# Patient Record
Sex: Female | Born: 1960 | Race: Black or African American | Hispanic: No | Smoking: Current every day smoker
Health system: Southern US, Community
[De-identification: ages and names within clinical notes are randomized; demographics above are authoritative.]

## PROBLEM LIST (undated history)

## (undated) DIAGNOSIS — Z87442 Personal history of urinary calculi: Secondary | ICD-10-CM

## (undated) DIAGNOSIS — F329 Major depressive disorder, single episode, unspecified: Secondary | ICD-10-CM

## (undated) DIAGNOSIS — M179 Osteoarthritis of knee, unspecified: Secondary | ICD-10-CM

## (undated) DIAGNOSIS — F32A Depression, unspecified: Secondary | ICD-10-CM

## (undated) DIAGNOSIS — Z8701 Personal history of pneumonia (recurrent): Secondary | ICD-10-CM

## (undated) DIAGNOSIS — M1710 Unilateral primary osteoarthritis, unspecified knee: Secondary | ICD-10-CM

## (undated) DIAGNOSIS — M2550 Pain in unspecified joint: Secondary | ICD-10-CM

## (undated) HISTORY — PX: BACK SURGERY: SHX140

---

## 1989-08-18 HISTORY — PX: ECTOPIC PREGNANCY SURGERY: SHX613

## 1997-09-29 ENCOUNTER — Inpatient Hospital Stay (HOSPITAL_COMMUNITY): Admission: AD | Admit: 1997-09-29 | Discharge: 1997-09-30 | Payer: Self-pay | Admitting: *Deleted

## 1997-11-08 ENCOUNTER — Inpatient Hospital Stay (HOSPITAL_COMMUNITY): Admission: AD | Admit: 1997-11-08 | Discharge: 1997-11-08 | Payer: Self-pay | Admitting: Obstetrics

## 1997-11-16 ENCOUNTER — Encounter: Admission: RE | Admit: 1997-11-16 | Discharge: 1998-02-14 | Payer: Self-pay | Admitting: Obstetrics

## 1997-12-04 ENCOUNTER — Inpatient Hospital Stay (HOSPITAL_COMMUNITY): Admission: AD | Admit: 1997-12-04 | Discharge: 1997-12-04 | Payer: Self-pay | Admitting: *Deleted

## 1997-12-29 ENCOUNTER — Inpatient Hospital Stay (HOSPITAL_COMMUNITY): Admission: AD | Admit: 1997-12-29 | Discharge: 1997-12-29 | Payer: Self-pay | Admitting: *Deleted

## 1998-01-03 ENCOUNTER — Ambulatory Visit (HOSPITAL_COMMUNITY): Admission: RE | Admit: 1998-01-03 | Discharge: 1998-01-03 | Payer: Self-pay | Admitting: Obstetrics

## 1998-12-20 ENCOUNTER — Inpatient Hospital Stay (HOSPITAL_COMMUNITY): Admission: AD | Admit: 1998-12-20 | Discharge: 1998-12-20 | Payer: Self-pay | Admitting: Obstetrics & Gynecology

## 1999-04-22 ENCOUNTER — Encounter: Payer: Self-pay | Admitting: Emergency Medicine

## 1999-04-22 ENCOUNTER — Emergency Department (HOSPITAL_COMMUNITY): Admission: EM | Admit: 1999-04-22 | Discharge: 1999-04-22 | Payer: Self-pay | Admitting: Emergency Medicine

## 2000-01-23 ENCOUNTER — Emergency Department (HOSPITAL_COMMUNITY): Admission: EM | Admit: 2000-01-23 | Discharge: 2000-01-23 | Payer: Self-pay | Admitting: Emergency Medicine

## 2000-03-09 ENCOUNTER — Emergency Department (HOSPITAL_COMMUNITY): Admission: EM | Admit: 2000-03-09 | Discharge: 2000-03-09 | Payer: Self-pay | Admitting: Emergency Medicine

## 2000-06-07 ENCOUNTER — Inpatient Hospital Stay (HOSPITAL_COMMUNITY): Admission: AD | Admit: 2000-06-07 | Discharge: 2000-06-07 | Payer: Self-pay | Admitting: Obstetrics

## 2000-09-20 ENCOUNTER — Emergency Department (HOSPITAL_COMMUNITY): Admission: EM | Admit: 2000-09-20 | Discharge: 2000-09-21 | Payer: Self-pay | Admitting: Internal Medicine

## 2001-06-25 ENCOUNTER — Emergency Department (HOSPITAL_COMMUNITY): Admission: EM | Admit: 2001-06-25 | Discharge: 2001-06-25 | Payer: Self-pay

## 2001-08-25 ENCOUNTER — Inpatient Hospital Stay (HOSPITAL_COMMUNITY): Admission: AD | Admit: 2001-08-25 | Discharge: 2001-08-25 | Payer: Self-pay | Admitting: Obstetrics

## 2001-09-25 ENCOUNTER — Encounter: Payer: Self-pay | Admitting: Emergency Medicine

## 2001-09-25 ENCOUNTER — Emergency Department (HOSPITAL_COMMUNITY): Admission: EM | Admit: 2001-09-25 | Discharge: 2001-09-25 | Payer: Self-pay | Admitting: Emergency Medicine

## 2001-10-11 ENCOUNTER — Emergency Department (HOSPITAL_COMMUNITY): Admission: EM | Admit: 2001-10-11 | Discharge: 2001-10-12 | Payer: Self-pay | Admitting: Emergency Medicine

## 2001-10-22 ENCOUNTER — Inpatient Hospital Stay (HOSPITAL_COMMUNITY): Admission: AD | Admit: 2001-10-22 | Discharge: 2001-10-22 | Payer: Self-pay | Admitting: *Deleted

## 2001-10-25 ENCOUNTER — Emergency Department (HOSPITAL_COMMUNITY): Admission: EM | Admit: 2001-10-25 | Discharge: 2001-10-25 | Payer: Self-pay | Admitting: Emergency Medicine

## 2001-10-25 ENCOUNTER — Encounter: Payer: Self-pay | Admitting: Emergency Medicine

## 2001-12-10 ENCOUNTER — Ambulatory Visit (HOSPITAL_COMMUNITY): Admission: RE | Admit: 2001-12-10 | Discharge: 2001-12-10 | Payer: Self-pay | Admitting: *Deleted

## 2001-12-14 ENCOUNTER — Emergency Department (HOSPITAL_COMMUNITY): Admission: EM | Admit: 2001-12-14 | Discharge: 2001-12-14 | Payer: Self-pay | Admitting: Emergency Medicine

## 2002-01-03 ENCOUNTER — Ambulatory Visit (HOSPITAL_COMMUNITY): Admission: RE | Admit: 2002-01-03 | Discharge: 2002-01-03 | Payer: Self-pay | Admitting: *Deleted

## 2002-01-23 ENCOUNTER — Inpatient Hospital Stay (HOSPITAL_COMMUNITY): Admission: AD | Admit: 2002-01-23 | Discharge: 2002-01-23 | Payer: Self-pay | Admitting: *Deleted

## 2002-03-09 ENCOUNTER — Ambulatory Visit (HOSPITAL_COMMUNITY): Admission: RE | Admit: 2002-03-09 | Discharge: 2002-03-09 | Payer: Self-pay | Admitting: *Deleted

## 2002-04-10 ENCOUNTER — Inpatient Hospital Stay (HOSPITAL_COMMUNITY): Admission: AD | Admit: 2002-04-10 | Discharge: 2002-04-10 | Payer: Self-pay | Admitting: *Deleted

## 2002-05-13 ENCOUNTER — Inpatient Hospital Stay (HOSPITAL_COMMUNITY): Admission: AD | Admit: 2002-05-13 | Discharge: 2002-05-13 | Payer: Self-pay | Admitting: *Deleted

## 2002-05-21 ENCOUNTER — Inpatient Hospital Stay (HOSPITAL_COMMUNITY): Admission: AD | Admit: 2002-05-21 | Discharge: 2002-05-23 | Payer: Self-pay | Admitting: *Deleted

## 2003-09-19 ENCOUNTER — Emergency Department (HOSPITAL_COMMUNITY): Admission: EM | Admit: 2003-09-19 | Discharge: 2003-09-19 | Payer: Self-pay | Admitting: Emergency Medicine

## 2003-11-19 ENCOUNTER — Emergency Department (HOSPITAL_COMMUNITY): Admission: EM | Admit: 2003-11-19 | Discharge: 2003-11-19 | Payer: Self-pay | Admitting: Emergency Medicine

## 2004-06-06 ENCOUNTER — Emergency Department (HOSPITAL_COMMUNITY): Admission: EM | Admit: 2004-06-06 | Discharge: 2004-06-07 | Payer: Self-pay | Admitting: Emergency Medicine

## 2005-05-23 ENCOUNTER — Emergency Department (HOSPITAL_COMMUNITY): Admission: EM | Admit: 2005-05-23 | Discharge: 2005-05-23 | Payer: Self-pay | Admitting: Emergency Medicine

## 2005-05-29 ENCOUNTER — Emergency Department (HOSPITAL_COMMUNITY): Admission: EM | Admit: 2005-05-29 | Discharge: 2005-05-29 | Payer: Self-pay | Admitting: Emergency Medicine

## 2007-11-17 ENCOUNTER — Emergency Department (HOSPITAL_COMMUNITY): Admission: EM | Admit: 2007-11-17 | Discharge: 2007-11-17 | Payer: Self-pay | Admitting: Emergency Medicine

## 2007-11-21 ENCOUNTER — Emergency Department (HOSPITAL_COMMUNITY): Admission: EM | Admit: 2007-11-21 | Discharge: 2007-11-21 | Payer: Self-pay | Admitting: Emergency Medicine

## 2008-06-30 ENCOUNTER — Emergency Department (HOSPITAL_COMMUNITY): Admission: EM | Admit: 2008-06-30 | Discharge: 2008-06-30 | Payer: Self-pay | Admitting: Family Medicine

## 2008-11-20 ENCOUNTER — Emergency Department (HOSPITAL_COMMUNITY): Admission: EM | Admit: 2008-11-20 | Discharge: 2008-11-20 | Payer: Self-pay | Admitting: Emergency Medicine

## 2009-01-05 ENCOUNTER — Emergency Department (HOSPITAL_COMMUNITY): Admission: EM | Admit: 2009-01-05 | Discharge: 2009-01-05 | Payer: Self-pay | Admitting: Emergency Medicine

## 2009-03-04 ENCOUNTER — Emergency Department (HOSPITAL_COMMUNITY): Admission: EM | Admit: 2009-03-04 | Discharge: 2009-03-05 | Payer: Self-pay | Admitting: Emergency Medicine

## 2009-04-09 ENCOUNTER — Emergency Department (HOSPITAL_COMMUNITY): Admission: EM | Admit: 2009-04-09 | Discharge: 2009-04-09 | Payer: Self-pay | Admitting: Emergency Medicine

## 2009-04-30 ENCOUNTER — Emergency Department (HOSPITAL_COMMUNITY): Admission: EM | Admit: 2009-04-30 | Discharge: 2009-04-30 | Payer: Self-pay | Admitting: Emergency Medicine

## 2009-05-02 ENCOUNTER — Emergency Department (HOSPITAL_COMMUNITY): Admission: EM | Admit: 2009-05-02 | Discharge: 2009-05-02 | Payer: Self-pay | Admitting: Emergency Medicine

## 2009-06-05 ENCOUNTER — Emergency Department (HOSPITAL_COMMUNITY): Admission: EM | Admit: 2009-06-05 | Discharge: 2009-06-05 | Payer: Self-pay | Admitting: Emergency Medicine

## 2009-06-07 ENCOUNTER — Emergency Department (HOSPITAL_COMMUNITY): Admission: EM | Admit: 2009-06-07 | Discharge: 2009-06-07 | Payer: Self-pay | Admitting: Emergency Medicine

## 2010-03-20 ENCOUNTER — Inpatient Hospital Stay (HOSPITAL_COMMUNITY): Admission: RE | Admit: 2010-03-20 | Discharge: 2010-03-25 | Payer: Self-pay | Admitting: Orthopedic Surgery

## 2010-03-20 HISTORY — PX: LUMBAR FUSION: SHX111

## 2010-04-06 ENCOUNTER — Emergency Department (HOSPITAL_COMMUNITY): Admission: EM | Admit: 2010-04-06 | Discharge: 2010-04-07 | Payer: Self-pay | Admitting: Emergency Medicine

## 2010-10-23 ENCOUNTER — Encounter: Payer: Self-pay | Admitting: Obstetrics and Gynecology

## 2010-10-31 LAB — URINALYSIS, ROUTINE W REFLEX MICROSCOPIC
Bilirubin Urine: NEGATIVE
Glucose, UA: NEGATIVE mg/dL
Ketones, ur: NEGATIVE mg/dL
Nitrite: NEGATIVE
Protein, ur: NEGATIVE mg/dL
Specific Gravity, Urine: 1.026 (ref 1.005–1.030)
Urobilinogen, UA: 1 mg/dL (ref 0.0–1.0)
pH: 7.5 (ref 5.0–8.0)

## 2010-10-31 LAB — URINE MICROSCOPIC-ADD ON

## 2010-11-02 LAB — SURGICAL PCR SCREEN: Staphylococcus aureus: NEGATIVE

## 2010-11-02 LAB — ABO/RH: ABO/RH(D): O POS

## 2010-11-02 LAB — TYPE AND SCREEN: Antibody Screen: NEGATIVE

## 2010-11-02 LAB — CBC
MCV: 97 fL (ref 78.0–100.0)
RDW: 14.1 % (ref 11.5–15.5)

## 2010-11-24 LAB — URINE MICROSCOPIC-ADD ON

## 2010-11-24 LAB — GC/CHLAMYDIA PROBE AMP, GENITAL: Chlamydia, DNA Probe: NEGATIVE

## 2010-11-24 LAB — POCT I-STAT, CHEM 8
Calcium, Ion: 1.16 mmol/L (ref 1.12–1.32)
Chloride: 108 mEq/L (ref 96–112)
Glucose, Bld: 88 mg/dL (ref 70–99)
Potassium: 3.6 mEq/L (ref 3.5–5.1)
Sodium: 145 mEq/L (ref 135–145)
TCO2: 26 mmol/L (ref 0–100)

## 2010-11-24 LAB — URINALYSIS, ROUTINE W REFLEX MICROSCOPIC
Glucose, UA: NEGATIVE mg/dL
Hgb urine dipstick: NEGATIVE
Urobilinogen, UA: 2 mg/dL — ABNORMAL HIGH (ref 0.0–1.0)
pH: 6 (ref 5.0–8.0)

## 2010-11-24 LAB — URINE CULTURE

## 2010-11-24 LAB — DIFFERENTIAL
Basophils Absolute: 0 10*3/uL (ref 0.0–0.1)
Eosinophils Absolute: 0.3 10*3/uL (ref 0.0–0.7)
Eosinophils Relative: 3 % (ref 0–5)
Lymphs Abs: 3.4 10*3/uL (ref 0.7–4.0)
Neutro Abs: 4.2 10*3/uL (ref 1.7–7.7)

## 2010-11-24 LAB — WET PREP, GENITAL
Trich, Wet Prep: NONE SEEN
Yeast Wet Prep HPF POC: NONE SEEN

## 2010-11-24 LAB — CBC
Hemoglobin: 11.8 g/dL — ABNORMAL LOW (ref 12.0–15.0)
MCV: 96.8 fL (ref 78.0–100.0)
RDW: 13.9 % (ref 11.5–15.5)
WBC: 8.6 10*3/uL (ref 4.0–10.5)

## 2011-05-13 LAB — CBC
MCHC: 34
Platelets: 215
RDW: 13.1

## 2011-05-13 LAB — POCT I-STAT, CHEM 8
Chloride: 107
HCT: 35 — ABNORMAL LOW
Hemoglobin: 11.9 — ABNORMAL LOW
Potassium: 3.5

## 2011-05-13 LAB — DIFFERENTIAL
Basophils Absolute: 0.1
Basophils Relative: 0
Neutro Abs: 7.2
Neutrophils Relative %: 60

## 2011-05-24 ENCOUNTER — Emergency Department (HOSPITAL_COMMUNITY)
Admission: EM | Admit: 2011-05-24 | Discharge: 2011-05-24 | Disposition: A | Discharge status: Home / Self Care | Payer: Self-pay | Attending: Emergency Medicine | Admitting: Emergency Medicine

## 2011-05-24 DIAGNOSIS — Z0389 Encounter for observation for other suspected diseases and conditions ruled out: Secondary | ICD-10-CM | POA: Insufficient documentation

## 2012-02-13 ENCOUNTER — Encounter (HOSPITAL_COMMUNITY): Payer: Self-pay

## 2012-02-13 ENCOUNTER — Emergency Department (INDEPENDENT_AMBULATORY_CARE_PROVIDER_SITE_OTHER)
Admission: EM | Admit: 2012-02-13 | Discharge: 2012-02-13 | Disposition: A | Discharge status: Home / Self Care | Payer: Self-pay | Source: Home / Self Care | Attending: Family Medicine | Admitting: Family Medicine

## 2012-02-13 DIAGNOSIS — M25569 Pain in unspecified knee: Secondary | ICD-10-CM

## 2012-02-13 DIAGNOSIS — N898 Other specified noninflammatory disorders of vagina: Secondary | ICD-10-CM

## 2012-02-13 LAB — POCT URINALYSIS DIP (DEVICE)
Glucose, UA: NEGATIVE mg/dL
Ketones, ur: NEGATIVE mg/dL
Leukocytes, UA: NEGATIVE
Specific Gravity, Urine: >=1.03 (ref 1.005–1.030)
Urobilinogen, UA: 0.2 mg/dL (ref 0.0–1.0)

## 2012-02-13 LAB — WET PREP, GENITAL
Trich, Wet Prep: NONE SEEN
WBC, Wet Prep HPF POC: NONE SEEN
Yeast Wet Prep HPF POC: NONE SEEN

## 2012-02-13 NOTE — Discharge Instructions (Signed)
Follow up with your orthopedist as scheduled about your knee.  In the meantime if your knee gets worse, or shows signs of infection, please come back here or go to the ER.   Knee Pain The knee is the complex joint between your thigh and your lower leg. It is made up of bones, tendons, ligaments, and cartilage. The bones that make up the knee are:  The femur in the thigh.   The tibia and fibula in the lower leg.   The patella or kneecap riding in the groove on the lower femur.  CAUSES  Knee pain is a common complaint with many causes. A few of these causes are:  Injury, such as:   A ruptured ligament or tendon injury.   Torn cartilage.   Medical conditions, such as:   Gout   Arthritis   Infections   Overuse, over training or overdoing a physical activity.  Knee pain can be minor or severe. Knee pain can accompany debilitating injury. Minor knee problems often respond well to self-care measures or get well on their own. More serious injuries may need medical intervention or even surgery. SYMPTOMS The knee is complex. Symptoms of knee problems can vary widely. Some of the problems are:  Pain with movement and weight bearing.   Swelling and tenderness.   Buckling of the knee.   Inability to straighten or extend your knee.   Your knee locks and you cannot straighten it.   Warmth and redness with pain and fever.   Deformity or dislocation of the kneecap.  DIAGNOSIS  Determining what is wrong may be very straight forward such as when there is an injury. It can also be challenging because of the complexity of the knee. Tests to make a diagnosis may include:  Your caregiver taking a history and doing a physical exam.   Routine X-rays can be used to rule out other problems. X-rays will not reveal a cartilage tear. Some injuries of the knee can be diagnosed by:   Arthroscopy a surgical technique by which a small video camera is inserted through tiny incisions on the sides of  the knee. This procedure is used to examine and repair internal knee joint problems. Tiny instruments can be used during arthroscopy to repair the torn knee cartilage (meniscus).   Arthrography is a radiology technique. A contrast liquid is directly injected into the knee joint. Internal structures of the knee joint then become visible on X-ray film.   An MRI scan is a non x-ray radiology procedure in which magnetic fields and a computer produce two- or three-dimensional images of the inside of the knee. Cartilage tears are often visible using an MRI scanner. MRI scans have largely replaced arthrography in diagnosing cartilage tears of the knee.   Blood work.   Examination of the fluid that helps to lubricate the knee joint (synovial fluid). This is done by taking a sample out using a needle and a syringe.  TREATMENT The treatment of knee problems depends on the cause. Some of these treatments are:  Depending on the injury, proper casting, splinting, surgery or physical therapy care will be needed.   Give yourself adequate recovery time. Do not overuse your joints. If you begin to get sore during workout routines, back off. Slow down or do fewer repetitions.   For repetitive activities such as cycling or running, maintain your strength and nutrition.   Alternate muscle groups. For example if you are a weight lifter, work the upper body  on one day and the lower body the next.   Either tight or weak muscles do not give the proper support for your knee. Tight or weak muscles do not absorb the stress placed on the knee joint. Keep the muscles surrounding the knee strong.   Take care of mechanical problems.   If you have flat feet, orthotics or special shoes may help. See your caregiver if you need help.   Arch supports, sometimes with wedges on the inner or outer aspect of the heel, can help. These can shift pressure away from the side of the knee most bothered by osteoarthritis.   A brace  called an "unloader" brace also may be used to help ease the pressure on the most arthritic side of the knee.   If your caregiver has prescribed crutches, braces, wraps or ice, use as directed. The acronym for this is PRICE. This means protection, rest, ice, compression and elevation.   Nonsteroidal anti-inflammatory drugs (NSAID's), can help relieve pain. But if taken immediately after an injury, they may actually increase swelling. Take NSAID's with food in your stomach. Stop them if you develop stomach problems. Do not take these if you have a history of ulcers, stomach pain or bleeding from the bowel. Do not take without your caregiver's approval if you have problems with fluid retention, heart failure, or kidney problems.   For ongoing knee problems, physical therapy may be helpful.   Glucosamine and chondroitin are over-the-counter dietary supplements. Both may help relieve the pain of osteoarthritis in the knee. These medicines are different from the usual anti-inflammatory drugs. Glucosamine may decrease the rate of cartilage destruction.   Injections of a corticosteroid drug into your knee joint may help reduce the symptoms of an arthritis flare-up. They may provide pain relief that lasts a few months. You may have to wait a few months between injections. The injections do have a small increased risk of infection, water retention and elevated blood sugar levels.   Hyaluronic acid injected into damaged joints may ease pain and provide lubrication. These injections may work by reducing inflammation. A series of shots may give relief for as long as 6 months.   Topical painkillers. Applying certain ointments to your skin may help relieve the pain and stiffness of osteoarthritis. Ask your pharmacist for suggestions. Many over the-counter products are approved for temporary relief of arthritis pain.   In some countries, doctors often prescribe topical NSAID's for relief of chronic conditions such  as arthritis and tendinitis. A review of treatment with NSAID creams found that they worked as well as oral medications but without the serious side effects.  PREVENTION  Maintain a healthy weight. Extra pounds put more strain on your joints.   Get strong, stay limber. Weak muscles are a common cause of knee injuries. Stretching is important. Include flexibility exercises in your workouts.   Be smart about exercise. If you have osteoarthritis, chronic knee pain or recurring injuries, you may need to change the way you exercise. This does not mean you have to stop being active. If your knees ache after jogging or playing basketball, consider switching to swimming, water aerobics or other low-impact activities, at least for a few days a week. Sometimes limiting high-impact activities will provide relief.   Make sure your shoes fit well. Choose footwear that is right for your sport.   Protect your knees. Use the proper gear for knee-sensitive activities. Use kneepads when playing volleyball or laying carpet. Buckle your seat belt  every time you drive. Most shattered kneecaps occur in car accidents.   Rest when you are tired.  SEEK MEDICAL CARE IF:  You have knee pain that is continual and does not seem to be getting better.  SEEK IMMEDIATE MEDICAL CARE IF:  Your knee joint feels hot to the touch and you have a high fever. MAKE SURE YOU:   Understand these instructions.   Will watch your condition.   Will get help right away if you are not doing well or get worse.  Document Released: 06/01/2007 Document Revised: 07/24/2011 Document Reviewed: 06/01/2007 [facility] Patient Information 2012 [place], [place].

## 2012-02-13 NOTE — ED Notes (Signed)
pts says her home telephone number is [phone].

## 2012-02-13 NOTE — ED Notes (Signed)
States she noted heavy vaginal d/c this AM when she was taking her shower; c/o pain in lower back

## 2012-02-14 LAB — GC/CHLAMYDIA PROBE AMP, GENITAL: GC Probe Amp, Genital: NEGATIVE

## 2012-02-15 NOTE — ED Provider Notes (Signed)
History     CSN: [id number]  Arrival date & time 02/13/12  1810   First MD Initiated Contact with Patient 02/13/12 1941      Chief Complaint  Patient presents with  . Vaginitis    (Consider location/radiation/quality/duration/timing/severity/associated sxs/prior treatment) HPI Comments: Pt with chronic back pain, thinks she has changed the way she walks to accommodate back pain, causing pain to R knee.  Denies injury or fall, first noticed slight pain in knee 2 weeks ago, has been progressively getting worse and in last few days she has noticed it is swollen.  No redness, no fever.   Pt reports sexual activity is rare for her, did have sex last night; previous activity was about 3 months ago.  Worried she may have STD because this morning when she got into shower, she noticed a "gush" of discharge, "like my water broke but I'm not pregnant".  Describes discharge as thin and white.   Patient is a 51 y.o. female presenting with knee pain and vaginal discharge. The history is provided by the patient.  Knee Pain This is a new problem. Episode onset: 2 weeks ago. The problem occurs constantly. The problem has been gradually worsening. Pertinent negatives include no abdominal pain. The symptoms are aggravated by bending and walking. Nothing relieves the symptoms. She has tried nothing for the symptoms.  Vaginal Discharge This is a new problem. The current episode started 6 to 12 hours ago. Episode frequency: x1 today. The problem has been resolved. Pertinent negatives include no abdominal pain. Nothing aggravates the symptoms. Nothing relieves the symptoms. She has tried nothing for the symptoms.    History reviewed. No pertinent past medical history.  History reviewed. No pertinent past surgical history.  History reviewed. No pertinent family history.  History  Substance Use Topics  . Smoking status: Current Some Day Smoker  . Smokeless tobacco: Not on file  . Alcohol Use: No    OB  History    Grav Para Term Preterm Abortions TAB SAB Ect Mult Living                  Review of Systems  Constitutional: Negative for fever and chills.  Gastrointestinal: Negative for nausea, vomiting and abdominal pain.  Genitourinary: Positive for vaginal discharge. Negative for dysuria, urgency, frequency, flank pain, vaginal bleeding, genital sores and vaginal pain.       Pt postmenopausal; menses ceased around age 56.  Musculoskeletal: Positive for joint swelling.       Chronic back pain, R knee pain; no injury or fall  Skin: Negative for color change.  Neurological: Negative for weakness and numbness.    Allergies  Review of patient's allergies indicates no known allergies.  Home Medications  No current outpatient prescriptions on file.  BP 148/99  Pulse 84  Temp 97.4 F (36.3 C) (Oral)  Resp 18  SpO2 100%  Physical Exam  Constitutional: She appears well-developed and well-nourished. No distress.       obese  Cardiovascular: Normal rate and regular rhythm.   Pulmonary/Chest: Effort normal and breath sounds normal.  Abdominal: Soft. Bowel sounds are normal. She exhibits no distension. There is no tenderness. There is no rigidity, no rebound, no guarding and no CVA tenderness.  Genitourinary: There is no rash, tenderness or lesion on the right labia. There is no rash, tenderness or lesion on the left labia. Cervix exhibits no motion tenderness, no discharge and no friability. Right adnexum displays no mass and no tenderness. Left  adnexum displays no mass and no tenderness. No erythema, tenderness or bleeding around the vagina. No foreign body around the vagina. No signs of injury around the vagina. No vaginal discharge found.       Uterus and ovaries non palpable, likely due to body habitus.   Musculoskeletal:       Right knee: She exhibits swelling. She exhibits normal range of motion, no deformity, no erythema and no bony tenderness. tenderness found.  Lymphadenopathy:        Right: No inguinal adenopathy present.       Left: No inguinal adenopathy present.  Skin: Skin is warm and dry. No abrasion, no bruising and no rash noted. No erythema.       Slight warmth of medial R knee compared to L.  No evidence infection.     ED Course  Procedures (including critical care time)  Labs Reviewed  POCT URINALYSIS DIP (DEVICE) - Abnormal; Notable for the following:    Bilirubin Urine SMALL (*)     Hgb urine dipstick SMALL (*)     All other components within normal limits  WET PREP, GENITAL - Abnormal; Notable for the following:    Clue Cells Wet Prep HPF POC FEW (*)     All other components within normal limits  GC/CHLAMYDIA PROBE AMP, GENITAL   No results found.   1. Vaginal Discharge   2. Knee pain       MDM  Discussed with Dr. Alfonse Ras.  Likely R knee effusion. Pt refused knee xray and blood work, stating "I think this is because of my back, so I'm going to talk to my orthopedist about it when I see him this week so that the cost will be covered by worker's comp".  Reviewed with pt concerning sx to watch for in her knee and to go to the ER if she develops any.    No discharge noted in vagina.  Given pt's sexual history, STD seems unlikely.  Probably semen and cervical fluids retained in vagina post-coitus yesterday, and they discharged when she stood in shower.          Cathlyn Parsons, NP 02/15/12 1119

## 2012-02-17 NOTE — ED Provider Notes (Signed)
Medical screening examination/treatment/procedure(s) were performed by non-physician practitioner and as supervising physician I was immediately available for consultation/collaboration.   [facility]; MD   Sharin Grave, MD 02/17/12 [phone]

## 2012-03-03 ENCOUNTER — Emergency Department (HOSPITAL_COMMUNITY): Payer: Medicare Other

## 2012-03-03 ENCOUNTER — Encounter (HOSPITAL_COMMUNITY): Payer: Self-pay

## 2012-03-03 ENCOUNTER — Emergency Department (HOSPITAL_COMMUNITY)
Admission: EM | Admit: 2012-03-03 | Discharge: 2012-03-03 | Disposition: A | Discharge status: Home / Self Care | Payer: Medicare Other | Attending: Emergency Medicine | Admitting: Emergency Medicine

## 2012-03-03 DIAGNOSIS — F17200 Nicotine dependence, unspecified, uncomplicated: Secondary | ICD-10-CM | POA: Insufficient documentation

## 2012-03-03 DIAGNOSIS — M795 Residual foreign body in soft tissue: Secondary | ICD-10-CM

## 2012-03-03 DIAGNOSIS — IMO0002 Reserved for concepts with insufficient information to code with codable children: Secondary | ICD-10-CM | POA: Insufficient documentation

## 2012-03-03 MED ORDER — LIDOCAINE HCL 2 % IJ SOLN
10.0000 mL | Freq: Once | INTRAMUSCULAR | Status: AC
  Administered 2012-03-03: 20 mg via INTRADERMAL
  Filled 2012-03-03: qty 1

## 2012-03-03 MED ORDER — IBUPROFEN 600 MG PO TABS: 600.0000 mg | ORAL_TABLET | Freq: Four times a day (QID) | ORAL | Status: AC | PRN

## 2012-03-03 MED ORDER — AMOXICILLIN-POT CLAVULANATE 250-62.5 MG/5ML PO SUSR: 250.0000 mg | Freq: Three times a day (TID) | ORAL | Status: AC

## 2012-03-03 MED ORDER — TETANUS-DIPHTH-ACELL PERTUSSIS 5-2.5-18.5 LF-MCG/0.5 IM SUSP
0.5000 mL | Freq: Once | INTRAMUSCULAR | Status: AC
  Administered 2012-03-03: 0.5 mL via INTRAMUSCULAR
  Filled 2012-03-03: qty 0.5

## 2012-03-03 MED ORDER — OXYCODONE-ACETAMINOPHEN 5-325 MG PO TABS: 2.0000 | ORAL_TABLET | ORAL | Status: AC | PRN

## 2012-03-03 NOTE — ED Notes (Signed)
Pt reports stepping on something July 4th, pt presents w/swelling and black area to the bottom of her (L) foot/arch. Pt is unsure what she stepped on.

## 2012-03-03 NOTE — ED Provider Notes (Signed)
History     CSN: [id number]  Arrival date & time 03/03/12  7829   First MD Initiated Contact with Patient 03/03/12 [phone]      Chief Complaint  Patient presents with  . Foot Pain    (Consider location/radiation/quality/duration/timing/severity/associated sxs/prior treatment) Patient is a 51 y.o. female presenting with lower extremity pain. The history is provided by the patient. No language interpreter was used.  Foot Pain The current episode started in the past 7 days. The problem occurs daily. The problem has been gradually worsening. Pertinent negatives include no chills, fever, nausea, neck pain, vomiting or weakness. The symptoms are aggravated by walking. She has tried nothing for the symptoms.   51 year old female here today with complaint of L foot pain. States on July 4th she stepped on something and since then the pain has gotten worse with swelling.  States that she thinks something is in her foot. Edema noted no erythema or drainage.   Patient moves foot with no difficulty.  2+ pedal pulse.    History reviewed. No pertinent past medical history.  Past Surgical History  Procedure Date  . Back surgery     History reviewed. No pertinent family history.  History  Substance Use Topics  . Smoking status: Current Some Day Smoker  . Smokeless tobacco: Not on file  . Alcohol Use: No    OB History    Grav Para Term Preterm Abortions TAB SAB Ect Mult Living                  Review of Systems  Constitutional: Negative.  Negative for fever and chills.  HENT: Negative.  Negative for neck pain and neck stiffness.   Eyes: Negative.   Respiratory: Negative.   Cardiovascular: Negative.   Gastrointestinal: Negative.  Negative for nausea and vomiting.  Musculoskeletal: Positive for gait problem. Negative for back pain.       L foot pain  Neurological: Negative.  Negative for weakness.  Psychiatric/Behavioral: Negative.   All other systems reviewed and are  negative.    Allergies  Review of patient's allergies indicates no known allergies.  Home Medications  No current outpatient prescriptions on file.  BP 144/93  Pulse 78  Temp 98.4 F (36.9 C) (Oral)  Resp 18  SpO2 100%  Physical Exam  Nursing note and vitals reviewed. Constitutional: She is oriented to person, place, and time. She appears well-developed and well-nourished.  HENT:  Head: Normocephalic.  Eyes: Conjunctivae and EOM are normal. Pupils are equal, round, and reactive to light.  Neck: Normal range of motion. Neck supple.  Cardiovascular: Normal rate.   Pulmonary/Chest: Effort normal.  Abdominal: Soft.  Musculoskeletal: Normal range of motion.       L foot edema to the dorsal mid foot with visible foreign object  Neurological: She is alert and oriented to person, place, and time.  Skin: Skin is warm and dry.  Psychiatric: She has a normal mood and affect.    ED Course  Procedures (including critical care time)  Labs Reviewed - No data to display No results found.   No diagnosis found.    MDM  Attempted to remove foreign object x 2 from L foot.    Patient unable to hold still or receive enough lidocaine for sufficient anesthesia for the procedure.  Tetanus updated.  Patient wants to follow up with her orthopedic.  Rx for pain meds and antibiotics.  Crutches provided.  Shared visit with Dr. Preston Fleeting.  Remi Haggard, NP 03/03/12 1016

## 2012-03-03 NOTE — ED Notes (Signed)
Patient transported to X-ray 

## 2012-03-03 NOTE — ED Provider Notes (Signed)
51 year old female with a foreign body in her left foot for the last 2 weeks. It is visible on x-ray and images were viewed by me. And Crawford NP had initially tried to move the foreign body but was unable to grasp it. I went in to I attempted to remove the foreign body. Anesthetic had worn off at that point and patient was refusing to every injection of anesthetic and refusing attempt to remove the foreign body. Accordingly, she will be referred to orthopedics. She seems to get an adequate anesthesia from the local anesthetic and may need sedation and/or general anesthesia to get the foreign body extracted.  Dione Booze, MD 03/03/12 [phone]

## 2012-03-03 NOTE — Progress Notes (Signed)
Orthopedic Tech Progress Note Patient Details:  Martha Buck October 04, 1960 [id number]  Ortho Devices Type of Ortho Device: Crutches Ortho Device/Splint Interventions: Application   [name] 03/03/2012, 8:26 AM

## 2012-03-03 NOTE — ED Notes (Signed)
Pt reports L foot pain 

## 2012-03-05 NOTE — ED Provider Notes (Signed)
Medical screening examination/treatment/procedure(s) were conducted as a shared visit with non-physician practitioner(s) and myself.  I personally evaluated the patient during the encounter   Jarrett Chicoine, MD 03/05/12 1432 

## 2012-05-02 ENCOUNTER — Encounter (HOSPITAL_COMMUNITY): Payer: Self-pay

## 2012-05-02 ENCOUNTER — Emergency Department (INDEPENDENT_AMBULATORY_CARE_PROVIDER_SITE_OTHER)
Admission: EM | Admit: 2012-05-02 | Discharge: 2012-05-02 | Disposition: A | Discharge status: Home / Self Care | Payer: Medicare Other | Source: Home / Self Care | Attending: Family Medicine | Admitting: Family Medicine

## 2012-05-02 DIAGNOSIS — T63441A Toxic effect of venom of bees, accidental (unintentional), initial encounter: Secondary | ICD-10-CM

## 2012-05-02 DIAGNOSIS — T6391XA Toxic effect of contact with unspecified venomous animal, accidental (unintentional), initial encounter: Secondary | ICD-10-CM

## 2012-05-02 MED ORDER — KETOROLAC TROMETHAMINE 30 MG/ML IJ SOLN
30.0000 mg | Freq: Once | INTRAMUSCULAR | Status: AC
  Administered 2012-05-02: 30 mg via INTRAMUSCULAR

## 2012-05-02 MED ORDER — KETOROLAC TROMETHAMINE 30 MG/ML IJ SOLN
INTRAMUSCULAR | Status: AC
  Filled 2012-05-02: qty 1

## 2012-05-02 NOTE — ED Notes (Addendum)
Pt was stung by a bee on lt middle finger 20 minutes ago, swollen and throbbing.  No sob or difficulty swallowing.

## 2012-05-02 NOTE — ED Provider Notes (Signed)
History     CSN: [id number]  Arrival date & time 05/02/12  1345   First MD Initiated Contact with Patient 05/02/12 1352      Chief Complaint  Patient presents with  . Insect Bite    (Consider location/radiation/quality/duration/timing/severity/associated sxs/prior treatment) Patient is a 51 y.o. female presenting with hand pain. The history is provided by the patient.  Hand Pain This is a new problem. The current episode started less than 1 hour ago (bee sting to finger--pain and local swelling, when taking out garbage.). The problem has not changed since onset.Pertinent negatives include no chest pain and no shortness of breath. Nothing aggravates the symptoms.    History reviewed. No pertinent past medical history.  Past Surgical History  Procedure Date  . Back surgery     History reviewed. No pertinent family history.  History  Substance Use Topics  . Smoking status: Current Some Day Smoker  . Smokeless tobacco: Not on file  . Alcohol Use: No    OB History    Grav Para Term Preterm Abortions TAB SAB Ect Mult Living                  Review of Systems  Constitutional: Negative.   Respiratory: Negative for shortness of breath.   Cardiovascular: Negative for chest pain.  Musculoskeletal: Positive for joint swelling.  Skin: Positive for wound.    Allergies  Review of patient's allergies indicates no known allergies.  Home Medications   Current Outpatient Rx  Name Route Sig Dispense Refill  . DULOXETINE HCL 60 MG PO CPEP Oral Take 60 mg by mouth 2 (two) times daily.    Marland Kitchen METHOCARBAMOL 500 MG PO TABS Oral Take 500 mg by mouth 2 (two) times daily.      BP 114/65  Pulse 70  Temp 98.2 F (36.8 C) (Oral)  Resp 20  SpO2 99%  LMP 04/26/2012  Physical Exam  Nursing note and vitals reviewed. Constitutional: She is oriented to person, place, and time. She appears well-developed and well-nourished.  Cardiovascular: Normal rate, normal heart sounds and intact  distal pulses.   Pulmonary/Chest: Breath sounds normal.  Musculoskeletal: She exhibits tenderness.       Local sts to distal phalanx of lmf.otherwise neg.  Neurological: She is alert and oriented to person, place, and time.  Skin: Skin is warm and dry. No rash noted.    ED Course  Procedures (including critical care time)  Labs Reviewed - No data to display No results found.   1. Local reaction to bee sting       MDM          Linna Hoff, MD 05/02/12 1455

## 2012-06-21 ENCOUNTER — Encounter (HOSPITAL_COMMUNITY): Payer: Self-pay | Admitting: Emergency Medicine

## 2012-06-21 ENCOUNTER — Emergency Department (HOSPITAL_COMMUNITY)
Admission: EM | Admit: 2012-06-21 | Discharge: 2012-06-21 | Disposition: A | Discharge status: Home / Self Care | Payer: Medicare Other | Attending: Emergency Medicine | Admitting: Emergency Medicine

## 2012-06-21 DIAGNOSIS — J020 Streptococcal pharyngitis: Secondary | ICD-10-CM | POA: Insufficient documentation

## 2012-06-21 DIAGNOSIS — F17200 Nicotine dependence, unspecified, uncomplicated: Secondary | ICD-10-CM | POA: Insufficient documentation

## 2012-06-21 MED ORDER — LIDOCAINE VISCOUS 2 % MT SOLN
20.0000 mL | Freq: Once | OROMUCOSAL | Status: AC
  Administered 2012-06-21: 20 mL via OROMUCOSAL
  Filled 2012-06-21: qty 15

## 2012-06-21 MED ORDER — DEXAMETHASONE 1 MG/ML PO CONC
10.0000 mg | Freq: Once | ORAL | Status: AC
  Administered 2012-06-21: 10 mg via ORAL
  Filled 2012-06-21: qty 10

## 2012-06-21 MED ORDER — ACETAMINOPHEN 160 MG/5ML PO SOLN
650.0000 mg | Freq: Once | ORAL | Status: AC
  Administered 2012-06-21: 650 mg via ORAL

## 2012-06-21 MED ORDER — PENICILLIN G BENZATHINE 1200000 UNIT/2ML IM SUSP
1.2000 10*6.[IU] | Freq: Once | INTRAMUSCULAR | Status: AC
  Administered 2012-06-21: 1.2 10*6.[IU] via INTRAMUSCULAR
  Filled 2012-06-21: qty 2

## 2012-06-21 NOTE — ED Notes (Signed)
Pt c/o sore throat with fever x 5 days; pt sts unable to eat due to pain

## 2012-06-21 NOTE — ED Provider Notes (Signed)
History   This chart was scribed for Gerhard Munch, MD by Gerlean Ren. This patient was seen in room TR10C/TR10C and the patient's care was started at 9:28 PM .   CSN: [id number]  Arrival date & time 06/21/12  1832   None     Chief Complaint  Patient presents with  . Sore Throat    (Consider location/radiation/quality/duration/timing/severity/associated sxs/prior treatment) The history is provided by the patient. No language interpreter was used.   Martha Buck is a 51 y.o. female who presents to the Emergency Department complaining of 4 days of constant, gradually worsening sore throat causing decreased food intake due to pain swallowing.  Pt reports associated cough and fever as high as 102.  Pt denies dizziness and light-headedness.  Pt has no h/o chronic medical conditions.  History reviewed. No pertinent past medical history.  Past Surgical History  Procedure Date  . Back surgery     History reviewed. No pertinent family history.  History  Substance Use Topics  . Smoking status: Current Some Day Smoker  . Smokeless tobacco: Not on file  . Alcohol Use: No    No OB history provided.  Review of Systems  Constitutional: Positive for fever.       Per HPI, otherwise negative  HENT: Positive for sore throat.        Per HPI, otherwise negative  Eyes: Negative.   Respiratory:       Per HPI, otherwise negative  Cardiovascular:       Per HPI, otherwise negative  Gastrointestinal: Negative for vomiting.  Genitourinary: Negative.   Musculoskeletal:       Per HPI, otherwise negative  Skin: Negative.   Neurological: Negative for syncope.    Allergies  Review of patient's allergies indicates no known allergies.  Home Medications   Current Outpatient Rx  Name  Route  Sig  Dispense  Refill  . DULOXETINE HCL 60 MG PO CPEP   Oral   Take 60 mg by mouth 2 (two) times daily.         Marland Kitchen HYDROCODONE-ACETAMINOPHEN 5-325 MG PO TABS   Oral   Take 1 tablet by mouth  every 6 (six) hours as needed. For pain         . METHOCARBAMOL 500 MG PO TABS   Oral   Take 500 mg by mouth 2 (two) times daily.           BP 109/77  Pulse 113  Temp 99 F (37.2 C) (Oral)  Resp 20  SpO2 97%  LMP 04/26/2012  Physical Exam  Nursing note and vitals reviewed. Constitutional: She is oriented to person, place, and time. She appears well-developed and well-nourished. No distress.  HENT:  Head: Normocephalic and atraumatic.       Trismus.  Cannot visualize tonsils.  No periauricular adenopathy.    Eyes: Conjunctivae normal and EOM are normal.  Neck: No tracheal deviation present.       Anterior cervical adenopathy.  Cardiovascular: Normal rate and regular rhythm.   Pulmonary/Chest: Effort normal and breath sounds normal. No stridor. No respiratory distress.  Abdominal: She exhibits no distension.  Musculoskeletal: She exhibits no edema.  Neurological: She is alert and oriented to person, place, and time. No cranial nerve deficit.  Skin: Skin is warm and dry.  Psychiatric: She has a normal mood and affect.    ED Course  Procedures (including critical care time) DIAGNOSTIC STUDIES: Oxygen Saturation is 97% on room air, adequate by my interpretation.  COORDINATION OF CARE: 9:31 PM- Patient informed of clinical course, understands medical decision-making process, and agrees with plan.  Informed pt of positive strep test.        Labs Reviewed  RAPID STREP SCREEN - Abnormal; Notable for the following:    Streptococcus, Group A Screen (Direct) POSITIVE (*)     All other components within normal limits   No results found.   No diagnosis found.    MDM  I personally performed the services described in this documentation, which was scribed in my presence. The recorded information has been reviewed and considered.  This generally well-appearing presents with several days of fever.  The patient also has dysphagia, though no dyspnea.  On exam she is in  no distress.  The patient's temperature decreased appreciably following Tylenol provision and following administration of intramuscular penicillin, steroids, topical relief, she was discharged in stable condition.  Absent rash, distress, with resolution of her fever there is low suspicion of scarlet fever, or systemic progression.  We discussed the need for close ongoing primary care.  Gerhard Munch, MD 06/21/12 2219

## 2012-06-24 ENCOUNTER — Emergency Department (HOSPITAL_COMMUNITY)
Admission: EM | Admit: 2012-06-24 | Discharge: 2012-06-24 | Disposition: A | Discharge status: Home / Self Care | Payer: Medicare Other | Attending: Emergency Medicine | Admitting: Emergency Medicine

## 2012-06-24 ENCOUNTER — Encounter (HOSPITAL_COMMUNITY): Payer: Self-pay | Admitting: Emergency Medicine

## 2012-06-24 ENCOUNTER — Emergency Department (HOSPITAL_COMMUNITY): Payer: Medicare Other

## 2012-06-24 DIAGNOSIS — F17200 Nicotine dependence, unspecified, uncomplicated: Secondary | ICD-10-CM | POA: Insufficient documentation

## 2012-06-24 DIAGNOSIS — J392 Other diseases of pharynx: Secondary | ICD-10-CM | POA: Insufficient documentation

## 2012-06-24 DIAGNOSIS — J391 Other abscess of pharynx: Secondary | ICD-10-CM

## 2012-06-24 LAB — BASIC METABOLIC PANEL
BUN: 23 mg/dL (ref 6–23)
CO2: 26 mEq/L (ref 19–32)
Calcium: 8.5 mg/dL (ref 8.4–10.5)
Chloride: 102 mEq/L (ref 96–112)
Creatinine, Ser: 0.78 mg/dL (ref 0.50–1.10)
GFR calc Af Amer: >90 mL/min (ref >90)
GFR calc non Af Amer: >90 mL/min (ref >90)
Glucose, Bld: 99 mg/dL (ref 70–99)
Potassium: 3.2 mEq/L — ABNORMAL LOW (ref 3.5–5.1)
Sodium: 139 mEq/L (ref 135–145)

## 2012-06-24 LAB — CBC WITH DIFFERENTIAL/PLATELET
Basophils Absolute: 0.1 10*3/uL (ref 0.0–0.1)
Lymphocytes Relative: 22 % (ref 12–46)
Monocytes Relative: 10 % (ref 3–12)
Neutrophils Relative %: 67 % (ref 43–77)
Platelets: 238 10*3/uL (ref 150–400)
RDW: 13.6 % (ref 11.5–15.5)
WBC: 12.4 10*3/uL — ABNORMAL HIGH (ref 4.0–10.5)

## 2012-06-24 MED ORDER — FENTANYL CITRATE 0.05 MG/ML IJ SOLN
100.0000 ug | Freq: Once | INTRAMUSCULAR | Status: AC
  Administered 2012-06-24: 100 ug via INTRAVENOUS
  Filled 2012-06-24: qty 2

## 2012-06-24 MED ORDER — PREDNISOLONE 15 MG/5ML PO SYRP: ORAL_SOLUTION | ORAL | Status: DC

## 2012-06-24 MED ORDER — CEFTRIAXONE SODIUM 2 G IJ SOLR
2.0000 g | Freq: Once | INTRAMUSCULAR | Status: AC
  Administered 2012-06-24: 2 g via INTRAVENOUS
  Filled 2012-06-24: qty 2

## 2012-06-24 MED ORDER — IOHEXOL 300 MG/ML  SOLN
75.0000 mL | Freq: Once | INTRAMUSCULAR | Status: AC | PRN
  Administered 2012-06-24: 75 mL via INTRAVENOUS

## 2012-06-24 MED ORDER — SODIUM CHLORIDE 0.9 % IV BOLUS (SEPSIS)
500.0000 mL | Freq: Once | INTRAVENOUS | Status: AC
  Administered 2012-06-24: 500 mL via INTRAVENOUS

## 2012-06-24 MED ORDER — ONDANSETRON HCL 4 MG/2ML IJ SOLN
4.0000 mg | Freq: Once | INTRAMUSCULAR | Status: AC
  Administered 2012-06-24: 4 mg via INTRAVENOUS
  Filled 2012-06-24: qty 2

## 2012-06-24 MED ORDER — SODIUM CHLORIDE 0.9 % IV SOLN: INTRAVENOUS | Status: DC

## 2012-06-24 MED ORDER — AMOXICILLIN-POT CLAVULANATE 250-62.5 MG/5ML PO SUSR: 875.0000 mg | Freq: Two times a day (BID) | ORAL | Status: DC

## 2012-06-24 NOTE — ED Notes (Signed)
Registration in room. Pt answering questions appropriately.

## 2012-06-24 NOTE — ED Notes (Signed)
Pt refusing to answer triage questions stating "check your computer, I done told you my throat hurts"

## 2012-06-24 NOTE — ED Notes (Signed)
Pt c/o sore throat that is painful to swallow. Pt seen in ED 06/20/12 and treated for strep throat. Pt states no relief.

## 2012-06-24 NOTE — ED Provider Notes (Signed)
History     CSN: [id number]  Arrival date & time 06/24/12  0757   First MD Initiated Contact with Patient 06/24/12 [phone]      Chief Complaint  Patient presents with  . Sore Throat    (Consider location/radiation/quality/duration/timing/severity/associated sxs/prior treatment) HPI Comments: Martha Buck is a 51 y.o. Female who has a sore throat, is unable to swallow, or eat. The symptoms are worsening over the last 4 days. The pain is sharp, and waxes and wanes. She denies fever, chills, nausea, or vomiting. She is not spitting saliva or coughing. She was treated 4 days ago with IM Bicillin for streptococcal pharyngitis. She has not been able to take any of her medication. There are no alleviating factors.  Patient is a 51 y.o. female presenting with pharyngitis. The history is provided by the patient.  Sore Throat    History reviewed. No pertinent past medical history.  Past Surgical History  Procedure Date  . Back surgery     History reviewed. No pertinent family history.  History  Substance Use Topics  . Smoking status: Current Some Day Smoker  . Smokeless tobacco: Not on file  . Alcohol Use: No    OB History    Grav Para Term Preterm Abortions TAB SAB Ect Mult Living                  Review of Systems  All other systems reviewed and are negative.    Allergies  Review of patient's allergies indicates no known allergies.  Home Medications   Current Outpatient Rx  Name  Route  Sig  Dispense  Refill  . DULOXETINE HCL 60 MG PO CPEP   Oral   Take 60 mg by mouth 2 (two) times daily.         Marland Kitchen HYDROCODONE-ACETAMINOPHEN 5-325 MG PO TABS   Oral   Take 1 tablet by mouth every 6 (six) hours as needed. For pain         . METHOCARBAMOL 500 MG PO TABS   Oral   Take 500 mg by mouth 2 (two) times daily.         . AMOXICILLIN-POT CLAVULANATE 250-62.5 MG/5ML PO SUSR   Oral   Take 17.5 mLs (875 mg total) by mouth 2 (two) times daily.   150 mL   0   .  PREDNISOLONE 15 MG/5ML PO SYRP      45mg  taper-45mg  day 1 and 2, 35mg  day 3 and 4, 25mg  day 5 and 6, 15mg  day 7 and 8,5 mg day 9 and 10   100 mL   0     BP 100/65  Pulse 60  Temp 97.6 F (36.4 C) (Oral)  Resp 16  Ht 5\' 7"  (1.702 m)  Wt 230 lb (104.327 kg)  BMI 36.02 kg/m2  SpO2 98%  LMP 04/26/2012  Physical Exam  Nursing note and vitals reviewed. Constitutional: She is oriented to person, place, and time. She appears well-developed and well-nourished.  HENT:  Head: Normocephalic and atraumatic.       Hyperemia right soft palate with localized swelling and exudate. The tonsil on the right is minimally enlarged. No left tonsillar hypertrophy. She has mild trismus.  Eyes: Conjunctivae normal and EOM are normal. Pupils are equal, round, and reactive to light.  Neck: Normal range of motion and phonation normal. Neck supple. No tracheal deviation present.       Right upper neck is mildly tender, with indistinct swelling, there is no  palpable adenopathy.  Cardiovascular: Normal rate, regular rhythm and intact distal pulses.   Pulmonary/Chest: Effort normal and breath sounds normal. She exhibits no tenderness.  Musculoskeletal: Normal range of motion.  Neurological: She is alert and oriented to person, place, and time. She has normal strength. She exhibits normal muscle tone.  Skin: Skin is warm and dry.  Psychiatric: She has a normal mood and affect. Her behavior is normal. Judgment and thought content normal.    ED Course  Procedures (including critical care time)  Initial treatment: IV fluids, bolus, and drip. IV, fentanyl, Zofran, and Rocephin. She will be evaluated for metabolic instability, and progression of streptococcal pharyngitis with a CT of the neck.       Labs Reviewed  CBC WITH DIFFERENTIAL - Abnormal; Notable for the following:    WBC 12.4 (*)     Neutro Abs 8.4 (*)     Monocytes Absolute 1.2 (*)     All other components within normal limits  BASIC  METABOLIC PANEL - Abnormal; Notable for the following:    Potassium 3.2 (*)     All other components within normal limits  LAB REPORT - SCANNED      1. Pharyngeal abscess       MDM  Tonsillitis , CT ordered to r/o abscess.   Disposition per CDU provider     Flint Melter, MD 06/30/12 [phone]

## 2012-06-24 NOTE — ED Provider Notes (Signed)
11:14 AM Patient with a hx sig for sore throat was placed in CDU on observation by Dr. Effie Shy. Patient care resumed from Dr. Effie Shy .  Patient is here for pending results of a CT scan to rule out pharyngeal abscess and has received Rocephin, Fentanyl and Zofran. Patient re-evaluated and is resting comfortable, VSS, with no new complaints or concerns at this time. Plan per previous provider is to wait for CT results that will determine patient's disposition. If positive, I will consult ENT. If negative, I will consult hospitalist. On exam: hemodynamically stable, NAD, heart w/ RRR, lungs CTAB, Chest & abd non-tender, no peripheral edema or calf tenderness. Patient complains of sore throat and neck tenderness at this time.    2:46 PM CT shows developing pharyngeal abscess. Will call ENT for consult.  3:13 PM ENT consult results in discharge the patient with 45mg  liquid predisolone x10days and follow up with ENT outpatient.   Martha Beck, PA-C 06/24/12 1513

## 2012-06-30 NOTE — ED Provider Notes (Signed)
Medical screening examination/treatment/procedure(s) were conducted as a shared visit with non-physician practitioner(s) and myself.  I personally evaluated the patient during the encounter  Flint Melter, MD 06/30/12 0010

## 2012-11-09 ENCOUNTER — Other Ambulatory Visit: Payer: Self-pay | Admitting: Orthopedic Surgery

## 2012-11-19 ENCOUNTER — Encounter (HOSPITAL_BASED_OUTPATIENT_CLINIC_OR_DEPARTMENT_OTHER): Payer: Self-pay | Admitting: *Deleted

## 2012-11-23 ENCOUNTER — Other Ambulatory Visit: Payer: Self-pay | Admitting: Orthopedic Surgery

## 2012-11-23 NOTE — H&P (Signed)
Martha Buck is an 52 y.o. female.   Chief Complaint: right knee pain HPI: c/o right knee pain x 3 years, 2 months. Noted ongoing pain, swelling, giving way and instability. Refractory to activity modification, rest, PT, injections.  Past Medical History  Diagnosis Date  . 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 Acute meniscal tear of knee     RIGHT    Past Surgical History  Procedure Laterality Date  . Lumbar fusion  03-20-2010    L4 -- L5  . Ectopic pregnancy surgery  1991    No family history on file. Social History:  reports that she has been smoking.  She does not have any smokeless tobacco history on file. She reports that she does not drink alcohol or use illicit drugs.  Allergies: No Known Allergies   (Not in a hospital admission)  No results found for this or any previous visit (from the past 48 hour(s)). No results found.  Review of Systems  Constitutional: Positive for weight loss.  HENT: Negative.   Eyes: Negative.   Cardiovascular: Negative.   Gastrointestinal: Negative.   Genitourinary: Negative.   Musculoskeletal: Positive for joint pain.  Skin: Negative.   Neurological: Positive for dizziness.  Endo/Heme/Allergies: Negative.   Psychiatric/Behavioral: Positive for depression.    Last menstrual period 04/26/2012. Physical Exam  Constitutional: She is oriented to person, place, and time. She appears well-developed and well-nourished.  HENT:  Head: Normocephalic and atraumatic.  Eyes: Conjunctivae and EOM are normal. Pupils are equal, round, and reactive to light.  Neck: Normal range of motion. Neck supple.  Cardiovascular: Normal rate and regular rhythm.   Respiratory: Effort normal and breath sounds normal.  GI: Soft.  Musculoskeletal:  On exam, she's tender in the medial joint line. She has a positive McMurray. Patellofemoral pain with compression.  Knee exam on inspection reveals no evidence of soft tissue swelling,  ecchymosis, deformity or erythema. On palpation there is no tenderness in the lateral joint line. Nontender over the fibular head or the peroneal nerve. Nontender over the quadriceps insertion of the patellar ligament insertion. The range of motion is full. Provocative maneuvers reveals a negative Lachman, negative anterior and posterior drawer. No instability is noted with varus and valgus stressing at 0 or 30 degrees. On manual motor test the quadriceps and hamstrings are 5/5. Sensory exam is intact to light touch.  Neurological: She is alert and oriented to person, place, and time. She has normal reflexes.  Skin: Skin is warm and dry.  Psychiatric: She has a normal mood and affect.    MRI 05/20/12 demonstrates a radial tear through the medial meniscus, partial and full thickness chondral loss in the patellofemoral joint and medial compartment, moderate effusion.  Assessment/Plan Symptomatic medial meniscus tear, patellofemoral arthrosis, refractory despite rest, activity modification, therapy and injections and postural modifications.  We discussed options. We will proceed with knee arthroscopy.  I had a long discussion with the patient concerning the risks and benefits of knee arthroscopy including help from the arthroscopic procedure as well as no help from the arthroscopic procedure or worsening of symptoms. Also discussed infection, DVT, PE, anesthetic complications, etc. Also discussed the possibility of repeat arthroscopic surgery required in the future or total knee replacement. I provided the patient with an illustrated handout and discussed it in detail as well as discussed the postoperative and perioperative courses and return to functional activities including work. Need for postoperative DVT prophylaxis was discussed as  well.  Plan right knee arthroscopy, partial medial meniscectomy, debridement  BISSELL, JACLYN M. for Dr. Shelle Iron 11/23/2012, 1:38 PM

## 2012-11-24 ENCOUNTER — Encounter (HOSPITAL_BASED_OUTPATIENT_CLINIC_OR_DEPARTMENT_OTHER): Payer: Self-pay | Admitting: *Deleted

## 2012-11-24 NOTE — Progress Notes (Signed)
NPO AFTER MN WITH EXCEPTION CLEAR LIQUIDS UNTIL 0830 (NO CREAM/ MILK PRODUCTS). ARRIVES AT 1245. NEEDS HG. MAY TAKE HYDROCODONE IF NEEDED W/ SIPS OF WATER.

## 2012-11-26 ENCOUNTER — Ambulatory Visit (HOSPITAL_BASED_OUTPATIENT_CLINIC_OR_DEPARTMENT_OTHER): Payer: Medicare Other | Admitting: Anesthesiology

## 2012-11-26 ENCOUNTER — Encounter (HOSPITAL_BASED_OUTPATIENT_CLINIC_OR_DEPARTMENT_OTHER): Payer: Self-pay | Admitting: Anesthesiology

## 2012-11-26 ENCOUNTER — Ambulatory Visit (HOSPITAL_BASED_OUTPATIENT_CLINIC_OR_DEPARTMENT_OTHER)
Admission: RE | Admit: 2012-11-26 | Discharge: 2012-11-26 | Disposition: A | Discharge status: Home / Self Care | Payer: Medicare Other | Source: Ambulatory Visit | Attending: Specialist | Admitting: Specialist

## 2012-11-26 ENCOUNTER — Encounter (HOSPITAL_BASED_OUTPATIENT_CLINIC_OR_DEPARTMENT_OTHER)
Admission: RE | Disposition: A | Discharge status: Home / Self Care | Payer: Self-pay | Source: Ambulatory Visit | Attending: Specialist

## 2012-11-26 ENCOUNTER — Encounter (HOSPITAL_BASED_OUTPATIENT_CLINIC_OR_DEPARTMENT_OTHER): Payer: Self-pay

## 2012-11-26 DIAGNOSIS — M2390 Unspecified internal derangement of unspecified knee: Secondary | ICD-10-CM | POA: Insufficient documentation

## 2012-11-26 DIAGNOSIS — F329 Major depressive disorder, single episode, unspecified: Secondary | ICD-10-CM | POA: Insufficient documentation

## 2012-11-26 DIAGNOSIS — F3289 Other specified depressive episodes: Secondary | ICD-10-CM | POA: Insufficient documentation

## 2012-11-26 DIAGNOSIS — S83206A Unspecified tear of unspecified meniscus, current injury, right knee, initial encounter: Secondary | ICD-10-CM

## 2012-11-26 DIAGNOSIS — F17200 Nicotine dependence, unspecified, uncomplicated: Secondary | ICD-10-CM | POA: Insufficient documentation

## 2012-11-26 DIAGNOSIS — M1710 Unilateral primary osteoarthritis, unspecified knee: Secondary | ICD-10-CM | POA: Insufficient documentation

## 2012-11-26 DIAGNOSIS — E669 Obesity, unspecified: Secondary | ICD-10-CM | POA: Insufficient documentation

## 2012-11-26 DIAGNOSIS — M23329 Other meniscus derangements, posterior horn of medial meniscus, unspecified knee: Secondary | ICD-10-CM | POA: Insufficient documentation

## 2012-11-26 HISTORY — PX: KNEE ARTHROSCOPY WITH MEDIAL MENISECTOMY: SHX5651

## 2012-11-26 HISTORY — DX: Unilateral primary osteoarthritis, unspecified knee: M17.10

## 2012-11-26 HISTORY — DX: Major depressive disorder, single episode, unspecified: F32.9

## 2012-11-26 HISTORY — DX: Depression, unspecified: F32.A

## 2012-11-26 HISTORY — DX: Osteoarthritis of knee, unspecified: M17.9

## 2012-11-26 SURGERY — ARTHROSCOPY, KNEE, WITH MEDIAL MENISCECTOMY
Anesthesia: General | Site: Knee | Laterality: Right | Wound class: Clean

## 2012-11-26 MED ORDER — CEFAZOLIN SODIUM-DEXTROSE 2-3 GM-% IV SOLR
2.0000 g | INTRAVENOUS | Status: AC
  Administered 2012-11-26: 2 g via INTRAVENOUS
  Filled 2012-11-26: qty 50

## 2012-11-26 MED ORDER — SODIUM CHLORIDE 0.9 % IR SOLN
Status: DC | PRN
  Administered 2012-11-26: 16:00:00

## 2012-11-26 MED ORDER — HYDROCODONE-ACETAMINOPHEN 7.5-325 MG PO TABS: 1.0000 | ORAL_TABLET | ORAL | Status: DC | PRN

## 2012-11-26 MED ORDER — HYDROCODONE-ACETAMINOPHEN 7.5-325 MG PO TABS
1.0000 | ORAL_TABLET | Freq: Four times a day (QID) | ORAL | Status: DC | PRN
  Administered 2012-11-26: 1 via ORAL
  Filled 2012-11-26: qty 1

## 2012-11-26 MED ORDER — LACTATED RINGERS IV SOLN
INTRAVENOUS | Status: DC
  Administered 2012-11-26: 13:00:00 via INTRAVENOUS
  Filled 2012-11-26: qty 1000

## 2012-11-26 MED ORDER — PROPOFOL 10 MG/ML IV BOLUS
INTRAVENOUS | Status: DC | PRN
  Administered 2012-11-26: 200 mg via INTRAVENOUS

## 2012-11-26 MED ORDER — LACTATED RINGERS IV SOLN
INTRAVENOUS | Status: DC
  Filled 2012-11-26: qty 1000

## 2012-11-26 MED ORDER — CHLORHEXIDINE GLUCONATE 4 % EX LIQD
60.0000 mL | Freq: Once | CUTANEOUS | Status: DC
  Filled 2012-11-26: qty 60

## 2012-11-26 MED ORDER — DEXAMETHASONE SODIUM PHOSPHATE 4 MG/ML IJ SOLN
INTRAMUSCULAR | Status: DC | PRN
  Administered 2012-11-26: 8 mg via INTRAVENOUS

## 2012-11-26 MED ORDER — FENTANYL CITRATE 0.05 MG/ML IJ SOLN
25.0000 ug | INTRAMUSCULAR | Status: DC | PRN
  Filled 2012-11-26: qty 1

## 2012-11-26 MED ORDER — MIDAZOLAM HCL 5 MG/5ML IJ SOLN
INTRAMUSCULAR | Status: DC | PRN
  Administered 2012-11-26: 2 mg via INTRAVENOUS

## 2012-11-26 MED ORDER — PROMETHAZINE HCL 25 MG/ML IJ SOLN
6.2500 mg | INTRAMUSCULAR | Status: DC | PRN
  Filled 2012-11-26: qty 1

## 2012-11-26 MED ORDER — FENTANYL CITRATE 0.05 MG/ML IJ SOLN
INTRAMUSCULAR | Status: DC | PRN
  Administered 2012-11-26 (×2): 50 ug via INTRAVENOUS

## 2012-11-26 MED ORDER — LIDOCAINE HCL (CARDIAC) 20 MG/ML IV SOLN
INTRAVENOUS | Status: DC | PRN
  Administered 2012-11-26: 50 mg via INTRAVENOUS

## 2012-11-26 MED ORDER — BUPIVACAINE-EPINEPHRINE 0.5% -1:200000 IJ SOLN
INTRAMUSCULAR | Status: DC | PRN
  Administered 2012-11-26: 25 mL

## 2012-11-26 MED ORDER — ONDANSETRON HCL 4 MG/2ML IJ SOLN
INTRAMUSCULAR | Status: DC | PRN
  Administered 2012-11-26: 4 mg via INTRAVENOUS

## 2012-11-26 SURGICAL SUPPLY — 47 items
BANDAGE ELASTIC 6 VELCRO ST LF (GAUZE/BANDAGES/DRESSINGS) ×2 IMPLANT
BLADE 4.2CUDA (BLADE) IMPLANT
BLADE CUDA SHAVER 3.5 (BLADE) ×2 IMPLANT
BLADE GREAT WHITE 4.2 (BLADE) IMPLANT
BOOTIES KNEE HIGH SLOAN (MISCELLANEOUS) ×2 IMPLANT
CANISTER SUCT LVC 12 LTR MEDI- (MISCELLANEOUS) ×2 IMPLANT
CANISTER SUCTION 1200CC (MISCELLANEOUS) IMPLANT
CANISTER SUCTION 2500CC (MISCELLANEOUS) IMPLANT
CANNULA ACUFLEX KIT 5X76 (CANNULA) IMPLANT
CLOTH BEACON ORANGE TIMEOUT ST (SAFETY) ×2 IMPLANT
CUTTER MENISCUS  4.2MM (BLADE)
CUTTER MENISCUS 4.2MM (BLADE) IMPLANT
DRAPE ARTHROSCOPY W/POUCH 114 (DRAPES) ×2 IMPLANT
DRSG EMULSION OIL 3X3 NADH (GAUZE/BANDAGES/DRESSINGS) ×2 IMPLANT
DRSG PAD ABDOMINAL 8X10 ST (GAUZE/BANDAGES/DRESSINGS) ×2 IMPLANT
DURAPREP 26ML APPLICATOR (WOUND CARE) ×2 IMPLANT
ELECT REM PT RETURN 9FT ADLT (ELECTROSURGICAL)
ELECTRODE REM PT RTRN 9FT ADLT (ELECTROSURGICAL) IMPLANT
GLOVE BIO SURGEON STRL SZ7 (GLOVE) ×2 IMPLANT
GLOVE BIOGEL PI IND STRL 7.0 (GLOVE) ×2 IMPLANT
GLOVE BIOGEL PI IND STRL 7.5 (GLOVE) ×1 IMPLANT
GLOVE BIOGEL PI INDICATOR 7.0 (GLOVE) ×2
GLOVE BIOGEL PI INDICATOR 7.5 (GLOVE) ×1
GLOVE ECLIPSE 7.0 STRL STRAW (GLOVE) ×2 IMPLANT
GLOVE SURG SS PI 8.0 STRL IVOR (GLOVE) ×2 IMPLANT
GOWN PREVENTION PLUS LG XLONG (DISPOSABLE) ×4 IMPLANT
GOWN STRL REIN XL XLG (GOWN DISPOSABLE) ×2 IMPLANT
IV NS IRRIG 3000ML ARTHROMATIC (IV SOLUTION) ×4 IMPLANT
KNEE WRAP E Z 3 GEL PACK (MISCELLANEOUS) ×2 IMPLANT
MINI VAC (SURGICAL WAND) IMPLANT
NDL SAFETY ECLIPSE 18X1.5 (NEEDLE) ×2 IMPLANT
NEEDLE FILTER BLUNT 18X 1/2SAF (NEEDLE) ×1
NEEDLE FILTER BLUNT 18X1 1/2 (NEEDLE) ×1 IMPLANT
NEEDLE HYPO 18GX1.5 SHARP (NEEDLE) ×2
PACK ARTHROSCOPY DSU (CUSTOM PROCEDURE TRAY) ×2 IMPLANT
PACK BASIN DAY SURGERY FS (CUSTOM PROCEDURE TRAY) ×2 IMPLANT
PADDING CAST COTTON 6X4 STRL (CAST SUPPLIES) ×2 IMPLANT
RESECTOR FULL RADIUS 4.2MM (BLADE) IMPLANT
SET ARTHROSCOPY TUBING (MISCELLANEOUS) ×1
SET ARTHROSCOPY TUBING LN (MISCELLANEOUS) ×1 IMPLANT
SPONGE GAUZE 4X4 12PLY (GAUZE/BANDAGES/DRESSINGS) ×2 IMPLANT
SUT ETHILON 4 0 PS 2 18 (SUTURE) ×2 IMPLANT
SYR 30ML LL (SYRINGE) ×2 IMPLANT
SYRINGE 10CC LL (SYRINGE) ×2 IMPLANT
TOWEL OR 17X24 6PK STRL BLUE (TOWEL DISPOSABLE) ×2 IMPLANT
WAND 90 DEG TURBOVAC W/CORD (SURGICAL WAND) IMPLANT
WATER STERILE IRR 500ML POUR (IV SOLUTION) ×2 IMPLANT

## 2012-11-26 NOTE — H&P (View-Only) (Signed)
Martha Buck is an 51 y.o. female.   Chief Complaint: right knee pain HPI: c/o right knee pain x 3 years, 2 months. Noted ongoing pain, swelling, giving way and instability. Refractory to activity modification, rest, PT, injections.  Past Medical History  Diagnosis Date  . 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 Acute meniscal tear of knee     RIGHT    Past Surgical History  Procedure Laterality Date  . Lumbar fusion  03-20-2010    L4 -- L5  . Ectopic pregnancy surgery  1991    No family history on file. Social History:  reports that she has been smoking.  She does not have any smokeless tobacco history on file. She reports that she does not drink alcohol or use illicit drugs.  Allergies: No Known Allergies   (Not in a hospital admission)  No results found for this or any previous visit (from the past 48 hour(s)). No results found.  Review of Systems  Constitutional: Positive for weight loss.  HENT: Negative.   Eyes: Negative.   Cardiovascular: Negative.   Gastrointestinal: Negative.   Genitourinary: Negative.   Musculoskeletal: Positive for joint pain.  Skin: Negative.   Neurological: Positive for dizziness.  Endo/Heme/Allergies: Negative.   Psychiatric/Behavioral: Positive for depression.    Last menstrual period 04/26/2012. Physical Exam  Constitutional: She is oriented to person, place, and time. She appears well-developed and well-nourished.  HENT:  Head: Normocephalic and atraumatic.  Eyes: Conjunctivae and EOM are normal. Pupils are equal, round, and reactive to light.  Neck: Normal range of motion. Neck supple.  Cardiovascular: Normal rate and regular rhythm.   Respiratory: Effort normal and breath sounds normal.  GI: Soft.  Musculoskeletal:  On exam, she's tender in the medial joint line. She has a positive McMurray. Patellofemoral pain with compression.  Knee exam on inspection reveals no evidence of soft tissue swelling,  ecchymosis, deformity or erythema. On palpation there is no tenderness in the lateral joint line. Nontender over the fibular head or the peroneal nerve. Nontender over the quadriceps insertion of the patellar ligament insertion. The range of motion is full. Provocative maneuvers reveals a negative Lachman, negative anterior and posterior drawer. No instability is noted with varus and valgus stressing at 0 or 30 degrees. On manual motor test the quadriceps and hamstrings are 5/5. Sensory exam is intact to light touch.  Neurological: She is alert and oriented to person, place, and time. She has normal reflexes.  Skin: Skin is warm and dry.  Psychiatric: She has a normal mood and affect.    MRI 05/20/12 demonstrates a radial tear through the medial meniscus, partial and full thickness chondral loss in the patellofemoral joint and medial compartment, moderate effusion.  Assessment/Plan Symptomatic medial meniscus tear, patellofemoral arthrosis, refractory despite rest, activity modification, therapy and injections and postural modifications.  We discussed options. We will proceed with knee arthroscopy.  I had a long discussion with the patient concerning the risks and benefits of knee arthroscopy including help from the arthroscopic procedure as well as no help from the arthroscopic procedure or worsening of symptoms. Also discussed infection, DVT, PE, anesthetic complications, etc. Also discussed the possibility of repeat arthroscopic surgery required in the future or total knee replacement. I provided the patient with an illustrated handout and discussed it in detail as well as discussed the postoperative and perioperative courses and return to functional activities including work. Need for postoperative DVT prophylaxis was discussed as   well.  Plan right knee arthroscopy, partial medial meniscectomy, debridement  BISSELL, JACLYN M. for Dr. Beane 11/23/2012, 1:38 PM    

## 2012-11-26 NOTE — Brief Op Note (Signed)
11/26/2012  3:50 PM  PATIENT:  Franki Cabot Wander  52 y.o. female  PRE-OPERATIVE DIAGNOSIS:  DJD/MENISCUS TEAR RIGHT KNEE  POST-OPERATIVE DIAGNOSIS:  DJD/MENISCUS TEAR RIGHT KNEE  PROCEDURE:  Procedure(s): KNEE ARTHROSCOPY WITH PARTIAL MEDIAL MENISECTOMY,DEBRIDEMENT (Right)  SURGEON:  Surgeon(s) and Role:    * Javier Docker, MD - Primary  PHYSICIAN ASSISTANT:   ASSISTANTS: none   ANESTHESIA:   general  EBL:  Total I/O In: 200 [I.V.:200] Out: -   BLOOD ADMINISTERED:none  DRAINS: none   LOCAL MEDICATIONS USED:  MARCAINE     SPECIMEN:  No Specimen  DISPOSITION OF SPECIMEN:  N/A  COUNTS:  YES  TOURNIQUET:  * No tourniquets in log *  DICTATION: .Other Dictation: Dictation Number [id number]  PLAN OF CARE: Discharge to home after PACU  PATIENT DISPOSITION:  PACU - hemodynamically stable.   Delay start of Pharmacological VTE agent (>24hrs) due to surgical blood loss or risk of bleeding: not applicable

## 2012-11-26 NOTE — Interval H&P Note (Signed)
History and Physical Interval Note:  11/26/2012 12:56 PM  Martha Buck  has presented today for surgery, with the diagnosis of DJD/MENISCUS TEAR RIGHT KNEE  The various methods of treatment have been discussed with the patient and family. After consideration of risks, benefits and other options for treatment, the patient has consented to  Procedure(s): KNEE ARTHROSCOPY WITH PARTIAL MEDIAL MENISECTOMY (Right) as a surgical intervention .  The patient's history has been reviewed, patient examined, no change in status, stable for surgery.  I have reviewed the patient's chart and labs.  Questions were answered to the patient's satisfaction.     Rand Etchison C

## 2012-11-26 NOTE — Anesthesia Procedure Notes (Signed)
Procedure Name: LMA Insertion Date/Time: 11/26/2012 3:23 PM Performed by: Renella Cunas D Pre-anesthesia Checklist: Patient identified, Emergency Drugs available, Suction available and Patient being monitored Patient Re-evaluated:Patient Re-evaluated prior to inductionOxygen Delivery Method: Circle System Utilized Preoxygenation: Pre-oxygenation with 100% oxygen Intubation Type: IV induction Ventilation: Mask ventilation without difficulty LMA: LMA inserted LMA Size: 4.0 Number of attempts: 1 Airway Equipment and Method: bite block Placement Confirmation: positive ETCO2 Tube secured with: Tape Dental Injury: Teeth and Oropharynx as per pre-operative assessment

## 2012-11-26 NOTE — Transfer of Care (Signed)
Immediate Anesthesia Transfer of Care Note  Patient: Martha Buck  Procedure(s) Performed: Procedure(s) (LRB): KNEE ARTHROSCOPY WITH PARTIAL MEDIAL MENISECTOMY,DEBRIDEMENT (Right)  Patient Location: PACU  Anesthesia Type: General  Level of Consciousness: awake, oriented, sedated and patient cooperative  Airway & Oxygen Therapy: Patient Spontanous Breathing and Patient connected to face mask oxygen  Post-op Assessment: Report given to PACU RN and Post -op Vital signs reviewed and stable  Post vital signs: Reviewed and stable  Complications: No apparent anesthesia complications

## 2012-11-26 NOTE — Anesthesia Preprocedure Evaluation (Signed)
Anesthesia Evaluation  Patient identified by MRN, date of birth, ID band Patient awake    Reviewed: Allergy & Precautions, H&P , NPO status , Patient's Chart, lab work & pertinent test results  Airway Mallampati: II TM Distance: >3 FB Neck ROM: Full    Dental no notable dental hx.    Pulmonary Current Smoker,  breath sounds clear to auscultation  Pulmonary exam normal       Cardiovascular negative cardio ROS  Rhythm:Regular Rate:Normal     Neuro/Psych PSYCHIATRIC DISORDERS Depression negative neurological ROS     GI/Hepatic negative GI ROS, Neg liver ROS,   Endo/Other  negative endocrine ROS  Renal/GU negative Renal ROS  negative genitourinary   Musculoskeletal negative musculoskeletal ROS (+)   Abdominal (+) + obese,   Peds negative pediatric ROS (+)  Hematology negative hematology ROS (+)   Anesthesia Other Findings   Reproductive/Obstetrics negative OB ROS                           Anesthesia Physical Anesthesia Plan  ASA: II  Anesthesia Plan: General   Post-op Pain Management:    Induction: Intravenous  Airway Management Planned: LMA  Additional Equipment:   Intra-op Plan:   Post-operative Plan: Extubation in OR  Informed Consent: I have reviewed the patients History and Physical, chart, labs and discussed the procedure including the risks, benefits and alternatives for the proposed anesthesia with the patient or authorized representative who has indicated his/her understanding and acceptance.   Dental advisory given  Plan Discussed with: CRNA  Anesthesia Plan Comments:         Anesthesia Quick Evaluation

## 2012-11-29 ENCOUNTER — Encounter (HOSPITAL_BASED_OUTPATIENT_CLINIC_OR_DEPARTMENT_OTHER): Payer: Self-pay | Admitting: Specialist

## 2012-11-29 LAB — POCT HEMOGLOBIN-HEMACUE: Hemoglobin: 13.7 g/dL (ref 12.0–15.0)

## 2012-11-29 NOTE — Op Note (Signed)
NAMECODA, FILLER              ACCOUNT NO.:  [id number]  MEDICAL RECORD NO.:  [id number]  LOCATION:                                FACILITY:  [facility]  PHYSICIAN:  Jene Every, M.D.    DATE OF BIRTH:  [birth year]  DATE OF PROCEDURE:  11/26/2012 DATE OF DISCHARGE:                              OPERATIVE REPORT   PREOPERATIVE DIAGNOSIS:  Degenerative joint disease with meniscus tear, right knee.  POSTOPERATIVE DIAGNOSES:  Degenerative joint disease with meniscus tear, right knee, extensive grade 3 lesion medial femoral condyle, patella, femoral sulcus.  PROCEDURES PERFORMED: 1. Right knee arthroscopy. 2. Partial medial meniscectomy. 3. Chondroplasty patella, femoral sulcus, medial femoral condyle.  ANESTHESIA:  General.  ASSISTANT:  No assistant.  HISTORY:  A 52 year old, knee pain, locking, popping, giving way with meniscus tear noted refractory conservative treatment, DJD, indicated for arthroscopy partial meniscectomy.  Risks and benefits discussed including bleeding, infection, DVT, PE, anesthetic complications, etc.  TECHNIQUE:  With the patient in supine position, after induction of adequate general anesthesia, 2 g Kefzol, the right lower extremity was prepped and draped in usual sterile fashion.  A lateral parapatellar portal was fashioned with a #11 blade.  Ingress cannula atraumatically placed.  Irrigant was utilized to insufflate the joint.  Under direct visualization, medial parapatellar portal was fashioned with a #11 blade after localization with an 18-gauge needle sparing the medial meniscus. There was extensive grade 3 changes of femoral condyle chondral flap tears as well as posterior 3rd meniscus tear.  Basket rongeur introduced, utilized to perform partial medial meniscectomy to a stable base and proximal one-third of the posterior third was excised.  The remnant was stable to probe palpation.  Chondral flap tears were noted of the femoral condyle, a 2 x 2  cm lesion light chondroplasty performed here.  Almost grade 4 lesion.  Next, ACL was examined unremarkable. Lateral compartment revealed normal femoral condyle, tibial plateau, and meniscus stable to probe palpation.  Suprapatellar pouch revealed extensive grade 3 changes of the patella and the sulcus.  Light chondroplasty performed here.  Normal patellofemoral tracking.  Gutters were unremarkable.  Revisit all compartments.  No further pathology amenable to arthroscopic intervention.  Therefore, removed all instrumentation.  Portals were closed with running 4-0 nylon simple sutures.  0.25% Marcaine with epinephrine was infiltrated.  Joint wound was dressed sterilely.  Awoken without difficulty and transported to the recovery room in satisfactory condition.  The patient tolerated the procedure well.  No complications.  No assistant.     Jene Every, M.D.     Cordelia Pen  D:  11/26/2012  T:  11/27/2012  Job:  [id number]

## 2013-04-20 ENCOUNTER — Emergency Department (HOSPITAL_COMMUNITY)
Admission: EM | Admit: 2013-04-20 | Discharge: 2013-04-20 | Disposition: A | Discharge status: Home / Self Care | Payer: Medicare Other | Attending: Emergency Medicine | Admitting: Emergency Medicine

## 2013-04-20 ENCOUNTER — Encounter (HOSPITAL_COMMUNITY): Payer: Self-pay | Admitting: Emergency Medicine

## 2013-04-20 DIAGNOSIS — F17200 Nicotine dependence, unspecified, uncomplicated: Secondary | ICD-10-CM | POA: Insufficient documentation

## 2013-04-20 DIAGNOSIS — Z87828 Personal history of other (healed) physical injury and trauma: Secondary | ICD-10-CM | POA: Insufficient documentation

## 2013-04-20 DIAGNOSIS — Z8739 Personal history of other diseases of the musculoskeletal system and connective tissue: Secondary | ICD-10-CM | POA: Insufficient documentation

## 2013-04-20 DIAGNOSIS — Z8659 Personal history of other mental and behavioral disorders: Secondary | ICD-10-CM | POA: Insufficient documentation

## 2013-04-20 DIAGNOSIS — K0889 Other specified disorders of teeth and supporting structures: Secondary | ICD-10-CM

## 2013-04-20 DIAGNOSIS — K089 Disorder of teeth and supporting structures, unspecified: Secondary | ICD-10-CM | POA: Insufficient documentation

## 2013-04-20 MED ORDER — PENICILLIN V POTASSIUM 500 MG PO TABS: 500.0000 mg | ORAL_TABLET | Freq: Four times a day (QID) | ORAL | Status: DC

## 2013-04-20 MED ORDER — OXYCODONE-ACETAMINOPHEN 5-325 MG PO TABS: 2.0000 | ORAL_TABLET | Freq: Four times a day (QID) | ORAL | Status: DC | PRN

## 2013-04-20 MED ORDER — ONDANSETRON HCL 4 MG PO TABS: 4.0000 mg | ORAL_TABLET | Freq: Four times a day (QID) | ORAL | Status: DC

## 2013-04-20 NOTE — ED Notes (Signed)
Onset this am, left upper and lower dental pain.

## 2013-04-20 NOTE — ED Provider Notes (Signed)
CSN: [id number]     Arrival date & time 04/20/13  1133 History   First MD Initiated Contact with Patient 04/20/13 1134     Chief Complaint  Patient presents with  . Dental Pain   (Consider location/radiation/quality/duration/timing/severity/associated sxs/prior Treatment) HPI Comments: Patient's 52 year old female who presents today with left-sided dental pain since this morning. She reports she was helping "Her baby" and will get ready for school when the pain began. She reports the pain feels like "having a baby" she has never had pain like this in the past. Radiates into her left ear. Ice has not made her pain better. She denies trismus, drooling, shortness of breath, Fever, chills, nausea, vomiting, abdominal pain.  The history is provided by the patient. No language interpreter was used.    Past Medical History  Diagnosis Date  . 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 Acute meniscal tear of knee     RIGHT  . Depression    Past Surgical History  Procedure Laterality Date  . Lumbar fusion  03-20-2010    L4 -- L5  . Ectopic pregnancy surgery  1991  . Knee arthroscopy with medial menisectomy Right 11/26/2012    Procedure: KNEE ARTHROSCOPY WITH PARTIAL MEDIAL MENISECTOMY,DEBRIDEMENT;  Surgeon: Javier Docker, MD;  Location: [facility];  Service: Orthopedics;  Laterality: Right;   No family history on file. History  Substance Use Topics  . Smoking status: Current Every Day Smoker -- 34 years    Types: Cigarettes  . Smokeless tobacco: Never Used     Comment: smokes 3 cig daily  . Alcohol Use: Yes     Comment: occasonal   OB History   Grav Para Term Preterm Abortions TAB SAB Ect Mult Living                 Review of Systems  Constitutional: Negative for fever and chills.  HENT: Positive for dental problem. Negative for drooling.   Respiratory: Negative for shortness of breath.   Gastrointestinal: Negative for nausea, vomiting and abdominal  pain.  All other systems reviewed and are negative.    Allergies  Morphine and related  Home Medications   Current Outpatient Rx  Name  Route  Sig  Dispense  Refill  . benzocaine (ORAJEL) 10 % mucosal gel   Mouth/Throat   Use as directed 1 application in the mouth or throat 2 (two) times daily as needed for pain.         Marland Kitchen ibuprofen (ADVIL,MOTRIN) 200 MG tablet   Oral   Take 400 mg by mouth every 6 (six) hours as needed for pain.          BP 118/80  Pulse 77  Temp(Src) 98.4 F (36.9 C) (Oral)  SpO2 98%  LMP 04/26/2012 Physical Exam  Nursing note and vitals reviewed. Constitutional: She is oriented to person, place, and time. She appears well-developed and well-nourished. She does not appear ill. No distress.  HENT:  Head: Normocephalic and atraumatic.  Right Ear: External ear normal.  Left Ear: External ear normal.  Nose: Nose normal.  Mouth/Throat: Uvula is midline, oropharynx is clear and moist and mucous membranes are normal.  No trismus, submental edema, or tongue elevation. Gums on both top and bottom of left side tender to palpation. No drainable abscess.   Eyes: Conjunctivae are normal.  Neck: Normal range of motion.  Cardiovascular: Normal rate, regular rhythm and normal heart sounds.   Pulmonary/Chest: Effort  normal and breath sounds normal. No stridor. No respiratory distress. She has no wheezes. She has no rales.  Abdominal: Soft. She exhibits no distension.  Musculoskeletal: Normal range of motion.  Neurological: She is alert and oriented to person, place, and time. She has normal strength.  Skin: Skin is warm and dry. She is not diaphoretic. No erythema.  Psychiatric: She has a normal mood and affect. Her behavior is normal.    ED Course  Dental Date/Time: 04/20/2013 12:53 PM Performed by: Mora Bellman Authorized by: Mora Bellman Consent: Verbal consent obtained. written consent not obtained. The procedure was performed in an emergent  situation. Risks and benefits: risks, benefits and alternatives were discussed Consent given by: patient Patient understanding: patient states understanding of the procedure being performed Patient consent: the patient's understanding of the procedure matches consent given Required items: required blood products, implants, devices, and special equipment available Patient identity confirmed: verbally with patient and arm band Time out: Immediately prior to procedure a "time out" was called to verify the correct patient, procedure, equipment, support staff and site/side marked as required. Preparation: Patient was prepped and draped in the usual sterile fashion. Local anesthesia used: yes Local anesthetic: bupivacaine 0.25% with epinephrine Anesthetic total: 3.6 ml Patient sedated: no Patient tolerance: Patient tolerated the procedure well with no immediate complications.   (including critical care time) Labs Review Labs Reviewed - No data to display Imaging Review No results found.  MDM   1. Pain, dental    Patient with toothache.  No gross abscess.  Exam unconcerning for Ludwig's angina or spread of infection.  Will treat with penicillin and pain medicine.  Urged patient to follow-up with dentist.       Mora Bellman, PA-C 04/20/13 1507

## 2013-04-20 NOTE — ED Provider Notes (Signed)
Medical screening examination/treatment/procedure(s) were performed by non-physician practitioner and as supervising physician I was immediately available for consultation/collaboration.   Yaneliz Radebaugh M Carrol Bondar, DO 04/20/13 1900 

## 2013-06-28 ENCOUNTER — Emergency Department (HOSPITAL_COMMUNITY)
Admission: EM | Admit: 2013-06-28 | Discharge: 2013-06-28 | Disposition: A | Discharge status: Home / Self Care | Payer: PRIVATE HEALTH INSURANCE | Attending: Emergency Medicine | Admitting: Emergency Medicine

## 2013-06-28 ENCOUNTER — Encounter (HOSPITAL_COMMUNITY): Payer: Self-pay | Admitting: Emergency Medicine

## 2013-06-28 ENCOUNTER — Emergency Department (HOSPITAL_COMMUNITY): Payer: PRIVATE HEALTH INSURANCE

## 2013-06-28 DIAGNOSIS — Z7982 Long term (current) use of aspirin: Secondary | ICD-10-CM | POA: Insufficient documentation

## 2013-06-28 DIAGNOSIS — M62838 Other muscle spasm: Secondary | ICD-10-CM

## 2013-06-28 DIAGNOSIS — Z87891 Personal history of nicotine dependence: Secondary | ICD-10-CM | POA: Diagnosis not present

## 2013-06-28 DIAGNOSIS — S0993XA Unspecified injury of face, initial encounter: Secondary | ICD-10-CM | POA: Diagnosis not present

## 2013-06-28 DIAGNOSIS — M25561 Pain in right knee: Secondary | ICD-10-CM

## 2013-06-28 DIAGNOSIS — M1710 Unilateral primary osteoarthritis, unspecified knee: Secondary | ICD-10-CM | POA: Diagnosis not present

## 2013-06-28 DIAGNOSIS — M549 Dorsalgia, unspecified: Secondary | ICD-10-CM

## 2013-06-28 DIAGNOSIS — IMO0002 Reserved for concepts with insufficient information to code with codable children: Secondary | ICD-10-CM | POA: Insufficient documentation

## 2013-06-28 DIAGNOSIS — Z9889 Other specified postprocedural states: Secondary | ICD-10-CM | POA: Diagnosis not present

## 2013-06-28 DIAGNOSIS — M542 Cervicalgia: Secondary | ICD-10-CM

## 2013-06-28 DIAGNOSIS — Y92410 Unspecified street and highway as the place of occurrence of the external cause: Secondary | ICD-10-CM | POA: Insufficient documentation

## 2013-06-28 DIAGNOSIS — F329 Major depressive disorder, single episode, unspecified: Secondary | ICD-10-CM | POA: Diagnosis not present

## 2013-06-28 DIAGNOSIS — Y9389 Activity, other specified: Secondary | ICD-10-CM | POA: Insufficient documentation

## 2013-06-28 DIAGNOSIS — F3289 Other specified depressive episodes: Secondary | ICD-10-CM | POA: Insufficient documentation

## 2013-06-28 DIAGNOSIS — S8990XA Unspecified injury of unspecified lower leg, initial encounter: Secondary | ICD-10-CM | POA: Diagnosis not present

## 2013-06-28 DIAGNOSIS — G8929 Other chronic pain: Secondary | ICD-10-CM | POA: Insufficient documentation

## 2013-06-28 MED ORDER — TRAMADOL HCL 50 MG PO TABS: 50.0000 mg | ORAL_TABLET | Freq: Four times a day (QID) | ORAL | Status: DC | PRN
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
## 2013-06-28 NOTE — ED Notes (Signed)
Pt is in Peds with her daughter.

## 2013-06-28 NOTE — ED Provider Notes (Signed)
Medical screening examination/treatment/procedure(s) were performed by non-physician practitioner and as supervising physician I was immediately available for consultation/collaboration.  EKG Interpretation   None        Indea Dearman, MD 06/28/13 1636 
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
## 2013-07-13 ENCOUNTER — Ambulatory Visit: Payer: Medicare Other | Admitting: Internal Medicine

## 2013-08-24 ENCOUNTER — Other Ambulatory Visit (HOSPITAL_COMMUNITY): Payer: Self-pay | Admitting: Chiropractic Medicine

## 2013-08-24 DIAGNOSIS — R52 Pain, unspecified: Secondary | ICD-10-CM

## 2013-08-24 DIAGNOSIS — S83206A Unspecified tear of unspecified meniscus, current injury, right knee, initial encounter: Secondary | ICD-10-CM

## 2013-09-02 ENCOUNTER — Ambulatory Visit (HOSPITAL_COMMUNITY)
Admission: RE | Admit: 2013-09-02 | Discharge: 2013-09-02 | Disposition: A | Discharge status: Home / Self Care | Payer: PRIVATE HEALTH INSURANCE | Source: Ambulatory Visit | Attending: Chiropractic Medicine | Admitting: Chiropractic Medicine

## 2013-09-02 DIAGNOSIS — M8560 Other cyst of bone, unspecified site: Secondary | ICD-10-CM | POA: Insufficient documentation

## 2013-09-02 DIAGNOSIS — M2420 Disorder of ligament, unspecified site: Secondary | ICD-10-CM | POA: Insufficient documentation

## 2013-09-02 DIAGNOSIS — M25469 Effusion, unspecified knee: Secondary | ICD-10-CM | POA: Diagnosis not present

## 2013-09-02 DIAGNOSIS — M2240 Chondromalacia patellae, unspecified knee: Secondary | ICD-10-CM | POA: Diagnosis not present

## 2013-09-02 DIAGNOSIS — S83206A Unspecified tear of unspecified meniscus, current injury, right knee, initial encounter: Secondary | ICD-10-CM

## 2013-09-02 DIAGNOSIS — M25569 Pain in unspecified knee: Secondary | ICD-10-CM | POA: Diagnosis not present

## 2013-09-02 DIAGNOSIS — M629 Disorder of muscle, unspecified: Secondary | ICD-10-CM | POA: Diagnosis not present

## 2013-11-27 ENCOUNTER — Emergency Department (HOSPITAL_COMMUNITY): Payer: Medicare Other

## 2013-11-27 ENCOUNTER — Emergency Department (HOSPITAL_COMMUNITY)
Admission: EM | Admit: 2013-11-27 | Discharge: 2013-11-27 | Disposition: A | Discharge status: Home / Self Care | Payer: Medicare Other | Attending: Emergency Medicine | Admitting: Emergency Medicine

## 2013-11-27 DIAGNOSIS — R05 Cough: Secondary | ICD-10-CM | POA: Insufficient documentation

## 2013-11-27 DIAGNOSIS — K209 Esophagitis, unspecified without bleeding: Secondary | ICD-10-CM | POA: Insufficient documentation

## 2013-11-27 DIAGNOSIS — R1013 Epigastric pain: Secondary | ICD-10-CM | POA: Insufficient documentation

## 2013-11-27 DIAGNOSIS — F3289 Other specified depressive episodes: Secondary | ICD-10-CM | POA: Insufficient documentation

## 2013-11-27 DIAGNOSIS — Z87891 Personal history of nicotine dependence: Secondary | ICD-10-CM | POA: Insufficient documentation

## 2013-11-27 DIAGNOSIS — F329 Major depressive disorder, single episode, unspecified: Secondary | ICD-10-CM | POA: Insufficient documentation

## 2013-11-27 DIAGNOSIS — Z79899 Other long term (current) drug therapy: Secondary | ICD-10-CM | POA: Insufficient documentation

## 2013-11-27 DIAGNOSIS — R059 Cough, unspecified: Secondary | ICD-10-CM | POA: Insufficient documentation

## 2013-11-27 DIAGNOSIS — M1710 Unilateral primary osteoarthritis, unspecified knee: Secondary | ICD-10-CM | POA: Insufficient documentation

## 2013-11-27 DIAGNOSIS — IMO0002 Reserved for concepts with insufficient information to code with codable children: Secondary | ICD-10-CM | POA: Insufficient documentation

## 2013-11-27 LAB — CBC
HCT: 39.3 % (ref 36.0–46.0)
HEMOGLOBIN: 13.4 g/dL (ref 12.0–15.0)
MCH: 32.2 pg (ref 26.0–34.0)
MCHC: 34.1 g/dL (ref 30.0–36.0)
MCV: 94.5 fL (ref 78.0–100.0)
Platelets: 229 10*3/uL (ref 150–400)
RBC: 4.16 MIL/uL (ref 3.87–5.11)
RDW: 13.1 % (ref 11.5–15.5)
WBC: 7.8 10*3/uL (ref 4.0–10.5)

## 2013-11-27 LAB — I-STAT TROPONIN, ED: TROPONIN I, POC: 0.02 ng/mL (ref 0.00–0.08)

## 2013-11-27 LAB — HEPATIC FUNCTION PANEL
ALK PHOS: 73 U/L (ref 39–117)
ALT: 12 U/L (ref 0–35)
AST: 13 U/L (ref 0–37)
Albumin: 3.6 g/dL (ref 3.5–5.2)
BILIRUBIN TOTAL: 0.5 mg/dL (ref 0.3–1.2)
Total Protein: 7.2 g/dL (ref 6.0–8.3)

## 2013-11-27 LAB — BASIC METABOLIC PANEL
BUN: 13 mg/dL (ref 6–23)
CALCIUM: 8.7 mg/dL (ref 8.4–10.5)
CO2: 19 mEq/L (ref 19–32)
CREATININE: 0.74 mg/dL (ref 0.50–1.10)
Chloride: 103 mEq/L (ref 96–112)
GFR calc Af Amer: >90 mL/min (ref >90)
GLUCOSE: 86 mg/dL (ref 70–99)
Potassium: 3.7 mEq/L (ref 3.7–5.3)
SODIUM: 141 meq/L (ref 137–147)

## 2013-11-27 LAB — LIPASE, BLOOD: LIPASE: 18 U/L (ref 11–59)

## 2013-11-27 LAB — PRO B NATRIURETIC PEPTIDE: Pro B Natriuretic peptide (BNP): 217.1 pg/mL — ABNORMAL HIGH (ref 0–125)

## 2013-11-27 MED ORDER — BENZONATATE 100 MG PO CAPS: 100.0000 mg | ORAL_CAPSULE | Freq: Three times a day (TID) | ORAL | Status: DC

## 2013-11-27 MED ORDER — OMEPRAZOLE 20 MG PO CPDR: 20.0000 mg | DELAYED_RELEASE_CAPSULE | Freq: Every day | ORAL | Status: DC

## 2013-11-27 MED ORDER — GI COCKTAIL ~~LOC~~
30.0000 mL | Freq: Once | ORAL | Status: AC
  Administered 2013-11-27: 30 mL via ORAL
  Filled 2013-11-27: qty 30

## 2013-11-27 NOTE — ED Notes (Signed)
Patient transported xray

## 2013-11-27 NOTE — ED Notes (Signed)
EDP at bedside  
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
## 2013-11-27 NOTE — ED Notes (Addendum)
She c/o pain in her chest radiating up to her throat and sob x 2 days. She states the pain hurts worse when she breathes. Took an Catering manageralka seltzer without relief

## 2013-11-27 NOTE — Discharge Instructions (Signed)

## 2013-11-27 NOTE — ED Provider Notes (Signed)
CSN: [id number]     Arrival date & time 11/27/13  1443 History   First MD Initiated Contact with Patient 11/27/13 1551     Chief Complaint  Patient presents with  . Chest Pain   HPI Pt has been having pain in her chest since Friday.  The pain is in the midsternum upper abdomen.  It pain increases with breathing.  It is sharp and moves toward her throat.  She feels like something is balled up in her throat.  No leg swelling.  NO history of heart disease.  She does have a cough, non productive.  No history of heart disease, DVT, PE. Past Medical History  Diagnosis Date  . 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 Acute meniscal tear of knee     RIGHT  . Depression    Past Surgical History  Procedure Laterality Date  . Lumbar fusion  03-20-2010    L4 -- L5  . Ectopic pregnancy surgery  1991  . Knee arthroscopy with medial menisectomy Right 11/26/2012    Procedure: KNEE ARTHROSCOPY WITH PARTIAL MEDIAL MENISECTOMY,DEBRIDEMENT;  Surgeon: Javier DockerJeffrey C Beane, MD;  [facility];  Service: Orthopedics;  Laterality: Right;   No family history on file. History  Substance Use Topics  . Smoking status: Former Smoker -- 34 years    Types: Cigarettes  . Smokeless tobacco: Never Used     Comment: smokes 3 cig daily  . Alcohol Use: Yes     Comment: occasonal   OB History   Grav Para Term Preterm Abortions TAB SAB Ect Mult Living                 Review of Systems  All other systems reviewed and are negative.     Allergies  Aspirin and Morphine and related  Home Medications   Current Outpatient Rx  Name  Route  Sig  Dispense  Refill  . amitriptyline (ELAVIL) 50 MG tablet   Oral   Take 50 mg by mouth at bedtime as needed for sleep.         Marland Kitchen. gabapentin (NEURONTIN) 600 MG tablet   Oral   Take 600 mg by mouth 2 (two) times daily.         Marland Kitchen. HYDROcodone-acetaminophen (NORCO/VICODIN) 5-325 MG per tablet   Oral   Take 1 tablet by mouth every 6  (six) hours as needed for moderate pain.         . benzonatate (TESSALON) 100 MG capsule   Oral   Take 1 capsule (100 mg total) by mouth every 8 (eight) hours.   21 capsule   0   . omeprazole (PRILOSEC) 20 MG capsule   Oral   Take 1 capsule (20 mg total) by mouth daily.   14 capsule   0    BP 109/68  Pulse 72  Temp(Src) 97.9 F (36.6 C) (Oral)  Resp 22  Ht 5\' 8"  (1.727 m)  Wt 210 lb (95.255 kg)  BMI 31.94 kg/m2  SpO2 99%  LMP 04/26/2012 Physical Exam  Nursing note and vitals reviewed. Constitutional: She appears well-developed and well-nourished. No distress.  HENT:  Head: Normocephalic and atraumatic.  Right Ear: External ear normal.  Left Ear: External ear normal.  Eyes: Conjunctivae are normal. Right eye exhibits no discharge. Left eye exhibits no discharge. No scleral icterus.  Neck: Neck supple. No tracheal deviation present.  Cardiovascular: Normal rate, regular rhythm and intact distal pulses.  Pulmonary/Chest: Effort normal and breath sounds normal. No stridor. No respiratory distress. She has no wheezes. She has no rales.  Abdominal: Soft. Bowel sounds are normal. She exhibits no distension. There is tenderness in the epigastric area. There is no rigidity, no rebound and no guarding. No hernia.  Mild ttp  Musculoskeletal: She exhibits no edema and no tenderness.  Neurological: She is alert. She has normal strength. No cranial nerve deficit (no facial droop, extraocular movements intact, no slurred speech) or sensory deficit. She exhibits normal muscle tone. She displays no seizure activity. Coordination normal.  Skin: Skin is warm and dry. No rash noted.  Psychiatric: She has a normal mood and affect.    ED Course  Procedures (including critical care time) Labs Review Labs Reviewed  PRO B NATRIURETIC PEPTIDE - Abnormal; Notable for the following:    Pro B Natriuretic peptide (BNP) 217.1 (*)    All other components within normal limits  CBC  BASIC  METABOLIC PANEL  HEPATIC FUNCTION PANEL  LIPASE, BLOOD  I-STAT TROPOININ, ED   Imaging Review Dg Chest 2 View  11/27/2013   CLINICAL DATA:  Cough, palpable sub xiphoid mass per patient  EXAM: CHEST  2 VIEW  COMPARISON:  CT ABD/PELV WO CM dated 04/07/2010; DG CHEST 2 VIEW dated 06/07/2009  FINDINGS: The heart size and mediastinal contours are within normal limits. Both lungs are clear. The visualized skeletal structures are unremarkable. The aorta is unfolded and ectatic.  IMPRESSION: No active cardiopulmonary disease.   Electronically Signed   By: Christiana Pellant M.D.   On: 11/27/2013 16:55   Medications  gi cocktail (Maalox,Lidocaine,Donnatal) (30 mLs Oral Given 11/27/13 1613)     MDM   Final diagnoses:  Cough  Esophagitis    Patient's had symptoms of some cough as well as some laryngitis associated with the discomfort in her chest. The patient's symptoms to suggest possibility of gastroesophageal reflux as the etiology of some of her symptoms. I doubt acute coronary syndrome. Patient's cardiac enzymes and EKG unremarkable. There is no evidence of pneumonia on her chest x-ray. She is low risk for PE, I doubt pulmonary embolism.  To treat the patient with Tessalon and a proton pump inhibitor. Recommend followup with primary care Dr.   Celene Kras, MD 11/27/13 [phone]

## 2013-12-26 ENCOUNTER — Encounter (HOSPITAL_COMMUNITY): Payer: Self-pay | Admitting: Emergency Medicine

## 2013-12-26 ENCOUNTER — Emergency Department (HOSPITAL_COMMUNITY)
Admission: EM | Admit: 2013-12-26 | Discharge: 2013-12-26 | Disposition: A | Discharge status: Home / Self Care | Payer: Medicare Other | Attending: Emergency Medicine | Admitting: Emergency Medicine

## 2013-12-26 ENCOUNTER — Emergency Department (HOSPITAL_COMMUNITY): Payer: Medicare Other

## 2013-12-26 DIAGNOSIS — Z87891 Personal history of nicotine dependence: Secondary | ICD-10-CM | POA: Insufficient documentation

## 2013-12-26 DIAGNOSIS — Z79899 Other long term (current) drug therapy: Secondary | ICD-10-CM | POA: Insufficient documentation

## 2013-12-26 DIAGNOSIS — F329 Major depressive disorder, single episode, unspecified: Secondary | ICD-10-CM | POA: Insufficient documentation

## 2013-12-26 DIAGNOSIS — Y939 Activity, unspecified: Secondary | ICD-10-CM | POA: Insufficient documentation

## 2013-12-26 DIAGNOSIS — S9030XA Contusion of unspecified foot, initial encounter: Secondary | ICD-10-CM | POA: Insufficient documentation

## 2013-12-26 DIAGNOSIS — Z8739 Personal history of other diseases of the musculoskeletal system and connective tissue: Secondary | ICD-10-CM | POA: Insufficient documentation

## 2013-12-26 DIAGNOSIS — Y929 Unspecified place or not applicable: Secondary | ICD-10-CM | POA: Insufficient documentation

## 2013-12-26 DIAGNOSIS — F3289 Other specified depressive episodes: Secondary | ICD-10-CM | POA: Insufficient documentation

## 2013-12-26 DIAGNOSIS — S9032XA Contusion of left foot, initial encounter: Secondary | ICD-10-CM

## 2013-12-26 DIAGNOSIS — W208XXA Other cause of strike by thrown, projected or falling object, initial encounter: Secondary | ICD-10-CM | POA: Insufficient documentation

## 2013-12-26 MED ORDER — OXYCODONE-ACETAMINOPHEN 5-325 MG PO TABS
1.0000 | ORAL_TABLET | Freq: Once | ORAL | Status: AC
  Administered 2013-12-26: 1 via ORAL
  Filled 2013-12-26: qty 1

## 2013-12-26 MED ORDER — HYDROCODONE-ACETAMINOPHEN 5-325 MG PO TABS: 1.0000 | ORAL_TABLET | Freq: Four times a day (QID) | ORAL | Status: DC | PRN
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
## 2013-12-26 NOTE — Progress Notes (Signed)
Orthopedic Tech Progress Note Patient Details:  Martha HakeBarbara B Buck 11/10/1960 [id number]  Ortho Devices Type of Ortho Device: Postop shoe/boot Ortho Device/Splint Location: LLE Ortho Device/Splint Interventions: Ordered;Application   Jennye MoccasinAnthony Craig Kimmie Berggren 12/26/2013, 9:57 PM
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
## 2013-12-26 NOTE — Discharge Instructions (Signed)
Foot Contusion °A foot contusion is a deep bruise to the foot. Contusions are the result of an injury that caused bleeding under the skin. The contusion may turn blue, purple, or yellow. Minor injuries will give you a painless contusion, but more severe contusions may stay painful and swollen for a few weeks. °CAUSES  °A foot contusion comes from a direct blow to that area, such as a heavy object falling on the foot. °SYMPTOMS  °· Swelling of the foot. °· Discoloration of the foot. °· Tenderness or soreness of the foot. °DIAGNOSIS  °You will have a physical exam and will be asked about your history. You may need an X-ray of your foot to look for a broken bone (fracture).  °TREATMENT  °An elastic wrap may be recommended to support your foot. Resting, elevating, and applying cold compresses to your foot are often the best treatments for a foot contusion. Over-the-counter medicines may also be recommended for pain control. °HOME CARE INSTRUCTIONS  °· Put ice on the injured area. °· Put ice in a plastic bag. °· Place a towel between your skin and the bag. °· Leave the ice on for 15-20 minutes, 03-04 times a day. °· Only take over-the-counter or prescription medicines for pain, discomfort, or fever as directed by your caregiver. °· If told, use an elastic wrap as directed. This can help reduce swelling. You may remove the wrap for sleeping, showering, and bathing. If your toes become numb, cold, or blue, take the wrap off and reapply it more loosely. °· Elevate your foot with pillows to reduce swelling. °· Try to avoid standing or walking while the foot is painful. Do not resume use until instructed by your caregiver. Then, begin use gradually. If pain develops, decrease use. Gradually increase activities that do not cause discomfort until you have normal use of your foot. °· See your caregiver as directed. It is very important to keep all follow-up appointments in order to avoid any lasting problems with your foot,  including long-term (chronic) pain. °SEEK IMMEDIATE MEDICAL CARE IF:  °· You have increased redness, swelling, or pain in your foot. °· Your swelling or pain is not relieved with medicines. °· You have loss of feeling in your foot or are unable to move your toes. °· Your foot turns cold or blue. °· You have pain when you move your toes. °· Your foot becomes warm to the touch. °· Your contusion does not improve in 2 days. °MAKE SURE YOU:  °· Understand these instructions. °· Will watch your condition. °· Will get help right away if you are not doing well or get worse. °Document Released: 05/26/2006 Document Revised: 02/03/2012 Document Reviewed: 07/08/2011 °ExitCare® Patient Information ©2014 [employer]. ° °

## 2013-12-26 NOTE — ED Notes (Signed)
Patient refused crutches.

## 2013-12-26 NOTE — ED Provider Notes (Signed)
CSN: [id number]     Arrival date & time 12/26/13  [id number] History  This chart was scribed for non-physician practitioner Felicie Mornavid Monay Houlton, NP working with Gerhard Munchobert Lockwood, MD by Dorothey Basemania Sutton, ED Scribe. This patient was seen in room TR07C/TR07C and the patient's care was started at 8:34 PM.    Chief Complaint  Patient presents with  . Toe Injury   Patient is a 53 y.o. female presenting with foot injury. The history is provided by the patient. No language interpreter was used.  Foot Injury Location:  Foot and toe Injury: yes   Foot location:  L foot Toe location:  L big toe Pain details:    Quality:  Burning   Radiates to:  Does not radiate   Severity:  Severe   Onset quality:  Sudden   Timing:  Constant   Progression:  Worsening Chronicity:  New Prior injury to area:  No Relieved by:  Nothing Worsened by:  Bearing weight Ineffective treatments:  Ice Associated symptoms: swelling    HPI Comments: Martha Buck is a 53 y.o. female who presents to the Emergency Department complaining of a constant, burning pain to the left foot, most localized around the great toe, onset yesterday after she reports dropping a heavy car battery on the area. She reports associated swelling and erythema to the toe. Patient states that the pain has been progressively worsening and is exacerbated with walking/bearing weight. She reports applying ice to the area at home without significant relief. She denies history of prior injury to the area. Patient has allergies to aspirin and morphine. Patient has no other pertinent medical history.   Past Medical History  Diagnosis Date  . 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 Acute meniscal tear of knee     RIGHT  . Depression    Past Surgical History  Procedure Laterality Date  . Lumbar fusion  03-20-2010    L4 -- L5  . Ectopic pregnancy surgery  1991  . Knee arthroscopy with medial menisectomy Right 11/26/2012    Procedure: KNEE ARTHROSCOPY WITH  PARTIAL MEDIAL MENISECTOMY,DEBRIDEMENT;  Surgeon: Javier DockerJeffrey C Beane, MD;  [facility];  Service: Orthopedics;  Laterality: Right;   History reviewed. No pertinent family history. History  Substance Use Topics  . Smoking status: Former Smoker -- 34 years    Types: Cigarettes  . Smokeless tobacco: Never Used     Comment: smokes 3 cig daily  . Alcohol Use: Yes     Comment: occasonal   OB History   Grav Para Term Preterm Abortions TAB SAB Ect Mult Living                 Review of Systems  Musculoskeletal: Positive for arthralgias and joint swelling.  Skin: Positive for color change (erythema).  All other systems reviewed and are negative.   Allergies  Aspirin and Morphine and related  Home Medications   Prior to Admission medications   Medication Sig Start Date End Date Taking? Authorizing Provider  amitriptyline (ELAVIL) 50 MG tablet Take 50 mg by mouth at bedtime as needed for sleep.    Historical Provider, MD  benzonatate (TESSALON) 100 MG capsule Take 1 capsule (100 mg total) by mouth every 8 (eight) hours. 11/27/13   Celene KrasJon R Knapp, MD  gabapentin (NEURONTIN) 600 MG tablet Take 600 mg by mouth 2 (two) times daily.    Historical Provider, MD  HYDROcodone-acetaminophen (NORCO/VICODIN) 5-325 MG per tablet Take  1 tablet by mouth every 6 (six) hours as needed for moderate pain.    Historical Provider, MD  omeprazole (PRILOSEC) 20 MG capsule Take 1 capsule (20 mg total) by mouth daily. 11/27/13   Celene KrasJon R Knapp, MD   Triage Vitals: BP 109/72  Pulse 94  Temp(Src) 99.2 F (37.3 C) (Oral)  Resp 18  SpO2 99%  LMP 04/26/2012  Physical Exam  Nursing note and vitals reviewed. Constitutional: She is oriented to person, place, and time. She appears well-developed and well-nourished. No distress.  HENT:  Head: Normocephalic and atraumatic.  Eyes: Conjunctivae are normal.  Neck: Normal range of motion. Neck supple.  Cardiovascular: Normal rate, regular rhythm, normal  heart sounds and intact distal pulses.   Pulmonary/Chest: Effort normal and breath sounds normal. No respiratory distress.  Abdominal: She exhibits no distension.  Musculoskeletal: Normal range of motion. She exhibits tenderness. She exhibits no edema.  Tenderness across the dorsum of the left foot. No appreciable swelling.   Neurological: She is alert and oriented to person, place, and time.  Skin: Skin is warm and dry.  Psychiatric: She has a normal mood and affect. Her behavior is normal.    ED Course  Procedures (including critical care time)  DIAGNOSTIC STUDIES: Oxygen Saturation is 99% on room air, normal by my interpretation.    COORDINATION OF CARE: 7:14 PM- Ordered an x-ray of the left foot.   8:41 PM- Will order a dose of Percocet here. Discussed treatment plan with patient at bedside and patient verbalized agreement.   9:52 PM- Discussed that x-ray results were negative. Will discharge patient with a post-op shoe to manage symptoms.  She has crutches at home and [facility]. Discussed treatment plan with patient at bedside and patient verbalized agreement.   Labs Review Labs Reviewed - No data to display  Imaging Review Dg Foot Complete Left  12/26/2013   CLINICAL DATA:  Toe injury. Patient dropped a heavy object on foot. Pain.  EXAM: LEFT FOOT - COMPLETE 3+ VIEW  COMPARISON:  DG FOOT COMPLETE 3+V*L* dated 03/31/2012  FINDINGS: There is no evidence of fracture or dislocation. There is no evidence of arthropathy or other focal bone abnormality. Soft tissues are unremarkable.  IMPRESSION: Negative.   Electronically Signed   By: Burman NievesWilliam  Stevens M.D.   On: 12/26/2013 21:15     EKG Interpretation None      MDM   Final diagnoses:  None    Foot contusion.  I personally performed the services described in this documentation, which was scribed in my presence. The recorded information has been reviewed and is accurate.     Jimmye Normanavid John Saman Giddens,  NP 12/26/13 [phone]

## 2013-12-27 NOTE — ED Provider Notes (Addendum)
  Medical screening examination/treatment/procedure(s) were performed by non-physician practitioner and as supervising physician I was immediately available for consultation/collaboration.   EKG Interpretation None           Gerhard Munchobert Christna Kulick, MD 12/27/13 0006  Gerhard Munchobert Addalyne Vandehei, MD 12/27/13 [id number]

## 2014-01-13 ENCOUNTER — Encounter (HOSPITAL_COMMUNITY): Payer: Self-pay | Admitting: Emergency Medicine

## 2014-01-13 ENCOUNTER — Emergency Department (HOSPITAL_COMMUNITY): Payer: Medicare Other

## 2014-01-13 ENCOUNTER — Emergency Department (HOSPITAL_COMMUNITY)
Admission: EM | Admit: 2014-01-13 | Discharge: 2014-01-13 | Discharge status: Against Medical Advice | Payer: Medicare Other | Attending: Emergency Medicine | Admitting: Emergency Medicine

## 2014-01-13 DIAGNOSIS — R079 Chest pain, unspecified: Secondary | ICD-10-CM | POA: Insufficient documentation

## 2014-01-13 LAB — BASIC METABOLIC PANEL
BUN: 10 mg/dL (ref 6–23)
CHLORIDE: 106 meq/L (ref 96–112)
CO2: 23 mEq/L (ref 19–32)
Calcium: 8.6 mg/dL (ref 8.4–10.5)
Creatinine, Ser: 0.73 mg/dL (ref 0.50–1.10)
GFR calc Af Amer: >90 mL/min (ref >90)
GFR calc non Af Amer: >90 mL/min (ref >90)
Glucose, Bld: 153 mg/dL — ABNORMAL HIGH (ref 70–99)
Potassium: 3.6 mEq/L — ABNORMAL LOW (ref 3.7–5.3)
SODIUM: 143 meq/L (ref 137–147)

## 2014-01-13 LAB — CBC
HCT: 38.1 % (ref 36.0–46.0)
Hemoglobin: 12.6 g/dL (ref 12.0–15.0)
MCH: 31.7 pg (ref 26.0–34.0)
MCHC: 33.1 g/dL (ref 30.0–36.0)
MCV: 95.7 fL (ref 78.0–100.0)
PLATELETS: 242 10*3/uL (ref 150–400)
RBC: 3.98 MIL/uL (ref 3.87–5.11)
RDW: 13.7 % (ref 11.5–15.5)
WBC: 10.2 10*3/uL (ref 4.0–10.5)

## 2014-01-13 LAB — I-STAT TROPONIN, ED: TROPONIN I, POC: 0 ng/mL (ref 0.00–0.08)

## 2014-01-13 LAB — PRO B NATRIURETIC PEPTIDE: Pro B Natriuretic peptide (BNP): 128.7 pg/mL — ABNORMAL HIGH (ref 0–125)

## 2014-01-13 NOTE — ED Notes (Addendum)
Pt came up to nurse first after walking out of treatment room. Pt was using profanity stating the she was leaving. Pt began to speak aggressively towards another patient, as she was walking out.

## 2014-01-13 NOTE — ED Notes (Signed)
Pt reports central chest "throbbing" x 2 days associated with SOB, dizziness, and diaphoresis. Pt denies anything makes worse or better. NAD. VSS.

## 2014-01-13 NOTE — ED Notes (Signed)
Pt not in room.  Another family member lying on stretcher and states she thinks pt went to bathroom.

## 2014-01-13 NOTE — ED Notes (Addendum)
Pt back to room and is changing out of gown and into her clothes.  Pt states she is leaving because she has been here since 6:45pm and has not seen a doctor.  Explained to pt that I was her nurse and was coming in to assess her and put her on cardiac monitor.  Apologized for wait in the waiting room.  Pt states that she is ready to go and tired of waiting.  Encouraged pt to stay.  Pt states she is going to [employer].

## 2014-02-02 ENCOUNTER — Encounter: Payer: Self-pay | Admitting: Physical Medicine & Rehabilitation

## 2014-02-24 ENCOUNTER — Other Ambulatory Visit: Payer: Self-pay | Admitting: Orthopedic Surgery

## 2014-02-24 DIAGNOSIS — M549 Dorsalgia, unspecified: Principal | ICD-10-CM

## 2014-02-24 DIAGNOSIS — G8929 Other chronic pain: Secondary | ICD-10-CM

## 2014-03-01 ENCOUNTER — Ambulatory Visit
Admission: RE | Admit: 2014-03-01 | Discharge: 2014-03-01 | Disposition: A | Discharge status: Home / Self Care | Payer: Medicare Other | Source: Ambulatory Visit | Attending: Orthopedic Surgery | Admitting: Orthopedic Surgery

## 2014-03-01 VITALS — BP 80/52 | HR 61

## 2014-03-01 DIAGNOSIS — G8929 Other chronic pain: Secondary | ICD-10-CM

## 2014-03-01 DIAGNOSIS — M549 Dorsalgia, unspecified: Principal | ICD-10-CM

## 2014-03-01 MED ORDER — DIAZEPAM 5 MG PO TABS
10.0000 mg | ORAL_TABLET | Freq: Once | ORAL | Status: AC
  Administered 2014-03-01: 10 mg via ORAL

## 2014-03-01 MED ORDER — IOHEXOL 180 MG/ML  SOLN
15.0000 mL | Freq: Once | INTRAMUSCULAR | Status: AC | PRN
  Administered 2014-03-01: 15 mL via INTRATHECAL

## 2014-03-01 NOTE — Discharge Instructions (Signed)

## 2014-04-04 ENCOUNTER — Ambulatory Visit: Payer: Medicare Other | Admitting: Physical Medicine & Rehabilitation

## 2014-05-01 ENCOUNTER — Encounter: Payer: Medicare Other | Attending: Physical Medicine & Rehabilitation

## 2014-05-01 ENCOUNTER — Ambulatory Visit: Payer: Medicare Other | Admitting: Physical Medicine & Rehabilitation

## 2014-06-26 DIAGNOSIS — Z6831 Body mass index (BMI) 31.0-31.9, adult: Secondary | ICD-10-CM | POA: Insufficient documentation

## 2014-06-26 DIAGNOSIS — M4716 Other spondylosis with myelopathy, lumbar region: Secondary | ICD-10-CM | POA: Insufficient documentation

## 2014-10-13 ENCOUNTER — Emergency Department (HOSPITAL_COMMUNITY)
Admission: EM | Admit: 2014-10-13 | Discharge: 2014-10-13 | Disposition: A | Discharge status: Home / Self Care | Payer: Medicare Other | Attending: Emergency Medicine | Admitting: Emergency Medicine

## 2014-10-13 ENCOUNTER — Encounter (HOSPITAL_COMMUNITY): Payer: Self-pay | Admitting: *Deleted

## 2014-10-13 ENCOUNTER — Emergency Department (HOSPITAL_COMMUNITY): Payer: Medicare Other

## 2014-10-13 DIAGNOSIS — E041 Nontoxic single thyroid nodule: Secondary | ICD-10-CM | POA: Diagnosis not present

## 2014-10-13 DIAGNOSIS — Z3202 Encounter for pregnancy test, result negative: Secondary | ICD-10-CM | POA: Insufficient documentation

## 2014-10-13 DIAGNOSIS — R490 Dysphonia: Secondary | ICD-10-CM | POA: Insufficient documentation

## 2014-10-13 DIAGNOSIS — Z79899 Other long term (current) drug therapy: Secondary | ICD-10-CM | POA: Diagnosis not present

## 2014-10-13 DIAGNOSIS — Z720 Tobacco use: Secondary | ICD-10-CM | POA: Diagnosis not present

## 2014-10-13 DIAGNOSIS — R509 Fever, unspecified: Secondary | ICD-10-CM | POA: Diagnosis present

## 2014-10-13 DIAGNOSIS — Z87828 Personal history of other (healed) physical injury and trauma: Secondary | ICD-10-CM | POA: Insufficient documentation

## 2014-10-13 DIAGNOSIS — Z8739 Personal history of other diseases of the musculoskeletal system and connective tissue: Secondary | ICD-10-CM | POA: Insufficient documentation

## 2014-10-13 DIAGNOSIS — R5383 Other fatigue: Secondary | ICD-10-CM | POA: Insufficient documentation

## 2014-10-13 LAB — I-STAT CHEM 8, ED
BUN: 26 mg/dL — ABNORMAL HIGH (ref 6–23)
CALCIUM ION: 1.17 mmol/L (ref 1.12–1.23)
CHLORIDE: 106 mmol/L (ref 96–112)
Creatinine, Ser: 0.8 mg/dL (ref 0.50–1.10)
Glucose, Bld: 81 mg/dL (ref 70–99)
HCT: 42 % (ref 36.0–46.0)
Hemoglobin: 14.3 g/dL (ref 12.0–15.0)
POTASSIUM: 3.6 mmol/L (ref 3.5–5.1)
Sodium: 142 mmol/L (ref 135–145)
TCO2: 21 mmol/L (ref 0–100)

## 2014-10-13 LAB — POC URINE PREG, ED: Preg Test, Ur: NEGATIVE

## 2014-10-13 MED ORDER — IOHEXOL 300 MG/ML  SOLN
75.0000 mL | Freq: Once | INTRAMUSCULAR | Status: AC | PRN
  Administered 2014-10-13: 75 mL via INTRAVENOUS

## 2014-10-13 NOTE — Discharge Instructions (Signed)
-   A left thyroid nodule was seen on your CT scan. It is very important that you follow up with your primary care provider for follow-up and possible outpatient ultrasound. - Follow-up with an ENT physician as outlined above at the earliest available appointment for follow-up of your hoarseness - . Return to the emergency department if he develops shortness of breath, difficulty swallowing, inability to speak at all, or other concerning symptoms  Hoarseness Hoarseness is produced from a variety of causes. It is important to find the cause so it can be treated. In the absence of a cold or upper respiratory illness, any hoarseness lasting more than 2 weeks should be looked at by a specialist. This is especially important if you have a history of smoking or alcohol use. It is also important to keep in mind that as you grow older, your voice will naturally get weaker, making it easier for you to become hoarse from straining your vocal cords.  CAUSES  Any illness that affects your vocal cords can result in a hoarse voice. Examples of conditions that can affect the vocal cords are listed as follows:   Allergies.  Colds.  Sinusitis.  Gastroesophageal reflux disease.  Croup.  Injury.  Nodules.  Exposure to smoke or toxic fumes or gases.  Congenital and genetic defects.  Paralysis of the vocal cords.  Infections.  Advanced age. DIAGNOSIS  In order to diagnose the cause of your hoarseness, your caregiver will examine your throat using an instrument that uses a tube with a small lighted camera (laryngoscope). It allows your caregiver to look into the mouth and down the throat. TREATMENT  For most cases, treatment will focus on the specific cause of the hoarseness. Depending on the cause, hoarseness can be a temporary condition (acute) or it can be long lasting (chronic). Most cases of hoarseness clear up without complications. Your caregiver will explain to you if this is not likely to  happen. SEEK IMMEDIATE MEDICAL CARE IF:   You have increasing hoarseness or loss of voice.  You have shortness of breath.  You are coughing up blood.  There is pain in your neck or throat. Document Released: 07/18/2005 Document Revised: 10/27/2011 Document Reviewed: 10/10/2010 [facility] Patient Information 2015 [place]. This information is not intended to replace advice given to you by your health care provider. Make sure you discuss any questions you have with your health care provider.

## 2014-10-13 NOTE — ED Notes (Signed)
Pt reports feeling bad x 1 month. Having hoarseness but denies sore throat or cough. Pt reports feeling hot and possible fever. Airway intact.

## 2014-10-13 NOTE — ED Provider Notes (Signed)
CSN: [id number]     Arrival date & time 10/13/14  1548 History   First MD Initiated Contact with Patient 10/13/14 1701     Chief Complaint  Patient presents with  . Fever  . Hoarse     (Consider location/radiation/quality/duration/timing/severity/associated sxs/prior Treatment) HPI   54 year old female with past medical history of tobacco abuse and degenerative joint disease who presents with a one-month history of progressively worsening hoarseness and high-pitched voice. The patient states her symptoms began 1 month ago, when she decided to quit smoking. She states that she has had progressively worsening high-pitched voice, as well as hoarseness and is unable to speak in long paragraphs anymore. She has not been evaluated by her PCP for these symptoms. She also endorses subjective hot flashes as well as weight loss over the same time. She denies any associated sore throat. She denies any difficulty swallowing as been tolerating by mouth intake without difficulty. She denies any associated cough. No recent fevers or chills. She denies any trauma to the neck. Denies any posterior midline tenderness. Denies any shortness of breath  Past Medical History  Diagnosis Date  . 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 Acute meniscal tear of knee     RIGHT  . Depression    Past Surgical History  Procedure Laterality Date  . Lumbar fusion  03-20-2010    L4 -- L5  . Ectopic pregnancy surgery  1991  . Knee arthroscopy with medial menisectomy Right 11/26/2012    Procedure: KNEE ARTHROSCOPY WITH PARTIAL MEDIAL MENISECTOMY,DEBRIDEMENT;  Surgeon: Javier Docker, MD;  [facility];  Service: Orthopedics;  Laterality: Right;  . Back surgery     History reviewed. No pertinent family history. History  Substance Use Topics  . Smoking status: Current Every Day Smoker -- 0.50 packs/day for 34 years    Types: Cigarettes  . Smokeless tobacco: Never Used     Comment:  smokes 3 cig daily  . Alcohol Use: Yes     Comment: occasonal   OB History    No data available     Review of Systems  Constitutional: Positive for fatigue and unexpected weight change. Negative for fever and appetite change.  HENT: Positive for voice change. Negative for congestion, facial swelling, rhinorrhea, sore throat and trouble swallowing.   Eyes: Negative for visual disturbance.  Respiratory: Negative for cough, shortness of breath and wheezing.   Cardiovascular: Negative for chest pain and leg swelling.  Gastrointestinal: Negative for nausea, vomiting, abdominal pain and diarrhea.  Genitourinary: Negative for dysuria.  Musculoskeletal: Negative for neck pain.  Skin: Negative for rash.  Allergic/Immunologic: Negative for immunocompromised state.  Neurological: Negative for dizziness, weakness and headaches.      Allergies  Aspirin and Morphine and related  Home Medications   Prior to Admission medications   Medication Sig Start Date End Date Taking? Authorizing Provider  amitriptyline (ELAVIL) 50 MG tablet Take 50 mg by mouth at bedtime as needed for sleep.    Historical Provider, MD  gabapentin (NEURONTIN) 600 MG tablet Take 600 mg by mouth 2 (two) times daily.    Historical Provider, MD  HYDROcodone-acetaminophen (NORCO/VICODIN) 5-325 MG per tablet Take 1-2 tablets by mouth every 6 (six) hours as needed for severe pain. 12/26/13   Jimmye Norman, NP   BP 116/98 mmHg  Pulse 88  Temp(Src) 98.5 F (36.9 C) (Oral)  Resp 24  SpO2 98%  LMP 04/26/2012 Physical Exam  Constitutional: She is oriented to person, place, and time. She appears well-developed and well-nourished. No distress.  HENT:  Head: Normocephalic and atraumatic.  Mouth/Throat: No oropharyngeal exudate.  High-pitched voice noted with intermittent hoarseness but no apparent increased work of breathing. Oropharynx without any uvular or tonsillar swelling. No lingual swelling or tenderness. No lesions or  discoloration noted in the oral cavity. No peritonsillar swelling  Eyes: Conjunctivae are normal. Pupils are equal, round, and reactive to light.  Neck: Neck supple. No JVD present.  No appreciable thyromegaly. No carotid or thyroid bruits. No palpable masses. No tender or enlarged lymphadenopathy. Range of motion is full and painless. No midline tenderness to palpation  Cardiovascular: Normal rate, normal heart sounds and intact distal pulses.  Exam reveals no friction rub.   No murmur heard. Pulmonary/Chest: Effort normal and breath sounds normal. No stridor. No respiratory distress. She has no wheezes. She has no rales.  Abdominal: Soft. Bowel sounds are normal. She exhibits no distension. There is no tenderness.  Musculoskeletal: She exhibits no edema.  Neurological: She is oriented to person, place, and time.  Skin: Skin is warm. No rash noted.  Nursing note and vitals reviewed.   ED Course  Procedures (including critical care time) Labs Review Labs Reviewed  I-STAT CHEM 8, ED - Abnormal; Notable for the following:    BUN 26 (*)    All other components within normal limits  POC URINE PREG, ED    Imaging Review Dg Chest 2 View  10/13/2014   CLINICAL DATA:  Shortness of breath and laryngitis symptoms since stopping smoking 1 month ago  EXAM: CHEST  2 VIEW  COMPARISON:  PA and lateral chest x-ray of Jan 13, 2014  FINDINGS: The lungs are reasonably well inflated. The interstitial markings are chronically increased. There is no alveolar infiltrate. The heart and pulmonary vascularity are normal. There is no pleural effusion. The mediastinum is normal in width. The bony thorax exhibits no acute abnormality.  IMPRESSION: Chronically increased interstitial markings likely reflects the patient's smoking history. No acute cardiopulmonary abnormality is demonstrated.   Electronically Signed   By: David  [place]   On: 10/13/2014 16:41   Ct Soft Tissue Neck W Contrast  10/13/2014   CLINICAL DATA:   Intermittent hoarseness for a week.  EXAM: CT NECK WITH CONTRAST  TECHNIQUE: Multidetector CT imaging of the neck was performed using the standard protocol following the bolus administration of intravenous contrast.  CONTRAST:  75mL OMNIPAQUE IOHEXOL 300 MG/ML  SOLN  COMPARISON:  CT neck 06/24/2012  FINDINGS: Pharynx and larynx: No tonsillar enlargement evident. Base the tongue is grossly normal. The epiglottis vallecula appear normal. Piriform sinus appear normal. The vocal cords appear normal. No evidence obstruction or mass lesion.  The parapharyngeal spaces history normal fat plane. Trachea is normal.  Salivary glands: Chronic glands are normal. Submandibular glands are normal.  Thyroid: Thyroid gland is enlarged with the left gland greater than right. There is a 2.3 cm nodule within the left lobe. This is increased in size from 2013.  Lymph nodes: Bilateral level 2 lymph nodes which are not pathologic by size criteria measuring less than 10 mm  Vascular: Carotid tissues are normal.  Limited intracranial: No intracranial findings basilar brain orbits are normal.  Visualized orbits: Orbits are normal.  Mastoids and visualized paranasal sinuses: Normal  Skeleton: No aggressive osseous lesion  Upper chest: No adenopathy in thoracic inlet. Exam only includes the upper aspect of the the aortic arch. AP windows excluded.  No pulmonary lesion.  IMPRESSION: 1. Vocal cords appear normal. 2. No mass in the hypopharynx or supraglottic tissue. 3. No lymphadenopathy. 4. Enlarged thyroid gland with discrete enlarging nodule within the left lobe. Recommend thyroid ultrasound for further evaluation and consider thyroid hormone evaluation . 5. Only upper mediastinum imaged.  AP window excluded.   Electronically Signed   By: Genevive BiStewart  Edmunds M.D.   On: 10/13/2014 18:34     EKG Interpretation None      MDM   Final diagnoses:  Hoarseness  Thyroid nodule    54 year old female with extensive smoking history who  presents with hoarseness for one month. See history of present illness above. On arrival, patient afebrile with stable and normal vital signs. Satting 100% on room air. Exam as above. Remarkable for hoarseness, but otherwise no abnormalities noted in the or oropharynx or neck.  Patient's presentation is possibly concerning for laryngeal mass, particularly vocal cord or other cancer in the setting of extensive smoking history. No stridor or apparent airway collapse is noted on exam and she has normal work of breathing. Differential includes possible infectious or irritant laryngitis. No appreciable thyromegaly. On my exam. No evidence of expanding neck hematoma or mass to suggest compressive affect. No dysphagia or signs of esophageal stricture or abnormality and she is tolerating by mouth without difficulty. Although she has normal vitals and no apparent respiratory distress at this time, given her  extensive smoking history, will obtain CTA of the neck for further evaluation.  Urine pregnancy negative. Chemistries negative. CT scan of the neck shows normal vocal cords as well as no mass in the hypopharynx or supraglottic tissue. There is no lymphadenopathy. Patient does have a large thyroid gland with left-sided thyroid nodule, which will need outpatient follow-up. Chest x-ray is clear. Given absence of emergent findings on CT neck, as well as no signs of airway compromise, will discharge with outpatient referral to ENT as well as PCP for evaluation of thyroid nodule and possible ultrasound. The CT findings as well as the urgent need for ENT follow-up were discussed in detail with the patient and she expressed understanding.  Clinical Impression: 1. Thyroid nodule   2. Hoarseness     Disposition: Discharge  Condition: Good  I have discussed the results, Dx and Tx plan with the pt(& family if present). He/she/they expressed understanding and agree(s) with the plan. Discharge instructions discussed at  great length. Strict return precautions discussed and pt &/or family have verbalized understanding of the instructions. No further questions at time of discharge.    Discharge Medication List as of 10/13/2014  8:05 PM      Follow Up: Osborn Cohoavid Shoemaker, MD [address] [place] [address] [phone]   [facility] at earliest available appointment for management of your hoarseness.  [facility]     [address]  [place] [id number] [phone]  Follow-up with your PCP in 1 week for repeat exam and schedule of outpatient Thyroid Scan. If you do not have a PCP, call this number to set up an appointment with one.   Pt seen in conjunction with Dr. Bethann PunchesPickering     Lucillie Kiesel, MD 10/14/14 0127  Juliet RudeNathan R. Rubin PayorPickering, MD 10/16/14 [phone]

## 2014-11-27 ENCOUNTER — Encounter (HOSPITAL_COMMUNITY): Payer: Self-pay | Admitting: Emergency Medicine

## 2014-11-27 ENCOUNTER — Emergency Department (HOSPITAL_COMMUNITY)
Admission: EM | Admit: 2014-11-27 | Discharge: 2014-11-27 | Disposition: A | Discharge status: Home / Self Care | Payer: Medicare Other | Attending: Emergency Medicine | Admitting: Emergency Medicine

## 2014-11-27 DIAGNOSIS — G8929 Other chronic pain: Secondary | ICD-10-CM

## 2014-11-27 DIAGNOSIS — Z79899 Other long term (current) drug therapy: Secondary | ICD-10-CM | POA: Insufficient documentation

## 2014-11-27 DIAGNOSIS — F329 Major depressive disorder, single episode, unspecified: Secondary | ICD-10-CM | POA: Diagnosis not present

## 2014-11-27 DIAGNOSIS — M545 Low back pain, unspecified: Secondary | ICD-10-CM

## 2014-11-27 DIAGNOSIS — M25561 Pain in right knee: Secondary | ICD-10-CM | POA: Insufficient documentation

## 2014-11-27 DIAGNOSIS — Z87828 Personal history of other (healed) physical injury and trauma: Secondary | ICD-10-CM | POA: Diagnosis not present

## 2014-11-27 DIAGNOSIS — Z720 Tobacco use: Secondary | ICD-10-CM | POA: Insufficient documentation

## 2014-11-27 MED ORDER — NAPROXEN 375 MG PO TABS: 375.0000 mg | ORAL_TABLET | Freq: Two times a day (BID) | ORAL | Status: DC

## 2014-11-27 MED ORDER — LIDOCAINE 5 % EX PTCH: 1.0000 | MEDICATED_PATCH | CUTANEOUS | Status: DC

## 2014-11-27 MED ORDER — KETOROLAC TROMETHAMINE 60 MG/2ML IM SOLN
60.0000 mg | Freq: Once | INTRAMUSCULAR | Status: DC
  Filled 2014-11-27: qty 2

## 2014-11-27 NOTE — ED Notes (Signed)
Pt c/o lower back pain and right knee pain all chronic in nature

## 2014-11-27 NOTE — ED Notes (Signed)
Pt reports she does not take straight up shots. Pt reports I only take shots when I am  Numbed up.

## 2014-11-27 NOTE — Discharge Instructions (Signed)
Arthralgia °Your caregiver has diagnosed you as suffering from an arthralgia. Arthralgia means there is pain in a joint. This can come from many reasons including: °· Bruising the joint which causes soreness (inflammation) in the joint. °· Wear and tear on the joints which occur as we grow older (osteoarthritis). °· Overusing the joint. °· Various forms of arthritis. °· Infections of the joint. °Regardless of the cause of pain in your joint, most of these different pains respond to anti-inflammatory drugs and rest. The exception to this is when a joint is infected, and these cases are treated with antibiotics, if it is a bacterial infection. °HOME CARE INSTRUCTIONS  °· Rest the injured area for as long as directed by your caregiver. Then slowly start using the joint as directed by your caregiver and as the pain allows. Crutches as directed may be useful if the ankles, knees or hips are involved. If the knee was splinted or casted, continue use and care as directed. If an stretchy or elastic wrapping bandage has been applied today, it should be removed and re-applied every 3 to 4 hours. It should not be applied tightly, but firmly enough to keep swelling down. Watch toes and feet for swelling, bluish discoloration, coldness, numbness or excessive pain. If any of these problems (symptoms) occur, remove the ace bandage and re-apply more loosely. If these symptoms persist, contact your caregiver or return to this location. °· For the first 24 hours, keep the injured extremity elevated on pillows while lying down. °· Apply ice for 15-20 minutes to the sore joint every couple hours while awake for the first half day. Then 03-04 times per day for the first 48 hours. Put the ice in a plastic bag and place a towel between the bag of ice and your skin. °· Wear any splinting, casting, elastic bandage applications, or slings as instructed. °· Only take over-the-counter or prescription medicines for pain, discomfort, or fever as  directed by your caregiver. Do not use aspirin immediately after the injury unless instructed by your physician. Aspirin can cause increased bleeding and bruising of the tissues. °· If you were given crutches, continue to use them as instructed and do not resume weight bearing on the sore joint until instructed. °Persistent pain and inability to use the sore joint as directed for more than 2 to 3 days are warning signs indicating that you should see a caregiver for a follow-up visit as soon as possible. Initially, a hairline fracture (break in bone) may not be evident on X-rays. Persistent pain and swelling indicate that further evaluation, non-weight bearing or use of the joint (use of crutches or slings as instructed), or further X-rays are indicated. X-rays may sometimes not show a small fracture until a week or 10 days later. Make a follow-up appointment with your own caregiver or one to whom we have referred you. A radiologist (specialist in reading X-rays) may read your X-rays. Make sure you know how you are to obtain your X-ray results. Do not assume everything is normal if you do not hear from us. °SEEK MEDICAL CARE IF: °Bruising, swelling, or pain increases. °SEEK IMMEDIATE MEDICAL CARE IF:  °· Your fingers or toes are numb or blue. °· The pain is not responding to medications and continues to stay the same or get worse. °· The pain in your joint becomes severe. °· You develop a fever over 102° F (38.9° C). °· It becomes impossible to move or use the joint. °MAKE SURE YOU:  °·   Understand these instructions.  Will watch your condition.  Will get help right away if you are not doing well or get worse. Document Released: 08/04/2005 Document Revised: 10/27/2011 Document Reviewed: 03/22/2008 [facility] Patient Information 2015 [place], [place]. This information is not intended to replace advice given to you by your health care provider. Make sure you discuss any questions you have with your health care  provider.  Chronic Pain Chronic pain can be defined as pain that is off and on and lasts for 3-6 months or longer. Many things cause chronic pain, which can make it difficult to make a diagnosis. There are many treatment options available for chronic pain. However, finding a treatment that works well for you may require trying various approaches until the right one is found. Many people benefit from a combination of two or more types of treatment to control their pain. SYMPTOMS  Chronic pain can occur anywhere in the body and can range from mild to very severe. Some types of chronic pain include:  Headache.  Low back pain.  Cancer pain.  Arthritis pain.  Neurogenic pain. This is pain resulting from damage to nerves. People with chronic pain may also have other symptoms such as:  Depression.  Anger.  Insomnia.  Anxiety. DIAGNOSIS  Your health care provider will help diagnose your condition over time. In many cases, the initial focus will be on excluding possible conditions that could be causing the pain. Depending on your symptoms, your health care provider may order tests to diagnose your condition. Some of these tests may include:   Blood tests.   CT scan.   MRI.   X-rays.   Ultrasounds.   Nerve conduction studies.  You may need to see a specialist.  TREATMENT  Finding treatment that works well may take time. You may be referred to a pain specialist. He or she may prescribe medicine or therapies, such as:   Mindful meditation or yoga.  Shots (injections) of numbing or pain-relieving medicines into the spine or area of pain.  Local electrical stimulation.  Acupuncture.   Massage therapy.   Aroma, color, light, or sound therapy.   Biofeedback.   Working with a physical therapist to keep from getting stiff.   Regular, gentle exercise.   Cognitive or behavioral therapy.   Group support.  Sometimes, surgery may be recommended.  HOME CARE  INSTRUCTIONS   Take all medicines as directed by your health care provider.   Lessen stress in your life by relaxing and doing things such as listening to calming music.   Exercise or be active as directed by your health care provider.   Eat a healthy diet and include things such as vegetables, fruits, fish, and lean meats in your diet.   Keep all follow-up appointments with your health care provider.   Attend a support group with others suffering from chronic pain. SEEK MEDICAL CARE IF:   Your pain gets worse.   You develop a new pain that was not there before.   You cannot tolerate medicines given to you by your health care provider.   You have new symptoms since your last visit with your health care provider.  SEEK IMMEDIATE MEDICAL CARE IF:   You feel weak.   You have decreased sensation or numbness.   You lose control of bowel or bladder function.   Your pain suddenly gets much worse.   You develop shaking.  You develop chills.  You develop confusion.  You develop chest pain.  You develop shortness of breath.  MAKE SURE YOU:  Understand these instructions.  Will watch your condition.  Will get help right away if you are not doing well or get worse. Document Released: 04/26/2002 Document Revised: 04/06/2013 Document Reviewed: 01/28/2013 [facility] Patient Information 2015 [place]. This information is not intended to replace advice given to you by your health care provider. Make sure you discuss any questions you have with your health care provider.  Chronic Back Pain  When back pain lasts longer than 3 months, it is called chronic back pain.People with chronic back pain often go through certain periods that are more intense (flare-ups).  CAUSES Chronic back pain can be caused by wear and tear (degeneration) on different structures in your back. These structures include:  The bones of your spine (vertebrae) and the joints surrounding  your spinal cord and nerve roots (facets).  The strong, fibrous tissues that connect your vertebrae (ligaments). Degeneration of these structures may result in pressure on your nerves. This can lead to constant pain. HOME CARE INSTRUCTIONS  Avoid bending, heavy lifting, prolonged sitting, and activities which make the problem worse.  Take brief periods of rest throughout the day to reduce your pain. Lying down or standing usually is better than sitting while you are resting.  Take over-the-counter or prescription medicines only as directed by your caregiver. SEEK IMMEDIATE MEDICAL CARE IF:   You have weakness or numbness in one of your legs or feet.  You have trouble controlling your bladder or bowels.  You have nausea, vomiting, abdominal pain, shortness of breath, or fainting. Document Released: 09/11/2004 Document Revised: 10/27/2011 Document Reviewed: 07/19/2011 [facility] Patient Information 2015 [place]. This information is not intended to replace advice given to you by your health care provider. Make sure you discuss any questions you have with your health care provider.

## 2014-11-27 NOTE — ED Notes (Signed)
Declined W/C at D/C and was escorted to lobby by RN. 

## 2014-11-27 NOTE — ED Provider Notes (Signed)
CSN: [id number]     Arrival date & time 11/27/14  1640 History  This chart was scribed for Marlon Pel, PA-C, working with Blake Divine, MD by Elon Spanner, ED Scribe. This patient was seen in room TR09C/TR09C and the patient's care was started at 5:00 PM.   Chief Complaint  Patient presents with  . Knee Pain  . Back Pain   The history is provided by the patient. No language interpreter was used.   HPI Comments: Martha Buck is a 54 y.o. female with a history of chronic right knee pain and meniscus tear repair. She also has a history of back pain and back surgery after an injury at work a few years ago.  She reports she is not currently followed by any orthopaedist due to complications with her insurance.   She complains currently of lower lumbar pain and sharp right knee pain with radiation between each other.  Patient has used ice/heat, flexeril, and Tylenol without relief.  She reports when she was previously followed by an orthopaedist, she was prescribed Vicodin for pain.  She has crutches/walker and a knee brace at home.    These are chronic problems that she has been dealing with for years.and she is currently inbetween providers. She was going to [facility] for her knee and for workers comp but after a problem with Medicaid, she reports being changed to Dr. Eulah Pont and [place] office, she has not yet been approved to be seen here. She has been without Vicodin or Percocet for 3 months from her PCP or Orthopedist doctor and would like some pain medications .  Past Medical History  Diagnosis Date  . 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 Acute meniscal tear of knee     RIGHT  . Depression    Past Surgical History  Procedure Laterality Date  . Lumbar fusion  03-20-2010    L4 -- L5  . Ectopic pregnancy surgery  1991  . Knee arthroscopy with medial menisectomy Right 11/26/2012    Procedure: KNEE ARTHROSCOPY WITH PARTIAL MEDIAL MENISECTOMY,DEBRIDEMENT;   Surgeon: Javier Docker, MD;  Location: [facility];  Service: Orthopedics;  Laterality: Right;  . Back surgery     History reviewed. No pertinent family history. History  Substance Use Topics  . Smoking status: Current Every Day Smoker -- 0.50 packs/day for 34 years    Types: Cigarettes  . Smokeless tobacco: Never Used     Comment: smokes 3 cig daily  . Alcohol Use: Yes     Comment: occasonal   OB History    No data available     Review of Systems  Musculoskeletal: Positive for joint swelling and arthralgias.      Allergies  Aspirin and Morphine and related  Home Medications   Prior to Admission medications   Medication Sig Start Date End Date Taking? Authorizing Provider  acetaminophen (TYLENOL) 500 MG tablet Take 500 mg by mouth every 6 (six) hours as needed for mild pain.   Yes Historical Provider, MD  amitriptyline (ELAVIL) 50 MG tablet Take 50 mg by mouth at bedtime as needed for sleep.   Yes Historical Provider, MD  gabapentin (NEURONTIN) 600 MG tablet Take 600 mg by mouth 2 (two) times daily.   Yes Historical Provider, MD  HYDROcodone-acetaminophen (NORCO/VICODIN) 5-325 MG per tablet Take 1-2 tablets by mouth every 6 (six) hours as needed for severe pain. Patient not taking: Reported on 11/27/2014 12/26/13  Felicie Morn, NP  lidocaine (LIDODERM) 5 % Place 1 patch onto the skin daily. Remove & Discard patch within 12 hours or as directed by MD 11/27/14   Marlon Pel, PA-C  lidocaine (LIDODERM) 5 % Place 1 patch onto the skin daily. Remove & Discard patch within 12 hours or as directed by MD 11/27/14   Marlon Pel, PA-C  naproxen (NAPROSYN) 375 MG tablet Take 1 tablet (375 mg total) by mouth 2 (two) times daily. 11/27/14   Willam Munford Neva Seat, PA-C   BP 94/68 mmHg  Pulse 94  Temp(Src) 98 F (36.7 C)  Resp 15  Wt 215 lb 3 oz (97.608 kg)  SpO2 100%  LMP 04/26/2012 Physical Exam  Constitutional: She is oriented to person, place, and time. She appears  well-developed and well-nourished. No distress.  HENT:  Head: Normocephalic and atraumatic.  Eyes: Conjunctivae and EOM are normal.  Neck: Neck supple. No tracheal deviation present.  Cardiovascular: Normal rate.   Pulmonary/Chest: Effort normal. No respiratory distress.  Musculoskeletal: Normal range of motion.       Right knee: She exhibits effusion. She exhibits normal range of motion, no swelling, no ecchymosis, no deformity, no laceration, no erythema and no bony tenderness. Tenderness (mild diffuse) found. Patellar tendon tenderness noted. No medial joint line and no lateral joint line tenderness noted.       Legs: Pt has equal strength to bilateral lower extremities.  Neurosensory function adequate to both legs No clonus on dorsiflextion Skin color is normal. Skin is warm and moist.  I see no step off deformity, no midline bony tenderness.  Pt is able to ambulate.  No crepitus, laceration, effusion, induration, lesions, swelling.   Pedal pulses are symmetrical and palpable bilaterally     Neurological: She is alert and oriented to person, place, and time.  Skin: Skin is warm and dry.  Psychiatric: She has a normal mood and affect. Her behavior is normal.  Nursing note and vitals reviewed.   ED Course  Procedures (including critical care time)  DIAGNOSTIC STUDIES: Oxygen Saturation is 100% on RA, normal by my interpretation.    COORDINATION OF CARE:  5:13 PM Discussed treatment plan with patient at bedside.  Patient acknowledges and agrees with plan.    Labs Review Labs Reviewed - No data to display  Imaging Review No results found.   EKG Interpretation None      MDM   Final diagnoses:  Chronic knee pain, right  Bilateral low back pain without sciatica    Discussed with patient that we do not write Rx for chronic pain in the ED. I offered pain medication for in the ED, Percocet but she declined because she is driving. I offered her shot of Toradol but she  declined because she wanted to area to be numbed before a shot could be given. Rx: Lidoderm patches and naproxen. She has flexeril at home. Referral to Dr. [facility] office who she is waiting to see. Blood pressure is mildly low at 94/68, at previous visits I see her BP has been between 100-110 systolic and she tends to run low. No dizziness, weakness, fevers, syncope or systemic symptoms.  54 y.o.Martha Buck's evaluation in the Emergency Department is complete. It has been determined that no acute conditions requiring further emergency intervention are present at this time. The patient/guardian have been advised of the diagnosis and plan. We have discussed signs and symptoms that warrant return to the ED, such as changes or worsening in symptoms.  Vital signs are stable at discharge. Filed Vitals:   11/27/14 1646  BP: 94/68  Pulse: 94  Temp: 98 F (36.7 C)  Resp: 15    Patient/guardian has voiced understanding and agreed to follow-up with the PCP or specialist.   I personally performed the services described in this documentation, which was scribed in my presence. The recorded information has been reviewed and is accurate.    Marlon Peliffany Shiryl Ruddy, PA-C 11/29/14 1430  Blake DivineJohn Wofford, MD 12/01/14 1003

## 2015-02-21 ENCOUNTER — Emergency Department (HOSPITAL_COMMUNITY)
Admission: EM | Admit: 2015-02-21 | Discharge: 2015-02-22 | Disposition: A | Discharge status: Home / Self Care | Payer: Medicare Other | Attending: Emergency Medicine | Admitting: Emergency Medicine

## 2015-02-21 ENCOUNTER — Encounter (HOSPITAL_COMMUNITY): Payer: Self-pay | Admitting: Emergency Medicine

## 2015-02-21 DIAGNOSIS — Z720 Tobacco use: Secondary | ICD-10-CM | POA: Diagnosis not present

## 2015-02-21 DIAGNOSIS — Z79899 Other long term (current) drug therapy: Secondary | ICD-10-CM | POA: Diagnosis not present

## 2015-02-21 DIAGNOSIS — Y999 Unspecified external cause status: Secondary | ICD-10-CM | POA: Insufficient documentation

## 2015-02-21 DIAGNOSIS — H578 Other specified disorders of eye and adnexa: Secondary | ICD-10-CM | POA: Insufficient documentation

## 2015-02-21 DIAGNOSIS — Y929 Unspecified place or not applicable: Secondary | ICD-10-CM | POA: Insufficient documentation

## 2015-02-21 DIAGNOSIS — Z8739 Personal history of other diseases of the musculoskeletal system and connective tissue: Secondary | ICD-10-CM | POA: Diagnosis not present

## 2015-02-21 DIAGNOSIS — T63441A Toxic effect of venom of bees, accidental (unintentional), initial encounter: Secondary | ICD-10-CM | POA: Diagnosis present

## 2015-02-21 DIAGNOSIS — Y939 Activity, unspecified: Secondary | ICD-10-CM | POA: Diagnosis not present

## 2015-02-21 DIAGNOSIS — Z8659 Personal history of other mental and behavioral disorders: Secondary | ICD-10-CM | POA: Insufficient documentation

## 2015-02-21 MED ORDER — FAMOTIDINE IN NACL 20-0.9 MG/50ML-% IV SOLN
20.0000 mg | Freq: Once | INTRAVENOUS | Status: AC
  Administered 2015-02-22: 20 mg via INTRAVENOUS
  Filled 2015-02-21: qty 50

## 2015-02-21 MED ORDER — DIPHENHYDRAMINE HCL 50 MG/ML IJ SOLN
50.0000 mg | Freq: Once | INTRAMUSCULAR | Status: AC
  Administered 2015-02-22: 50 mg via INTRAVENOUS
  Filled 2015-02-21: qty 1

## 2015-02-21 MED ORDER — METHYLPREDNISOLONE SODIUM SUCC 125 MG IJ SOLR
125.0000 mg | Freq: Once | INTRAMUSCULAR | Status: AC
  Administered 2015-02-22: 125 mg via INTRAVENOUS
  Filled 2015-02-21: qty 2

## 2015-02-21 NOTE — ED Provider Notes (Signed)
CSN: [id number]     Arrival date & time 02/21/15  2203 History   First MD Initiated Contact with Patient 02/21/15 2346     Chief Complaint  Patient presents with  . Insect Bite     (Consider location/radiation/quality/duration/timing/severity/associated sxs/prior Treatment) HPI Martha Buck is a 54 y.o. female with history of depression, presents to emergency department complaining of facial swelling. Patient states she got stung by yellow jacket earlier today on the forehead. She states she gets done around 3:30 PM this afternoon. States she went home and took 50mg  of  Benadryl around 4pm, states went to bed for a nap. She states she woke up feeling like her face was swollen. She states that she looked in the mirror in her eyes and her nose was swollen and red. She denies any swelling to the lips, tongue, throat. She denies any difficulty breathing. She denies any fever or chills. She states that for head is tender, and her eyes are burning. She denies prior be a yellow jacket stinging. She is unsure if she is allergic. She denies any other complaints at this time.  Past Medical History  Diagnosis Date  . 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 Acute meniscal tear of knee     RIGHT  . Depression    Past Surgical History  Procedure Laterality Date  . Lumbar fusion  03-20-2010    L4 -- L5  . Ectopic pregnancy surgery  1991  . Knee arthroscopy with medial menisectomy Right 11/26/2012    Procedure: KNEE ARTHROSCOPY WITH PARTIAL MEDIAL MENISECTOMY,DEBRIDEMENT;  Surgeon: Javier DockerJeffrey C Beane, MD;  [facility];  Service: Orthopedics;  Laterality: Right;  . Back surgery     No family history on file. History  Substance Use Topics  . Smoking status: Current Every Day Smoker -- 0.00 packs/day for 34 years    Types: Cigarettes  . Smokeless tobacco: Never Used     Comment: smokes 3 cig daily  . Alcohol Use: Yes     Comment: occasonal   OB History    No data available     Review of Systems  Constitutional: Negative for fever and chills.  HENT: Positive for facial swelling.   Eyes: Positive for redness.  Respiratory: Negative for cough, chest tightness and shortness of breath.   Cardiovascular: Negative for chest pain, palpitations and leg swelling.  Gastrointestinal: Negative for nausea, vomiting, abdominal pain and diarrhea.  Musculoskeletal: Negative for myalgias, arthralgias, neck pain and neck stiffness.  Skin: Negative for rash.  Neurological: Negative for dizziness, weakness and headaches.  All other systems reviewed and are negative.     Allergies  Aspirin and Morphine and related  Home Medications   Prior to Admission medications   Medication Sig Start Date End Date Taking? Authorizing Provider  acetaminophen (TYLENOL) 500 MG tablet Take 500 mg by mouth every 6 (six) hours as needed for mild pain.   Yes Historical Provider, MD  amitriptyline (ELAVIL) 50 MG tablet Take 50 mg by mouth at bedtime as needed for sleep.   Yes Historical Provider, MD  gabapentin (NEURONTIN) 600 MG tablet Take 600 mg by mouth 2 (two) times daily.   Yes Historical Provider, MD   BP 109/79 mmHg  Pulse 70  Temp(Src) 98 F (36.7 C) (Oral)  Resp 14  Ht 5\' 8"  (1.727 m)  Wt 220 lb (99.791 kg)  BMI 33.46 kg/m2  SpO2 99%  LMP 04/26/2012 Physical Exam  Constitutional: She is oriented to person, place, and time. She appears well-developed and well-nourished. No distress.  HENT:  Head: Normocephalic.  Sting mark to the left forehead, no stinger seen. Swelling to the periorbital area, nose, cheeks. Lips, tongue, uvula normal.  Eyes: Conjunctivae are normal.  Bilateral periorbital swelling and erythmea  Neck: Normal range of motion. Neck supple.  Cardiovascular: Normal rate, regular rhythm and normal heart sounds.   Pulmonary/Chest: Effort normal and breath sounds normal. No respiratory distress. She has no wheezes. She has no rales.  No  stridor  Abdominal: Soft. Bowel sounds are normal. She exhibits no distension. There is no tenderness. There is no rebound.  Musculoskeletal: She exhibits no edema.  Neurological: She is alert and oriented to person, place, and time.  Skin: Skin is warm and dry.  Psychiatric: She has a normal mood and affect. Her behavior is normal.  Nursing note and vitals reviewed.   ED Course  Procedures (including critical care time) Labs Review Labs Reviewed - No data to display  Imaging Review No results found.   EKG Interpretation None      MDM   Final diagnoses:  Allergic reaction to bee sting, accidental or unintentional, initial encounter    Pt stung at 3:30 this afternoon, 8+ hours ago. Here with facial swelling. No swelling to the lips, tongue, throat. Non toxic appearing otherwise. Will start iv solumedrol, benadryl, pepcid. Will monitor.   1:27 AM There was some delay in receiving medications. Pt is now sleeping. No distress. Will need to obs for several hours. Signed out to PA Piepenbrink at shift change.   Filed Vitals:   02/21/15 2348 02/22/15 0000 02/22/15 0015 02/22/15 0030  BP: 109/79 106/72 126/97 115/83  Pulse: 70 68 73 71  Temp:      TempSrc:      Resp: Height:      Weight:      SpO2: 99% 97% 99% 98%     Jaynie Crumble, PA-C 02/23/15 0021  Cathren Laine, MD 02/26/15 [phone]

## 2015-02-21 NOTE — ED Notes (Signed)
Pt. stung by bees this afternoon , presents with swelling at face and nose , airway intact / respirations unlabored .

## 2015-02-22 DIAGNOSIS — T63441A Toxic effect of venom of bees, accidental (unintentional), initial encounter: Secondary | ICD-10-CM | POA: Diagnosis not present

## 2015-02-22 MED ORDER — PREDNISONE 20 MG PO TABS: 40.0000 mg | ORAL_TABLET | Freq: Every day | ORAL | Status: DC

## 2015-02-22 MED ORDER — EPINEPHRINE 0.3 MG/0.3ML IJ SOAJ: 0.3000 mg | Freq: Once | INTRAMUSCULAR | Status: DC | PRN

## 2015-02-22 MED ORDER — FAMOTIDINE 20 MG PO TABS: 20.0000 mg | ORAL_TABLET | Freq: Two times a day (BID) | ORAL | Status: DC

## 2015-02-22 MED ORDER — DIPHENHYDRAMINE HCL 25 MG PO TABS: 25.0000 mg | ORAL_TABLET | Freq: Four times a day (QID) | ORAL | Status: DC | PRN

## 2015-02-22 NOTE — Discharge Instructions (Signed)
Please follow up with your primary care physician in 1-2 days. If you do not have one please call the [facility] number listed above. Please read all discharge instructions and return precautions.    Anaphylactic Reaction An anaphylactic reaction is a sudden, severe allergic reaction that involves the whole body. It can be life threatening. A hospital stay is often required. People with asthma, eczema, or hay fever are slightly more likely to have an anaphylactic reaction. CAUSES  An anaphylactic reaction may be caused by anything to which you are allergic. After being exposed to the allergic substance, your immune system becomes sensitized to it. When you are exposed to that allergic substance again, an allergic reaction can occur. Common causes of an anaphylactic reaction include:  Medicines.  Foods, especially peanuts, wheat, shellfish, milk, and eggs.  Insect bites or stings.  Blood products.  Chemicals, such as dyes, latex, and contrast material used for imaging tests. SYMPTOMS  When an allergic reaction occurs, the body releases histamine and other substances. These substances cause symptoms such as tightening of the airway. Symptoms often develop within seconds or minutes of exposure. Symptoms may include:  Skin rash or hives.  Itching.  Chest tightness.  Swelling of the eyes, tongue, or lips.  Trouble breathing or swallowing.  Lightheadedness or fainting.  Anxiety or confusion.  Stomach pains, vomiting, or diarrhea.  Nasal congestion.  A fast or irregular heartbeat (palpitations). DIAGNOSIS  Diagnosis is based on your history of recent exposure to allergic substances, your symptoms, and a physical exam. Your caregiver may also perform blood or urine tests to confirm the diagnosis. TREATMENT  Epinephrine medicine is the main treatment for an anaphylactic reaction. Other medicines that may be used for treatment include antihistamines, steroids, and  albuterol. In severe cases, fluids and medicine to support blood pressure may be given through an intravenous line (IV). Even if you improve after treatment, you need to be observed to make sure your condition does not get worse. This may require a stay in the hospital. Pleasants a medical alert bracelet or necklace stating your allergy.  You and your family must learn how to use an anaphylaxis kit or give an epinephrine injection to temporarily treat an emergency allergic reaction. Always carry your epinephrine injection or anaphylaxis kit with you. This can be lifesaving if you have a severe reaction.  Do not drive or perform tasks after treatment until the medicines used to treat your reaction have worn off, or until your caregiver says it is okay.  If you have hives or a rash:  Take medicines as directed by your caregiver.  You may use an over-the-counter antihistamine (diphenhydramine) as needed.  Apply cold compresses to the skin or take baths in cool water. Avoid hot baths or showers. SEEK MEDICAL CARE IF:   You develop symptoms of an allergic reaction to a new substance. Symptoms may start right away or minutes later.  You develop a rash, hives, or itching.  You develop new symptoms. SEEK IMMEDIATE MEDICAL CARE IF:   You have swelling of the mouth, difficulty breathing, or wheezing.  You have a tight feeling in your chest or throat.  You develop hives, swelling, or itching all over your body.  You develop severe vomiting or diarrhea.  You feel faint or pass out. This is an emergency. Use your epinephrine injection or anaphylaxis kit as you have been instructed. Call your local emergency services (911 in [place] Even  if you improve after the injection, you need to be examined at a hospital emergency department. MAKE SURE YOU:   Understand these instructions.  Will watch your condition.  Will get help right away if you are not doing well or get  worse. Document Released: 08/04/2005 Document Revised: 08/09/2013 Document Reviewed: 11/05/2011 [facility] Patient Information 2015 [place], [place]. This information is not intended to replace advice given to you by your health care provider. Make sure you discuss any questions you have with your health care provider.

## 2015-02-22 NOTE — ED Provider Notes (Signed)
Patient care acquired from [facility] Kir[facility], PA-C  Results for orders placed or performed during the hospital encounter of 10/13/14  I-Stat Chem 8, ED  Result Value Ref Range   Sodium 142 135 - 145 mmol/L   Potassium 3.6 3.5 - 5.1 mmol/L   Chloride 106 96 - 112 mmol/L   BUN 26 (H) 6 - 23 mg/dL   Creatinine, Ser 8.290.80 0.50 - 1.10 mg/dL   Glucose, Bld 81 70 - 99 mg/dL   Calcium, Ion 5.621.17 1.301.12 - 1.23 mmol/L   TCO2 21 0 - 100 mmol/L   Hemoglobin 14.3 12.0 - 15.0 g/dL   HCT 86.542.0 78.436.0 - 69.646.0 %  POC Urine Pregnancy, ED (do NOT order at [facility])  Result Value Ref Range   Preg Test, Ur NEGATIVE NEGATIVE   No results found.  Medications  methylPREDNISolone sodium succinate (SOLU-MEDROL) 125 mg/2 mL injection 125 mg (125 mg Intravenous Given 02/22/15 0040)  diphenhydrAMINE (BENADRYL) injection 50 mg (50 mg Intravenous Given 02/22/15 0040)  famotidine (PEPCID) IVPB 20 mg premix (20 mg Intravenous New Bag/Given 02/22/15 0041)   1. Allergic reaction to bee sting, accidental or unintentional, initial encounter    Patient re-evaluated prior to dc, is hemodynamically stable, in no respiratory distress, and denies the feeling of throat closing. Pt has been advised to take OTC benadryl & return to the ED if they have a mod-severe allergic rxn (s/s including throat closing, difficulty breathing, swelling of lips face or tongue). Pt is to follow up with their PCP. Pt is agreeable with plan & verbalizes understanding.Patient is stable at time of discharge   Francee PiccoloJennifer Jeremy Mclamb, PA-C 02/22/15 [id number]  Marisa Severinlga Otter, MD 02/22/15 [phone]

## 2015-02-22 NOTE — ED Notes (Signed)
Pt stable, ambulatory, states understanding of discharge instructions 

## 2015-02-28 ENCOUNTER — Encounter (HOSPITAL_COMMUNITY): Payer: Self-pay | Admitting: Emergency Medicine

## 2015-02-28 ENCOUNTER — Emergency Department (HOSPITAL_COMMUNITY)
Admission: EM | Admit: 2015-02-28 | Discharge: 2015-02-28 | Disposition: A | Discharge status: Home / Self Care | Payer: Medicare Other | Attending: Emergency Medicine | Admitting: Emergency Medicine

## 2015-02-28 DIAGNOSIS — Y998 Other external cause status: Secondary | ICD-10-CM | POA: Diagnosis not present

## 2015-02-28 DIAGNOSIS — X58XXXA Exposure to other specified factors, initial encounter: Secondary | ICD-10-CM | POA: Insufficient documentation

## 2015-02-28 DIAGNOSIS — S0502XA Injury of conjunctiva and corneal abrasion without foreign body, left eye, initial encounter: Secondary | ICD-10-CM | POA: Diagnosis not present

## 2015-02-28 DIAGNOSIS — Y9289 Other specified places as the place of occurrence of the external cause: Secondary | ICD-10-CM | POA: Diagnosis not present

## 2015-02-28 DIAGNOSIS — H5712 Ocular pain, left eye: Secondary | ICD-10-CM | POA: Insufficient documentation

## 2015-02-28 DIAGNOSIS — Z8739 Personal history of other diseases of the musculoskeletal system and connective tissue: Secondary | ICD-10-CM | POA: Diagnosis not present

## 2015-02-28 DIAGNOSIS — F329 Major depressive disorder, single episode, unspecified: Secondary | ICD-10-CM | POA: Diagnosis not present

## 2015-02-28 DIAGNOSIS — Y9389 Activity, other specified: Secondary | ICD-10-CM | POA: Insufficient documentation

## 2015-02-28 DIAGNOSIS — Z720 Tobacco use: Secondary | ICD-10-CM | POA: Insufficient documentation

## 2015-02-28 DIAGNOSIS — Z87828 Personal history of other (healed) physical injury and trauma: Secondary | ICD-10-CM | POA: Insufficient documentation

## 2015-02-28 DIAGNOSIS — Z79899 Other long term (current) drug therapy: Secondary | ICD-10-CM | POA: Insufficient documentation

## 2015-02-28 DIAGNOSIS — S0592XA Unspecified injury of left eye and orbit, initial encounter: Secondary | ICD-10-CM | POA: Diagnosis present

## 2015-02-28 DIAGNOSIS — H53149 Visual discomfort, unspecified: Secondary | ICD-10-CM | POA: Insufficient documentation

## 2015-02-28 DIAGNOSIS — Z7952 Long term (current) use of systemic steroids: Secondary | ICD-10-CM | POA: Insufficient documentation

## 2015-02-28 DIAGNOSIS — H539 Unspecified visual disturbance: Secondary | ICD-10-CM | POA: Insufficient documentation

## 2015-02-28 DIAGNOSIS — Z8659 Personal history of other mental and behavioral disorders: Secondary | ICD-10-CM | POA: Diagnosis not present

## 2015-02-28 MED ORDER — KETOROLAC TROMETHAMINE 0.5 % OP SOLN
1.0000 [drp] | Freq: Four times a day (QID) | OPHTHALMIC | Status: DC
  Administered 2015-02-28: 1 [drp] via OPHTHALMIC
  Filled 2015-02-28: qty 3

## 2015-02-28 MED ORDER — ERYTHROMYCIN 5 MG/GM OP OINT: 1.0000 "application " | TOPICAL_OINTMENT | Freq: Four times a day (QID) | OPHTHALMIC | Status: DC

## 2015-02-28 MED ORDER — FLUORESCEIN SODIUM 1 MG OP STRP
1.0000 | ORAL_STRIP | Freq: Once | OPHTHALMIC | Status: DC
  Filled 2015-02-28: qty 1

## 2015-02-28 MED ORDER — PROPARACAINE HCL 0.5 % OP SOLN
1.0000 [drp] | Freq: Once | OPHTHALMIC | Status: DC
  Filled 2015-02-28: qty 15

## 2015-02-28 NOTE — Discharge Instructions (Signed)
Please take your medications as directed. Follow-up with your doctor as needed. Return to ED for worsening symptoms.  Corneal Abrasion The cornea is the clear covering at the front and center of the eye. When looking at the colored portion of the eye (iris), you are looking through the cornea. This very thin tissue is made up of many layers. The surface layer is a single layer of cells (corneal epithelium) and is one of the most sensitive tissues in the body. If a scratch or injury causes the corneal epithelium to come off, it is called a corneal abrasion. If the injury extends to the tissues below the epithelium, the condition is called a corneal ulcer. CAUSES   Scratches.  Trauma.  Foreign body in the eye. Some people have recurrences of abrasions in the area of the original injury even after it has healed (recurrent erosion syndrome). Recurrent erosion syndrome generally improves and goes away with time. SYMPTOMS   Eye pain.  Difficulty or inability to keep the injured eye open.  The eye becomes very sensitive to light.  Recurrent erosions tend to happen suddenly, first thing in the morning, usually after waking up and opening the eye. DIAGNOSIS  Your health care provider can diagnose a corneal abrasion during an eye exam. Dye is usually placed in the eye using a drop or a small paper strip moistened by your tears. When the eye is examined with a special light, the abrasion shows up clearly because of the dye. TREATMENT   Small abrasions may be treated with antibiotic drops or ointment alone.  A pressure patch may be put over the eye. If this is done, follow your doctor's instructions for when to remove the patch. Do not drive or use machines while the eye patch is on. Judging distances is hard to do with a patch on. If the abrasion becomes infected and spreads to the deeper tissues of the cornea, a corneal ulcer can result. This is serious because it can cause corneal scarring.  Corneal scars interfere with light passing through the cornea and cause a loss of vision in the involved eye. HOME CARE INSTRUCTIONS  Use medicine or ointment as directed. Only take over-the-counter or prescription medicines for pain, discomfort, or fever as directed by your health care provider.  Do not drive or operate machinery if your eye is patched. Your ability to judge distances is impaired.  If your health care provider has given you a follow-up appointment, it is very important to keep that appointment. Not keeping the appointment could result in a severe eye infection or permanent loss of vision. If there is any problem keeping the appointment, let your health care provider know. SEEK MEDICAL CARE IF:   You have pain, light sensitivity, and a scratchy feeling in one eye or both eyes.  Your pressure patch keeps loosening up, and you can blink your eye under the patch after treatment.  Any kind of discharge develops from the eye after treatment or if the lids stick together in the morning.  You have the same symptoms in the morning as you did with the original abrasion days, weeks, or months after the abrasion healed. MAKE SURE YOU:   Understand these instructions.  Will watch your condition.  Will get help right away if you are not doing well or get worse. Document Released: 08/01/2000 Document Revised: 08/09/2013 Document Reviewed: 04/11/2013 [facility] Patient Information 2015 [place]. This information is not intended to replace advice given to you by your  health care provider. Make sure you discuss any questions you have with your health care provider. ° °

## 2015-02-28 NOTE — Discharge Instructions (Signed)
1. Medications: Ketoralac, erythromycin, usual home medications 2. Treatment: rest, drink plenty of fluids,  3. Follow Up: Please followup with the ophthalmologist within 24 hours

## 2015-02-28 NOTE — ED Notes (Signed)
Pt states she was seen in ED 1 week ago after yellow jacket stings to L side of face.  C/o continued L eye pain and blurred vision to L eye since being stung.

## 2015-02-28 NOTE — ED Notes (Signed)
Pt seen here earlier for same, corneal abrasion. Pt states she was driving home and had to pull over because pain came back and she could not see.

## 2015-02-28 NOTE — ED Provider Notes (Signed)
CSN: [id number]     Arrival date & time 02/28/15  2038 History   This chart was scribed for non-physician practitioner, Dierdre ForthHannah Maliik Karner PA-C working with Bethann BerkshireJoseph Zammit, MD by Arlan OrganAshley Leger, ED Scribe. This patient was seen in room TR04C/TR04C and the patient's care was started at 9:29 PM.   Chief Complaint  Patient presents with  . Eye Problem   The history is provided by the patient. No language interpreter was used.    HPI Comments: Martha Buck is a 54 y.o. female who presents to the Emergency Department complaining of constant, ongoing L eye pain with associated mild burred vision and photophobia  x 1 week. Pt was seen approximately 2 hours ago in department for same and was diagnosed with a corneal abrasion to the eye. Prescription antibiotic drops given prior to discharge. Pt states she was driving home but pulled over due to return of pain. In addition, pt was seen 7 day prior after a yellow jacket sting to the L side of the face. No recent fever or chills. Pt with known allergies to ASA and Morphine.  Past Medical History  Diagnosis Date  . 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 Acute meniscal tear of knee     RIGHT  . Depression    Past Surgical History  Procedure Laterality Date  . Lumbar fusion  03-20-2010    L4 -- L5  . Ectopic pregnancy surgery  1991  . Knee arthroscopy with medial menisectomy Right 11/26/2012    Procedure: KNEE ARTHROSCOPY WITH PARTIAL MEDIAL MENISECTOMY,DEBRIDEMENT;  Surgeon: Javier DockerJeffrey C Beane, MD;  [facility];  Service: Orthopedics;  Laterality: Right;  . Back surgery     History reviewed. No pertinent family history. History  Substance Use Topics  . Smoking status: Current Every Day Smoker -- 0.00 packs/day for 34 years    Types: Cigarettes  . Smokeless tobacco: Never Used     Comment: smokes 3 cig daily  . Alcohol Use: Yes     Comment: occasonal   OB History    No data available     Review of  Systems  Constitutional: Negative for fever, chills, diaphoresis, appetite change, fatigue and unexpected weight change.  HENT: Negative for mouth sores.   Eyes: Positive for photophobia, pain, redness and visual disturbance.  Respiratory: Negative for cough, chest tightness, shortness of breath and wheezing.   Cardiovascular: Negative for chest pain.  Gastrointestinal: Negative for nausea, vomiting, abdominal pain, diarrhea and constipation.  Endocrine: Negative for polydipsia, polyphagia and polyuria.  Genitourinary: Negative for dysuria, urgency, frequency and hematuria.  Musculoskeletal: Negative for back pain and neck stiffness.  Skin: Negative for rash.  Allergic/Immunologic: Negative for immunocompromised state.  Neurological: Negative for syncope, light-headedness and headaches.  Hematological: Does not bruise/bleed easily.  Psychiatric/Behavioral: Negative for confusion and sleep disturbance. The patient is not nervous/anxious.       Allergies  Aspirin and Morphine and related  Home Medications   Prior to Admission medications   Medication Sig Start Date End Date Taking? Authorizing Provider  acetaminophen (TYLENOL) 500 MG tablet Take 500 mg by mouth every 6 (six) hours as needed for mild pain.    Historical Provider, MD  amitriptyline (ELAVIL) 50 MG tablet Take 50 mg by mouth at bedtime as needed for sleep.    Historical Provider, MD  diphenhydrAMINE (BENADRYL) 25 MG tablet Take 1 tablet (25 mg total) by mouth every 6 (six) hours as needed  for itching (Rash). 02/22/15   Jennifer Piepenbrink, PA-C  EPINEPHrine (EPIPEN 2-PAK) 0.3 mg/0.3 mL IJ SOAJ injection Inject 0.3 mLs (0.3 mg total) into the muscle once as needed (for severe allergic reaction). CAll 911 immediately if you have to use this medicine 02/22/15   Francee Piccolo, PA-C  erythromycin ophthalmic ointment Place 1 application into the left eye every 6 (six) hours. Place 1/2 inch ribbon of ointment in the affected eye  4 times a day 02/28/15   Joycie Peek, PA-C  famotidine (PEPCID) 20 MG tablet Take 1 tablet (20 mg total) by mouth 2 (two) times daily. 02/22/15   Jennifer Piepenbrink, PA-C  gabapentin (NEURONTIN) 600 MG tablet Take 600 mg by mouth 2 (two) times daily.    Historical Provider, MD  predniSONE (DELTASONE) 20 MG tablet Take 2 tablets (40 mg total) by mouth daily. 02/22/15   Francee Piccolo, PA-C   Triage Vitals: BP 138/91 mmHg  Pulse 67  Temp(Src) 98.2 F (36.8 C) (Oral)  Resp 20  SpO2 100%  LMP 04/26/2012   Physical Exam  Constitutional: She is oriented to person, place, and time. She appears well-developed and well-nourished. No distress.  HENT:  Head: Normocephalic and atraumatic.  Nose: Nose normal. No mucosal edema or rhinorrhea.  Mouth/Throat: Uvula is midline, oropharynx is clear and moist and mucous membranes are normal. No uvula swelling. No oropharyngeal exudate, posterior oropharyngeal edema, posterior oropharyngeal erythema or tonsillar abscesses.  Eyes: Conjunctivae, EOM and lids are normal. Pupils are equal, round, and reactive to light. Lids are everted and swept, no foreign bodies found. Right eye exhibits no chemosis, no discharge and no exudate. No foreign body present in the right eye. Left eye exhibits no chemosis, no discharge and no exudate. No foreign body present in the left eye. Right conjunctiva is not injected. Right conjunctiva has no hemorrhage. Left conjunctiva is not injected. Left conjunctiva has no hemorrhage.  Slit lamp exam:      The right eye shows no corneal abrasion, no corneal flare, no corneal ulcer, no foreign body, no fluorescein uptake and no anterior chamber bulge.       The left eye shows no corneal abrasion, no corneal flare, no corneal ulcer, no foreign body, no fluorescein uptake and no anterior chamber bulge.  Pupils equal round and reactive to light No vertical, horizontal or rotational nystagmus Small Corneal abrasion noted to the left eye  at the 4 o'clock position No visible foreign body No corneal flare, ulcer or dendritic staining  No herpetic lesions to the face or around the eye Direct photophobia but no consensual photophobia  Neck: Normal range of motion.  Full range of motion without pain No midline or paraspinal tenderness No nuchal rigidity; no meningeal signs  Cardiovascular: Normal rate, regular rhythm and intact distal pulses.   Pulmonary/Chest: Effort normal. No respiratory distress.  Musculoskeletal: Normal range of motion.  Neurological: She is alert and oriented to person, place, and time.  Mental Status:  Alert, oriented, thought content appropriate. Speech fluent without evidence of aphasia. Able to follow 2 step commands without difficulty.   Cranial Nerves:  II:  Peripheral visual fields grossly normal, pupils equal, round, reactive to light III,IV, VI: ptosis not present, extra-ocular motions intact bilaterally  V,VII: smile symmetric, facial light touch sensation equal VIII: hearing grossly normal bilaterally  IX,X: gag reflex present  XI: bilateral shoulder shrug equal and strong XII: midline tongue extension   Skin: Skin is warm and dry. She is not diaphoretic. No  erythema.  Psychiatric: She has a normal mood and affect.  Nursing note and vitals reviewed.   ED Course  Procedures (including critical care time)  DIAGNOSTIC STUDIES: Oxygen Saturation is 100% on room, normal by my interpretation.    COORDINATION OF CARE: 9:37 PM- Discussed treatment plan with pt at bedside and pt agreed to plan.     Labs Review Labs Reviewed - No data to display  Imaging Review No results found.   EKG Interpretation None      MDM   Final diagnoses:  Left eye pain   Martha Buck presents back to the ED several hours after leaving when the pain in her left eye returned.  Patient was evaluated earlier for the same symptoms. At that time she had a normal tono exam. She had a small corneal  abrasion but no dendritic staining. Reassessment with the slitlamp shows persistent corneal abrasion. Patient given ketorolac drops here in the emergency department with improvement in pain. Recommend that she fill her prescription for erythromycin as prescribed earlier today. Also recommend the patient follow up with ophthalmology within 24 hours for further evaluation. Patient has several small papules across her forehead nose and cheeks but no vesicles. No lesion to the tip of her nose. No evidence of shingles.  No dendritic lesions in her eyes.    BP 138/91 mmHg  Pulse 67  Temp(Src) 98.2 F (36.8 C) (Oral)  Resp 20  SpO2 100%  LMP 04/26/2012  I personally performed the services described in this documentation, which was scribed in my presence. The recorded information has been reviewed and is accurate.   Dahlia Client Iren Whipp, PA-C 02/28/15 1610  Bethann Berkshire, MD 02/28/15 [phone]

## 2015-02-28 NOTE — ED Provider Notes (Signed)
CSN: [id number]     Arrival date & time 02/28/15  1624 History   This chart was scribed for non-physician practitioner Joycie PeekBenjamin Kamera Dubas, PA-C working with Gilda Creasehristopher J Pollina, MD by Lyndel SafeKaitlyn Shelton, ED Scribe. This patient was seen in room TR04C/TR04C and the patient's care was started at 5:36 PM.    Chief Complaint  Patient presents with  . Blurred Vision  . Eye Pain   The history is provided by the patient. No language interpreter was used.   HPI Comments: Martha Buck is a 54 y.o. female who presents to the Emergency Department complaining of constant, persistent, burning left eye pain with associated blurred vision and photophobia onset 7 days ago. Pt was seen in the ED 7 days ago after sustaining a yellow jacket sting to the left side of her face. She states she has been compliant with the course of treatment and the pain subsided briefly but has since worsened. Pt drove herself to the ED today. Denies headaches, fevers, or any other pertinent complaints. Additionally denies wearing correctional lenses.  Past Medical History  Diagnosis Date  . 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 Acute meniscal tear of knee     RIGHT  . Depression    Past Surgical History  Procedure Laterality Date  . Lumbar fusion  03-20-2010    L4 -- L5  . Ectopic pregnancy surgery  1991  . Knee arthroscopy with medial menisectomy Right 11/26/2012    Procedure: KNEE ARTHROSCOPY WITH PARTIAL MEDIAL MENISECTOMY,DEBRIDEMENT;  Surgeon: Javier DockerJeffrey C Beane, MD;  [facility] [place];  Service: Orthopedics;  Laterality: Right;  . Back surgery     No family history on file. History  Substance Use Topics  . Smoking status: Current Every Day Smoker -- 0.00 packs/day for 34 years    Types: Cigarettes  . Smokeless tobacco: Never Used     Comment: smokes 3 cig daily  . Alcohol Use: Yes     Comment: occasonal   OB History    No data available     Review of Systems   Constitutional: Negative for fever.  Eyes: Positive for photophobia, pain and visual disturbance.  Neurological: Negative for headaches.  All other systems reviewed and are negative.     Allergies  Aspirin and Morphine and related  Home Medications   Prior to Admission medications   Medication Sig Start Date End Date Taking? Authorizing Provider  acetaminophen (TYLENOL) 500 MG tablet Take 500 mg by mouth every 6 (six) hours as needed for mild pain.    Historical Provider, MD  amitriptyline (ELAVIL) 50 MG tablet Take 50 mg by mouth at bedtime as needed for sleep.    Historical Provider, MD  diphenhydrAMINE (BENADRYL) 25 MG tablet Take 1 tablet (25 mg total) by mouth every 6 (six) hours as needed for itching (Rash). 02/22/15   Jennifer Piepenbrink, PA-C  EPINEPHrine (EPIPEN 2-PAK) 0.3 mg/0.3 mL IJ SOAJ injection Inject 0.3 mLs (0.3 mg total) into the muscle once as needed (for severe allergic reaction). CAll 911 immediately if you have to use this medicine 02/22/15   Francee PiccoloJennifer Piepenbrink, PA-C  erythromycin ophthalmic ointment Place 1 application into the left eye every 6 (six) hours. Place 1/2 inch ribbon of ointment in the affected eye 4 times a day 02/28/15   Joycie PeekBenjamin Doralene Glanz, PA-C  famotidine (PEPCID) 20 MG tablet Take 1 tablet (20 mg total) by mouth 2 (two) times daily. 02/22/15   Francee PiccoloJennifer Piepenbrink,  PA-C  gabapentin (NEURONTIN) 600 MG tablet Take 600 mg by mouth 2 (two) times daily.    Historical Provider, MD  predniSONE (DELTASONE) 20 MG tablet Take 2 tablets (40 mg total) by mouth daily. 02/22/15   Jennifer Piepenbrink, PA-C   BP 133/94 mmHg  Pulse 78  Temp(Src) 97.2 F (36.2 C) (Oral)  Resp 18  SpO2 96%  LMP 04/26/2012 Physical Exam  Constitutional: She is oriented to person, place, and time. She appears well-developed and well-nourished. No distress.  HENT:  Head: Normocephalic.  Right Ear: External ear normal.  Left Ear: External ear normal.  Mouth/Throat: No oropharyngeal  exudate.  Eyes: Conjunctivae and EOM are normal. Pupils are equal, round, and reactive to light. Right eye exhibits no discharge. No scleral icterus.  Extraction movements intact without nystagmus or discomfort. No conjunctival hemorrhage. No other obvious lesions or foreign bodies noted.  Evaluation with Tono-Pen area intraocular pressure: L 22, 19, 20   R 19, 19, 21 Wasn't evaluation shows mild corneal abrasion in the left eye. No dendritic lesions, Seidel sign  Neck: No JVD present.  Cardiovascular: Normal rate, regular rhythm and normal heart sounds.   Pulmonary/Chest: Effort normal and breath sounds normal. No respiratory distress.  Musculoskeletal: She exhibits no edema.  Lymphadenopathy:    She has no cervical adenopathy.  Neurological: She is alert and oriented to person, place, and time.  Skin: Skin is warm. No rash noted. No erythema. No pallor.  Psychiatric: She has a normal mood and affect. Her behavior is normal.  Nursing note and vitals reviewed.  ED Course  Procedures  DIAGNOSTIC STUDIES: Oxygen Saturation is 96% on RA, normal by my interpretation.    COORDINATION OF CARE: 5:40 PM Discussed treatment plan which includes to exam left eye with pt. Pt acknowledges and agrees to plan.   Labs Review Labs Reviewed - No data to display  Imaging Review No results found.   EKG Interpretation None     Meds given in ED:  Medications - No data to display  Discharge Medication List as of 02/28/2015  6:52 PM    START taking these medications   Details  erythromycin ophthalmic ointment Place 1 application into the left eye every 6 (six) hours. Place 1/2 inch ribbon of ointment in the affected eye 4 times a day, Starting 02/28/2015, Until Discontinued, Print       Filed Vitals:   02/28/15 1708 02/28/15 1858  BP: 133/94 110/69  Pulse: 78 62  Temp: 97.2 F (36.2 C) 98.1 F (36.7 C)  TempSrc: Oral Oral  Resp: 18 18  SpO2: 96% 100%    MDM  Vitals stable - WNL  -afebrile Pt resting comfortably in ED. Physical exam as mentioned above and grossly not concerning.  DDX--no evidence of glaucoma, retinal detachment, vitreous hemorrhage or other acute ocular emergency. Treated for corneal abrasion with erythromycin ointment as well as eye patch. Encourage follow-up with ophthalmology.  I discussed all relevant lab findings and imaging results with pt and they verbalized understanding. Discussed f/u with PCP within 48 hrs and return precautions, pt very amenable to plan.  Final diagnoses:  Eye discomfort, left  Corneal abrasion, left, initial encounter   I personally performed the services described in this documentation, which was scribed in my presence. The recorded information has been reviewed and is accurate.   Joycie Peek, PA-C 03/01/15 0003  Gilda Crease, MD 03/01/15 [phone]

## 2015-03-06 ENCOUNTER — Emergency Department (HOSPITAL_COMMUNITY)
Admission: EM | Admit: 2015-03-06 | Discharge: 2015-03-06 | Disposition: A | Discharge status: Home / Self Care | Payer: Medicare Other | Attending: Emergency Medicine | Admitting: Emergency Medicine

## 2015-03-06 ENCOUNTER — Encounter (HOSPITAL_COMMUNITY): Payer: Self-pay | Admitting: *Deleted

## 2015-03-06 DIAGNOSIS — M62830 Muscle spasm of back: Secondary | ICD-10-CM | POA: Insufficient documentation

## 2015-03-06 DIAGNOSIS — M545 Low back pain: Secondary | ICD-10-CM

## 2015-03-06 DIAGNOSIS — Z7952 Long term (current) use of systemic steroids: Secondary | ICD-10-CM | POA: Insufficient documentation

## 2015-03-06 DIAGNOSIS — Z720 Tobacco use: Secondary | ICD-10-CM | POA: Diagnosis not present

## 2015-03-06 DIAGNOSIS — F329 Major depressive disorder, single episode, unspecified: Secondary | ICD-10-CM | POA: Diagnosis not present

## 2015-03-06 DIAGNOSIS — Z79899 Other long term (current) drug therapy: Secondary | ICD-10-CM | POA: Insufficient documentation

## 2015-03-06 MED ORDER — ACETAMINOPHEN 325 MG PO TABS
650.0000 mg | ORAL_TABLET | Freq: Once | ORAL | Status: AC
  Administered 2015-03-06: 650 mg via ORAL
  Filled 2015-03-06: qty 2

## 2015-03-06 MED ORDER — NAPROXEN 250 MG PO TABS: 250.0000 mg | ORAL_TABLET | Freq: Two times a day (BID) | ORAL | Status: DC

## 2015-03-06 MED ORDER — METHOCARBAMOL 500 MG PO TABS: 500.0000 mg | ORAL_TABLET | Freq: Two times a day (BID) | ORAL | Status: DC | PRN

## 2015-03-06 NOTE — ED Provider Notes (Signed)
CSN: [id number]     Arrival date & time 03/06/15  1510 History   This chart was scribed for Martha FarrierWilliam Laaibah Wartman, PA-C working with Elwin MochaBlair Walden, MD by Elveria Risingimelie Horne, ED Scribe. This patient was seen in room WTR5/WTR5 and the patient's care was started at 4:08 PM.   Chief Complaint  Patient presents with  . Back Pain   HPI HPI Comments: Martha Buck is a 54 y.o. female with PSHx of lumbar fusion L4-5 who presents to the Emergency Department complaining of acute on chronic lower back exacerbation since yesterday. She complains of 8/10 bilateral low back spasm that is worse with movement.  Patient attempted to visit her orthopedist, Dr. Maurice SmallIbazebo, today but he was booked. Patient denies recent fall, injury or trauma as causation of current flare, but states that she's been "driving all day." No specific treatment at home other than rest. Patient denies bladder/bowel incontinence, tingling, numbness, weakness, fever, or chills. Patient denies personal history of cancer.    Past Medical History  Diagnosis Date  . 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 Acute meniscal tear of knee     RIGHT  . Depression    Past Surgical History  Procedure Laterality Date  . Lumbar fusion  03-20-2010    L4 -- L5  . Ectopic pregnancy surgery  1991  . Knee arthroscopy with medial menisectomy Right 11/26/2012    Procedure: KNEE ARTHROSCOPY WITH PARTIAL MEDIAL MENISECTOMY,DEBRIDEMENT;  Surgeon: Javier DockerJeffrey C Beane, MD;  [facility] [place];  Service: Orthopedics;  Laterality: Right;  . Back surgery     No family history on file. History  Substance Use Topics  . Smoking status: Current Every Day Smoker -- 0.00 packs/day for 34 years    Types: Cigarettes  . Smokeless tobacco: Never Used     Comment: smokes 3 cig daily  . Alcohol Use: Yes     Comment: occasonal   OB History    No data available     Review of Systems  Constitutional: Negative for fever, chills and unexpected weight  change.  Gastrointestinal: Negative for nausea, vomiting, abdominal pain and diarrhea.  Genitourinary: Negative for dysuria, hematuria and difficulty urinating.  Musculoskeletal: Positive for back pain. Negative for gait problem, neck pain and neck stiffness.  Skin: Negative for rash.  Neurological: Negative for dizziness, weakness, light-headedness and numbness.    Allergies  Aspirin and Morphine and related  Home Medications   Prior to Admission medications   Medication Sig Start Date End Date Taking? Authorizing Provider  acetaminophen (TYLENOL) 500 MG tablet Take 500 mg by mouth every 6 (six) hours as needed for mild pain.    Historical Provider, MD  amitriptyline (ELAVIL) 50 MG tablet Take 50 mg by mouth at bedtime as needed for sleep.    Historical Provider, MD  diphenhydrAMINE (BENADRYL) 25 MG tablet Take 1 tablet (25 mg total) by mouth every 6 (six) hours as needed for itching (Rash). 02/22/15   Jennifer Piepenbrink, PA-C  EPINEPHrine (EPIPEN 2-PAK) 0.3 mg/0.3 mL IJ SOAJ injection Inject 0.3 mLs (0.3 mg total) into the muscle once as needed (for severe allergic reaction). CAll 911 immediately if you have to use this medicine 02/22/15   Francee PiccoloJennifer Piepenbrink, PA-C  erythromycin ophthalmic ointment Place 1 application into the left eye every 6 (six) hours. Place 1/2 inch ribbon of ointment in the affected eye 4 times a day 02/28/15   Joycie PeekBenjamin Cartner, PA-C  famotidine (PEPCID) 20 MG  tablet Take 1 tablet (20 mg total) by mouth 2 (two) times daily. 02/22/15   Jennifer Piepenbrink, PA-C  gabapentin (NEURONTIN) 600 MG tablet Take 600 mg by mouth 2 (two) times daily.    Historical Provider, MD  methocarbamol (ROBAXIN) 500 MG tablet Take 1 tablet (500 mg total) by mouth 2 (two) times daily as needed for muscle spasms. 03/06/15   Martha Farrier, PA-C  naproxen (NAPROSYN) 250 MG tablet Take 1 tablet (250 mg total) by mouth 2 (two) times daily with a meal. 03/06/15   Martha Farrier, PA-C  predniSONE  (DELTASONE) 20 MG tablet Take 2 tablets (40 mg total) by mouth daily. 02/22/15   Francee Piccolo, PA-C   Triage Vitals: BP 101/80 mmHg  Pulse 80  Temp(Src) 98 F (36.7 C) (Oral)  Resp 18  SpO2 95%  LMP 04/26/2012 Physical Exam  Constitutional: She is oriented to person, place, and time. She appears well-developed and well-nourished. No distress.  Nontoxic appearing.  HENT:  Head: Normocephalic and atraumatic.  Eyes: Right eye exhibits no discharge. Left eye exhibits no discharge.  Cardiovascular: Normal rate, regular rhythm, normal heart sounds and intact distal pulses.   Pulmonary/Chest: Effort normal. No respiratory distress. She has no wheezes. She has no rales.  Abdominal: Soft. There is no tenderness.  Musculoskeletal:  Bilateral upper lumbar paraspinous muscles are tender and feel to be in spasm. No midline back tenderness.  No back deformity, erythema or ecchymosis. atient's strength is 5 out of 5 in her bilateral lower extremities. The patient is able to ambulate without difficulty or assistance.  Neurological: She is alert and oriented to person, place, and time. She has normal reflexes. She displays normal reflexes. Coordination normal.  Patient able to ambulate without difficulty. sensation intact her bilateral lower extremities.  Skin: Skin is warm and dry. No rash noted. She is not diaphoretic. No erythema. No pallor.  Psychiatric: She has a normal mood and affect. Her behavior is normal.  Nursing note and vitals reviewed.   ED Course  Procedures (including critical care time)  COORDINATION OF CARE: 4:21 PM- Discussed treatment plan with patient at bedside and patient agreed to plan.   Labs Review Labs Reviewed - No data to display  Imaging Review No results found.   EKG Interpretation None      Filed Vitals:   03/06/15 1518  BP: 101/80  Pulse: 80  Temp: 98 F (36.7 C)  TempSrc: Oral  Resp: 18  SpO2: 95%     MDM   Meds given in  ED:  Medications  acetaminophen (TYLENOL) tablet 650 mg (not administered)    New Prescriptions   METHOCARBAMOL (ROBAXIN) 500 MG TABLET    Take 1 tablet (500 mg total) by mouth 2 (two) times daily as needed for muscle spasms.   NAPROXEN (NAPROSYN) 250 MG TABLET    Take 1 tablet (250 mg total) by mouth 2 (two) times daily with a meal.    Final diagnoses:  Back muscle spasm  Bilateral low back pain, with sciatica presence unspecified   Patient with bilateral low back spasm and back pain.  No neurological deficits and normal neuro exam.  Patient can walk but states is painful.  No loss of bowel or bladder control.  No concern for cauda equina.  No fever, night sweats, weight loss, h/o cancer, IVDU.  RICE protocol and pain medicine indicated and discussed with patient. I advised the patient to follow-up with their primary care provider this week. I advised the patient  to return to the emergency department with new or worsening symptoms or new concerns. The patient verbalized understanding and agreement with plan.    I personally performed the services described in this documentation, which was scribed in my presence. The recorded information has been reviewed and is accurate.      Martha Farrier, PA-C 03/06/15 1626  Elwin Mocha, MD 03/06/15 2350

## 2015-03-06 NOTE — ED Notes (Signed)
Pt complains of pain in her back since yesterday. Pt states she has a history of back pain/surgies. Pt states "my back locks up sometimes".

## 2015-03-06 NOTE — Discharge Instructions (Signed)

## 2015-03-19 ENCOUNTER — Other Ambulatory Visit: Payer: Self-pay | Admitting: Specialist

## 2015-03-19 DIAGNOSIS — Z1231 Encounter for screening mammogram for malignant neoplasm of breast: Secondary | ICD-10-CM

## 2015-03-27 ENCOUNTER — Ambulatory Visit
Admission: RE | Admit: 2015-03-27 | Discharge: 2015-03-27 | Disposition: A | Discharge status: Home / Self Care | Payer: Medicare Other | Source: Ambulatory Visit | Attending: Specialist | Admitting: Specialist

## 2015-03-27 DIAGNOSIS — Z1231 Encounter for screening mammogram for malignant neoplasm of breast: Secondary | ICD-10-CM

## 2015-04-07 ENCOUNTER — Emergency Department (HOSPITAL_COMMUNITY)
Admission: EM | Admit: 2015-04-07 | Discharge: 2015-04-07 | Disposition: A | Discharge status: Home / Self Care | Payer: Medicare Other | Attending: Emergency Medicine | Admitting: Emergency Medicine

## 2015-04-07 ENCOUNTER — Encounter (HOSPITAL_COMMUNITY): Payer: Self-pay | Admitting: Emergency Medicine

## 2015-04-07 DIAGNOSIS — F329 Major depressive disorder, single episode, unspecified: Secondary | ICD-10-CM | POA: Diagnosis not present

## 2015-04-07 DIAGNOSIS — Z720 Tobacco use: Secondary | ICD-10-CM | POA: Insufficient documentation

## 2015-04-07 DIAGNOSIS — Z791 Long term (current) use of non-steroidal anti-inflammatories (NSAID): Secondary | ICD-10-CM | POA: Diagnosis not present

## 2015-04-07 DIAGNOSIS — M1711 Unilateral primary osteoarthritis, right knee: Secondary | ICD-10-CM | POA: Insufficient documentation

## 2015-04-07 DIAGNOSIS — Z79899 Other long term (current) drug therapy: Secondary | ICD-10-CM | POA: Diagnosis not present

## 2015-04-07 DIAGNOSIS — L0291 Cutaneous abscess, unspecified: Secondary | ICD-10-CM

## 2015-04-07 DIAGNOSIS — L02412 Cutaneous abscess of left axilla: Secondary | ICD-10-CM | POA: Insufficient documentation

## 2015-04-07 DIAGNOSIS — Z87828 Personal history of other (healed) physical injury and trauma: Secondary | ICD-10-CM | POA: Insufficient documentation

## 2015-04-07 DIAGNOSIS — Z7952 Long term (current) use of systemic steroids: Secondary | ICD-10-CM | POA: Insufficient documentation

## 2015-04-07 DIAGNOSIS — L089 Local infection of the skin and subcutaneous tissue, unspecified: Secondary | ICD-10-CM | POA: Diagnosis present

## 2015-04-07 MED ORDER — DOXYCYCLINE HYCLATE 100 MG PO TABS
100.0000 mg | ORAL_TABLET | Freq: Once | ORAL | Status: AC
  Administered 2015-04-07: 100 mg via ORAL
  Filled 2015-04-07: qty 1

## 2015-04-07 MED ORDER — IBUPROFEN 200 MG PO TABS
600.0000 mg | ORAL_TABLET | Freq: Once | ORAL | Status: AC
  Administered 2015-04-07: 600 mg via ORAL
  Filled 2015-04-07: qty 3

## 2015-04-07 MED ORDER — IBUPROFEN 600 MG PO TABS: 600.0000 mg | ORAL_TABLET | Freq: Three times a day (TID) | ORAL | Status: DC

## 2015-04-07 MED ORDER — DOXYCYCLINE HYCLATE 100 MG PO TABS: 100.0000 mg | ORAL_TABLET | Freq: Two times a day (BID) | ORAL | Status: DC

## 2015-04-07 NOTE — Discharge Instructions (Signed)
Abscess An abscess (boil or furuncle) is an infected area on or under the skin. This area is filled with yellowish-white fluid (pus) and other material (debris). HOME CARE   Only take medicines as told by your doctor.  If you were given antibiotic medicine, take it as directed. Finish the medicine even if you start to feel better.  If gauze is used, follow your doctor's directions for changing the gauze.  To avoid spreading the infection:  Keep your abscess covered with a bandage.  Wash your hands well.  Do not share personal care items, towels, or whirlpools with others.  Avoid skin contact with others.  Keep your skin and clothes clean around the abscess.  Keep all doctor visits as told. GET HELP RIGHT AWAY IF:   You have more pain, puffiness (swelling), or redness in the wound site.  You have more fluid or blood coming from the wound site.  You have muscle aches, chills, or you feel sick.  You have a fever. MAKE SURE YOU:   Understand these instructions.  Will watch your condition.  Will get help right away if you are not doing well or get worse. Document Released: 01/21/2008 Document Revised: 02/03/2012 Document Reviewed: 10/17/2011 [facility] Patient Information 2015 [place], [place]. This information is not intended to replace advice given to you by your health care provider. Make sure you discuss any questions you have with your health care provider. Abdomen very early stages of an abscess.  You've been given anti-biotics to take on a regular basis.  Please apply a warm compress to the area 3-4 times a day for 5-10 minutes n 2 days if it has not totally resolved or if the area becomes large more painful and soft to the touch.  Please return for further treatment

## 2015-04-07 NOTE — ED Notes (Signed)
Pt from home c/po boil to left axilla that started today.

## 2015-04-07 NOTE — ED Provider Notes (Signed)
CSN: [id number]     Arrival date & time 04/07/15  1926 History  This chart was scribed for non-physician practitioner Earley Favor, NP, working with Lorre Nick, MD, by Tanda Rockers, ED Scribe. This patient was seen in room WTR5/WTR5 and the patient's care was started at 8:07 PM.  Chief Complaint  Patient presents with  . Recurrent Skin Infections   The history is provided by the patient. No language interpreter was used.     HPI Comments: Martha Buck is a 54 y.o. female who presents to the Emergency Department complaining of abscess to left axilla that she noticed today. Pt notes increased tenderness and pain to the area. She describes the pain as a pin pricking sensation. Pt has not taken any pain medications or applied ice to the area. She also complains of a rash between her breasts. Denies fever, chills, drainage, or any other associated symptoms.    Past Medical History  Diagnosis Date  . 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 Acute meniscal tear of knee     RIGHT  . Depression    Past Surgical History  Procedure Laterality Date  . Lumbar fusion  03-20-2010    L4 -- L5  . Ectopic pregnancy surgery  1991  . Knee arthroscopy with medial menisectomy Right 11/26/2012    Procedure: KNEE ARTHROSCOPY WITH PARTIAL MEDIAL MENISECTOMY,DEBRIDEMENT;  Surgeon: Javier Docker, MD;  Location: [facility] [place];  Service: Orthopedics;  Laterality: Right;  . Back surgery     No family history on file. Social History  Substance Use Topics  . Smoking status: Current Every Day Smoker -- 0.00 packs/day for 34 years    Types: Cigarettes  . Smokeless tobacco: Never Used     Comment: smokes 3 cig daily  . Alcohol Use: Yes     Comment: occasonal   OB History    No data available     Review of Systems  Constitutional: Negative for fever and chills.  Skin: Positive for rash.       Abscess to left axilla  All other systems reviewed and are  negative.  Allergies  Aspirin and Morphine and related  Home Medications   Prior to Admission medications   Medication Sig Start Date End Date Taking? Authorizing Provider  acetaminophen (TYLENOL) 500 MG tablet Take 500 mg by mouth every 6 (six) hours as needed for mild pain.    Historical Provider, MD  amitriptyline (ELAVIL) 50 MG tablet Take 50 mg by mouth at bedtime as needed for sleep.    Historical Provider, MD  diphenhydrAMINE (BENADRYL) 25 MG tablet Take 1 tablet (25 mg total) by mouth every 6 (six) hours as needed for itching (Rash). 02/22/15   Jennifer Piepenbrink, PA-C  doxycycline (VIBRA-TABS) 100 MG tablet Take 1 tablet (100 mg total) by mouth 2 (two) times daily. 04/07/15   Earley Favor, NP  EPINEPHrine (EPIPEN 2-PAK) 0.3 mg/0.3 mL IJ SOAJ injection Inject 0.3 mLs (0.3 mg total) into the muscle once as needed (for severe allergic reaction). CAll 911 immediately if you have to use this medicine 02/22/15   Francee Piccolo, PA-C  erythromycin ophthalmic ointment Place 1 application into the left eye every 6 (six) hours. Place 1/2 inch ribbon of ointment in the affected eye 4 times a day 02/28/15   Joycie Peek, PA-C  famotidine (PEPCID) 20 MG tablet Take 1 tablet (20 mg total) by mouth 2 (two) times daily. 02/22/15   Victorino Dike  Piepenbrink, PA-C  gabapentin (NEURONTIN) 600 MG tablet Take 600 mg by mouth 2 (two) times daily.    Historical Provider, MD  ibuprofen (ADVIL,MOTRIN) 600 MG tablet Take 1 tablet (600 mg total) by mouth 3 (three) times daily. 04/07/15   Earley Favor, NP  methocarbamol (ROBAXIN) 500 MG tablet Take 1 tablet (500 mg total) by mouth 2 (two) times daily as needed for muscle spasms. 03/06/15   Everlene Farrier, PA-C  naproxen (NAPROSYN) 250 MG tablet Take 1 tablet (250 mg total) by mouth 2 (two) times daily with a meal. 03/06/15   Everlene Farrier, PA-C  predniSONE (DELTASONE) 20 MG tablet Take 2 tablets (40 mg total) by mouth daily. 02/22/15   Francee Piccolo, PA-C    Triage Vitals: BP 135/71 mmHg  Pulse 66  Temp(Src) 97.8 F (36.6 C) (Oral)  Resp 16  SpO2 99%  LMP 04/26/2012   Physical Exam  Constitutional: She is oriented to person, place, and time. She appears well-developed and well-nourished.  HENT:  Head: Normocephalic and atraumatic.  Eyes: Pupils are equal, round, and reactive to light.  Neck: Normal range of motion.  Cardiovascular: Normal rate.   Pulmonary/Chest: Effort normal.  Abdominal: Soft.  Musculoskeletal: She exhibits no edema.  Neurological: She is alert and oriented to person, place, and time.  Skin: Skin is warm. No erythema.  Small, firm half centimeter by quarter centimeter oval area in left axilla that is slightly reddened at the apex not fluctuant at this time.  Tender to touch  Nursing note and vitals reviewed.   ED Course  Procedures (including critical care time)  DIAGNOSTIC STUDIES: Oxygen Saturation is 99% on RA, normal by my interpretation.    COORDINATION OF CARE: 8:10 PM-Discussed treatment plan which includes Rx antibiotics with pt at bedside and pt agreed to plan.   Labs Review Labs Reviewed - No data to display  Imaging Review No results found. I have personally reviewed and evaluated these images and lab results as part of my medical decision-making.   EKG Interpretation None    patient is a small developing abscess in the left axilla that is not to the point where it needs to be IND.  She'll be started on anti-biotics warm compresses and follow-up in 2-3 days if it develops into a fluctuant abscess  MDM   Final diagnoses:  Abscess    I personally performed the services described in this documentation, which was scribed in my presence. The recorded information has been reviewed and is accurate.     Earley Favor, NP 04/07/15 1610  Lorre Nick, MD 04/07/15 [phone]

## 2015-04-26 ENCOUNTER — Encounter (HOSPITAL_COMMUNITY): Payer: Self-pay | Admitting: *Deleted

## 2015-04-26 ENCOUNTER — Emergency Department (HOSPITAL_COMMUNITY)
Admission: EM | Admit: 2015-04-26 | Discharge: 2015-04-26 | Discharge status: Against Medical Advice | Payer: Medicare Other | Attending: Emergency Medicine | Admitting: Emergency Medicine

## 2015-04-26 DIAGNOSIS — Z720 Tobacco use: Secondary | ICD-10-CM | POA: Insufficient documentation

## 2015-04-26 DIAGNOSIS — G8918 Other acute postprocedural pain: Secondary | ICD-10-CM | POA: Insufficient documentation

## 2015-04-26 NOTE — ED Notes (Signed)
Pt states that she had a back stimulator placed yesterday at her doctors office. States that she has the remote on the highest setting and it is not working. States that it is only working on her legs. Pt is ambulatory in triage.

## 2015-05-06 ENCOUNTER — Encounter (HOSPITAL_COMMUNITY): Payer: Self-pay | Admitting: *Deleted

## 2015-05-06 ENCOUNTER — Emergency Department (INDEPENDENT_AMBULATORY_CARE_PROVIDER_SITE_OTHER)
Admission: EM | Admit: 2015-05-06 | Discharge: 2015-05-06 | Disposition: A | Discharge status: Home / Self Care | Payer: Medicare Other | Source: Home / Self Care | Attending: Emergency Medicine | Admitting: Emergency Medicine

## 2015-05-06 DIAGNOSIS — M25561 Pain in right knee: Secondary | ICD-10-CM | POA: Diagnosis not present

## 2015-05-06 MED ORDER — HYDROCODONE-ACETAMINOPHEN 5-325 MG PO TABS: 1.0000 | ORAL_TABLET | Freq: Four times a day (QID) | ORAL | Status: DC | PRN

## 2015-05-06 NOTE — ED Notes (Signed)
Pt    Reports        Pain  And  Swelling to  The  r  Knee     States it  Gave  Way  yest  And again   Today   She  Reports   Pain on  Weight  Bearing          She  Ambulated  To  Room  With a  Slow  Gait  She reports  Back pain as  Well

## 2015-05-06 NOTE — ED Provider Notes (Signed)
CSN: [id number]     Arrival date & time 05/06/15  1807 History   First MD Initiated Contact with Patient 05/06/15 1928     Chief Complaint  Patient presents with  . Knee Pain   (Consider location/radiation/quality/duration/timing/severity/associated sxs/prior Treatment) HPI  She is a 54 year old woman here for evaluation of right knee pain. She states that on Monday she had a stimulator placed for chronic back and knee pain. She states it worked well for the pain, but it had to be removed. Since it was removed, she reports worse pain in her knee and back. Her concern today is that her knee has been giving out on her since last night. She also reports swelling in the right knee. She denies any injury or trauma. She did go to church today, which involved more stairs and bending the knee than usual. She reports pain mostly in the anterior knee. She also reports what sounds like a spasm earlier today where she could not move her leg or big toe. This has since resolved. She walks with a walker at home.  Past Medical History  Diagnosis Date  . 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 Acute meniscal tear of knee     RIGHT  . Depression   . Back pain    Past Surgical History  Procedure Laterality Date  . Lumbar fusion  03-20-2010    L4 -- L5  . Ectopic pregnancy surgery  1991  . Knee arthroscopy with medial menisectomy Right 11/26/2012    Procedure: KNEE ARTHROSCOPY WITH PARTIAL MEDIAL MENISECTOMY,DEBRIDEMENT;  Surgeon: Javier Docker, MD;  Location: [facility];  Service: Orthopedics;  Laterality: Right;  . Back surgery     History reviewed. No pertinent family history. Social History  Substance Use Topics  . Smoking status: Current Every Day Smoker -- 0.00 packs/day for 34 years    Types: Cigarettes  . Smokeless tobacco: Never Used     Comment: smokes 3 cig daily  . Alcohol Use: Yes     Comment: occasonal   OB History    No data available     Review of  Systems As in history of present illness Allergies  Aspirin and Morphine and related  Home Medications   Prior to Admission medications   Medication Sig Start Date End Date Taking? Authorizing Provider  acetaminophen (TYLENOL) 500 MG tablet Take 500 mg by mouth every 6 (six) hours as needed for mild pain.    Historical Provider, MD  amitriptyline (ELAVIL) 50 MG tablet Take 50 mg by mouth at bedtime as needed for sleep.    Historical Provider, MD  diphenhydrAMINE (BENADRYL) 25 MG tablet Take 1 tablet (25 mg total) by mouth every 6 (six) hours as needed for itching (Rash). 02/22/15   Jennifer Piepenbrink, PA-C  doxycycline (VIBRA-TABS) 100 MG tablet Take 1 tablet (100 mg total) by mouth 2 (two) times daily. 04/07/15   Earley Favor, NP  EPINEPHrine (EPIPEN 2-PAK) 0.3 mg/0.3 mL IJ SOAJ injection Inject 0.3 mLs (0.3 mg total) into the muscle once as needed (for severe allergic reaction). CAll 911 immediately if you have to use this medicine 02/22/15   Francee Piccolo, PA-C  erythromycin ophthalmic ointment Place 1 application into the left eye every 6 (six) hours. Place 1/2 inch ribbon of ointment in the affected eye 4 times a day 02/28/15   Joycie Peek, PA-C  famotidine (PEPCID) 20 MG tablet Take 1 tablet (20 mg total) by  mouth 2 (two) times daily. 02/22/15   Jennifer Piepenbrink, PA-C  gabapentin (NEURONTIN) 600 MG tablet Take 600 mg by mouth 2 (two) times daily.    Historical Provider, MD  HYDROcodone-acetaminophen (NORCO) 5-325 MG per tablet Take 1 tablet by mouth every 6 (six) hours as needed for moderate pain. 05/06/15   Charm Rings, MD  ibuprofen (ADVIL,MOTRIN) 600 MG tablet Take 1 tablet (600 mg total) by mouth 3 (three) times daily. 04/07/15   Earley Favor, NP  methocarbamol (ROBAXIN) 500 MG tablet Take 1 tablet (500 mg total) by mouth 2 (two) times daily as needed for muscle spasms. 03/06/15   Everlene Farrier, PA-C  naproxen (NAPROSYN) 250 MG tablet Take 1 tablet (250 mg total) by mouth 2 (two)  times daily with a meal. 03/06/15   Everlene Farrier, PA-C  predniSONE (DELTASONE) 20 MG tablet Take 2 tablets (40 mg total) by mouth daily. 02/22/15   Francee Piccolo, PA-C   Meds Ordered and Administered this Visit  Medications - No data to display  BP 113/85 mmHg  Pulse 88  Temp(Src) 99.4 F (37.4 C) (Oral)  Resp 16  SpO2 98%  LMP 04/26/2012 No data found.   Physical Exam  Constitutional: She is oriented to person, place, and time. She appears well-developed and well-nourished. No distress.  Cardiovascular: Normal rate.   Pulmonary/Chest: Effort normal.  Musculoskeletal:  Right knee: There is some soft tissue swelling. No joint effusion appreciated. No joint laxity. She is tender over the patellar tendon and medial and lateral joint lines. Positive McMurray's.  Neurological: She is alert and oriented to person, place, and time.    ED Course  Procedures (including critical care time)  Labs Review Labs Reviewed - No data to display  Imaging Review No results found.    MDM   1. Right knee pain    Will place in knee immobilizer given that her knee is going out on her. Prescription for Norco given to use for pain. She will follow-up with her orthopedic doctor as soon as possible.    Charm Rings, MD 05/06/15 2007

## 2015-05-06 NOTE — Discharge Instructions (Signed)
I am concerned that you tore something in your knee. Please wear the knee immobilizer during the day. Remove it twice a day to gently bend and straighten your knee. Put ice on your knee as often as you can. Take Norco every 4-6 hours as needed for pain. Please follow-up with your orthopedic doctor as soon as possible.

## 2015-05-29 ENCOUNTER — Other Ambulatory Visit: Payer: Self-pay | Admitting: Neurosurgery

## 2015-05-29 DIAGNOSIS — M48061 Spinal stenosis, lumbar region without neurogenic claudication: Secondary | ICD-10-CM

## 2015-06-11 ENCOUNTER — Other Ambulatory Visit: Payer: Medicare Other

## 2015-06-13 ENCOUNTER — Other Ambulatory Visit: Payer: Medicare Other

## 2015-09-10 ENCOUNTER — Other Ambulatory Visit (HOSPITAL_COMMUNITY): Payer: Self-pay | Admitting: Neurosurgery

## 2015-09-13 ENCOUNTER — Encounter (HOSPITAL_COMMUNITY): Payer: Self-pay | Admitting: *Deleted

## 2015-09-13 ENCOUNTER — Inpatient Hospital Stay (HOSPITAL_COMMUNITY)
Admission: EM | Admit: 2015-09-13 | Discharge: 2015-09-16 | DRG: 194 | Disposition: A | Discharge status: Home / Self Care | Payer: Medicare Other | Attending: Internal Medicine | Admitting: Internal Medicine

## 2015-09-13 ENCOUNTER — Emergency Department (INDEPENDENT_AMBULATORY_CARE_PROVIDER_SITE_OTHER)
Admission: EM | Admit: 2015-09-13 | Discharge: 2015-09-13 | Disposition: A | Discharge status: Nursing Home (Includes ICF) | Payer: Medicare Other | Source: Home / Self Care | Attending: Family Medicine | Admitting: Family Medicine

## 2015-09-13 ENCOUNTER — Emergency Department (HOSPITAL_COMMUNITY): Payer: Medicare Other

## 2015-09-13 DIAGNOSIS — Z87891 Personal history of nicotine dependence: Secondary | ICD-10-CM

## 2015-09-13 DIAGNOSIS — J1100 Influenza due to unidentified influenza virus with unspecified type of pneumonia: Principal | ICD-10-CM | POA: Diagnosis present

## 2015-09-13 DIAGNOSIS — Z8249 Family history of ischemic heart disease and other diseases of the circulatory system: Secondary | ICD-10-CM | POA: Diagnosis not present

## 2015-09-13 DIAGNOSIS — R1011 Right upper quadrant pain: Secondary | ICD-10-CM

## 2015-09-13 DIAGNOSIS — K529 Noninfective gastroenteritis and colitis, unspecified: Secondary | ICD-10-CM

## 2015-09-13 DIAGNOSIS — J189 Pneumonia, unspecified organism: Secondary | ICD-10-CM | POA: Diagnosis present

## 2015-09-13 DIAGNOSIS — Z833 Family history of diabetes mellitus: Secondary | ICD-10-CM | POA: Diagnosis not present

## 2015-09-13 DIAGNOSIS — R05 Cough: Secondary | ICD-10-CM | POA: Diagnosis present

## 2015-09-13 DIAGNOSIS — K8020 Calculus of gallbladder without cholecystitis without obstruction: Secondary | ICD-10-CM | POA: Diagnosis present

## 2015-09-13 DIAGNOSIS — R059 Cough, unspecified: Secondary | ICD-10-CM

## 2015-09-13 DIAGNOSIS — R519 Headache, unspecified: Secondary | ICD-10-CM

## 2015-09-13 DIAGNOSIS — R112 Nausea with vomiting, unspecified: Secondary | ICD-10-CM | POA: Diagnosis not present

## 2015-09-13 DIAGNOSIS — R51 Headache: Secondary | ICD-10-CM

## 2015-09-13 DIAGNOSIS — N179 Acute kidney failure, unspecified: Secondary | ICD-10-CM | POA: Diagnosis present

## 2015-09-13 LAB — POCT I-STAT, CHEM 8
BUN: 18 mg/dL (ref 6–20)
CALCIUM ION: 1.04 mmol/L — AB (ref 1.12–1.23)
CREATININE: 1 mg/dL (ref 0.44–1.00)
Chloride: 104 mmol/L (ref 101–111)
Glucose, Bld: 116 mg/dL — ABNORMAL HIGH (ref 65–99)
HCT: 48 % — ABNORMAL HIGH (ref 36.0–46.0)
Hemoglobin: 16.3 g/dL — ABNORMAL HIGH (ref 12.0–15.0)
POTASSIUM: 3.8 mmol/L (ref 3.5–5.1)
Sodium: 140 mmol/L (ref 135–145)
TCO2: 23 mmol/L (ref 0–100)

## 2015-09-13 LAB — CBC WITH DIFFERENTIAL/PLATELET
BASOS ABS: 0 10*3/uL (ref 0.0–0.1)
Basophils Relative: 0 %
Eosinophils Absolute: 0 10*3/uL (ref 0.0–0.7)
Eosinophils Relative: 0 %
HEMATOCRIT: 40.7 % (ref 36.0–46.0)
Hemoglobin: 13.4 g/dL (ref 12.0–15.0)
LYMPHS PCT: 19 %
Lymphs Abs: 2.1 10*3/uL (ref 0.7–4.0)
MCH: 31.1 pg (ref 26.0–34.0)
MCHC: 32.9 g/dL (ref 30.0–36.0)
MCV: 94.4 fL (ref 78.0–100.0)
Monocytes Absolute: 0.8 10*3/uL (ref 0.1–1.0)
Monocytes Relative: 7 %
NEUTROS ABS: 8.3 10*3/uL — AB (ref 1.7–7.7)
Neutrophils Relative %: 74 %
Platelets: 222 10*3/uL (ref 150–400)
RBC: 4.31 MIL/uL (ref 3.87–5.11)
RDW: 14 % (ref 11.5–15.5)
WBC: 11.3 10*3/uL — AB (ref 4.0–10.5)

## 2015-09-13 LAB — COMPREHENSIVE METABOLIC PANEL
ALBUMIN: 3.1 g/dL — AB (ref 3.5–5.0)
ALT: 27 U/L (ref 14–54)
ANION GAP: 12 (ref 5–15)
AST: 39 U/L (ref 15–41)
Alkaline Phosphatase: 52 U/L (ref 38–126)
BILIRUBIN TOTAL: 0.4 mg/dL (ref 0.3–1.2)
BUN: 15 mg/dL (ref 6–20)
CO2: 21 mmol/L — ABNORMAL LOW (ref 22–32)
Calcium: 7.9 mg/dL — ABNORMAL LOW (ref 8.9–10.3)
Chloride: 106 mmol/L (ref 101–111)
Creatinine, Ser: 1.03 mg/dL — ABNORMAL HIGH (ref 0.44–1.00)
GFR calc Af Amer: >60 mL/min (ref >60)
Glucose, Bld: 108 mg/dL — ABNORMAL HIGH (ref 65–99)
POTASSIUM: 3.8 mmol/L (ref 3.5–5.1)
Sodium: 139 mmol/L (ref 135–145)
TOTAL PROTEIN: 6.1 g/dL — AB (ref 6.5–8.1)

## 2015-09-13 LAB — I-STAT CG4 LACTIC ACID, ED
Lactic Acid, Venous: 1.15 mmol/L (ref 0.5–2.0)
Lactic Acid, Venous: 1.04 mmol/L (ref 0.5–2.0)

## 2015-09-13 MED ORDER — ONDANSETRON HCL 4 MG/2ML IJ SOLN
4.0000 mg | Freq: Once | INTRAMUSCULAR | Status: AC
  Administered 2015-09-13: 4 mg via INTRAVENOUS

## 2015-09-13 MED ORDER — SODIUM CHLORIDE 0.9 % IV SOLN
8.0000 mg | Freq: Once | INTRAVENOUS | Status: AC
  Administered 2015-09-13: 8 mg via INTRAVENOUS
  Filled 2015-09-13: qty 4

## 2015-09-13 MED ORDER — ACETAMINOPHEN 325 MG PO TABS
650.0000 mg | ORAL_TABLET | Freq: Once | ORAL | Status: AC | PRN
Start: 2015-09-13 — End: 2015-09-13
  Administered 2015-09-13: 650 mg via ORAL

## 2015-09-13 MED ORDER — ONDANSETRON HCL 4 MG/2ML IJ SOLN
INTRAMUSCULAR | Status: AC
  Filled 2015-09-13: qty 2

## 2015-09-13 MED ORDER — ACETAMINOPHEN 325 MG PO TABS
ORAL_TABLET | ORAL | Status: AC
  Filled 2015-09-13: qty 2

## 2015-09-13 MED ORDER — SODIUM CHLORIDE 0.9 % IV BOLUS (SEPSIS)
1000.0000 mL | Freq: Once | INTRAVENOUS | Status: AC
  Administered 2015-09-13: 1000 mL via INTRAVENOUS

## 2015-09-13 MED ORDER — ONDANSETRON HCL 4 MG/2ML IJ SOLN: 4.0000 mg | Freq: Once | INTRAMUSCULAR | Status: DC

## 2015-09-13 MED ORDER — SODIUM CHLORIDE 0.9 % IV BOLUS (SEPSIS)
1000.0000 mL | Freq: Once | INTRAVENOUS | Status: AC
  Administered 2015-09-14: 1000 mL via INTRAVENOUS

## 2015-09-13 MED ORDER — DEXTROSE 5 % IV SOLN
500.0000 mg | Freq: Once | INTRAVENOUS | Status: AC
  Administered 2015-09-14: 500 mg via INTRAVENOUS
  Filled 2015-09-13: qty 500

## 2015-09-13 MED ORDER — DIPHENHYDRAMINE HCL 50 MG/ML IJ SOLN
25.0000 mg | Freq: Once | INTRAMUSCULAR | Status: AC
  Administered 2015-09-13: 25 mg via INTRAVENOUS
  Filled 2015-09-13: qty 1

## 2015-09-13 MED ORDER — DEXTROSE 5 % IV SOLN
1.0000 g | Freq: Once | INTRAVENOUS | Status: AC
  Administered 2015-09-13: 1 g via INTRAVENOUS
  Filled 2015-09-13: qty 10

## 2015-09-13 MED ORDER — KETOROLAC TROMETHAMINE 30 MG/ML IJ SOLN
30.0000 mg | Freq: Once | INTRAMUSCULAR | Status: AC
  Administered 2015-09-13: 30 mg via INTRAVENOUS
  Filled 2015-09-13: qty 1

## 2015-09-13 NOTE — ED Provider Notes (Signed)
Medical screening examination/treatment/procedure(s) were conducted as a shared visit with non-physician practitioner(s) and myself.  I personally evaluated the patient during the encounter.   EKG Interpretation None     55 y.o. female presents with CAP, new oxygen requirement. No acute distress on exam.. Admit for weaning and IV ABx.  See related encounter note   Lyndal Pulley, MD 09/13/15 2241

## 2015-09-13 NOTE — ED Notes (Signed)
No answer when name called x 2

## 2015-09-13 NOTE — ED Notes (Signed)
Pt was sent from [facility] for eval of flu like symptoms. Pt reports N/V cough and fevers.

## 2015-09-13 NOTE — ED Provider Notes (Signed)
CSN: [id number]     Arrival date & time 09/13/15  1718 History   First MD Initiated Contact with Patient 09/13/15 2044     Chief Complaint  Patient presents with  . Influenza     (Consider location/radiation/quality/duration/timing/severity/associated sxs/prior Treatment) HPI   Martha Buck is a 55 y.o. female, patient with no pertinent past medical history, presenting to the ED with nausea, vomiting, headache, subjective fever, and a nonproductive cough for the last six days. Pt rates her headache at 9/10, bilateral over her whole head, throbbing, and nonradiating. Pt states she has headaches before and this feels no worse than those isolated headaches. Pt have had three episodes of emesis over the last 24 hours. Pt denies abdominal pain, diarrhea, shortness of breath, chest pain, or any other complaints.     Past Medical History  Diagnosis Date  . 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 Acute meniscal tear of knee     RIGHT  . Depression   . Back pain    Past Surgical History  Procedure Laterality Date  . Lumbar fusion  03-20-2010    L4 -- L5  . Ectopic pregnancy surgery  1991  . Knee arthroscopy with medial menisectomy Right 11/26/2012    Procedure: KNEE ARTHROSCOPY WITH PARTIAL MEDIAL MENISECTOMY,DEBRIDEMENT;  Surgeon: Javier Docker, MD;  Location: [facility];  Service: Orthopedics;  Laterality: Right;  . Back surgery     No family history on file. Social History  Substance Use Topics  . Smoking status: Current Every Day Smoker -- 0.00 packs/day for 34 years    Types: Cigarettes  . Smokeless tobacco: Never Used     Comment: smokes 3 cig daily  . Alcohol Use: Yes     Comment: occasonal   OB History    No data available     Review of Systems  Constitutional: Positive for fever, chills and fatigue. Negative for diaphoresis.  Respiratory: Positive for cough. Negative for shortness of breath.   Cardiovascular: Negative for chest  pain.  Gastrointestinal: Positive for nausea and vomiting. Negative for abdominal pain, diarrhea, constipation and blood in stool.  Genitourinary: Negative for dysuria and hematuria.  Skin: Negative for color change and pallor.  Neurological: Positive for headaches. Negative for dizziness, syncope and light-headedness.  All other systems reviewed and are negative.     Allergies  Aspirin and Morphine and related  Home Medications   Prior to Admission medications   Medication Sig Start Date End Date Taking? Authorizing Provider  acetaminophen (TYLENOL) 500 MG tablet Take 500 mg by mouth every 6 (six) hours as needed for mild pain.   Yes Historical Provider, MD  amitriptyline (ELAVIL) 50 MG tablet Take 50 mg by mouth at bedtime as needed for sleep.   Yes Historical Provider, MD  EPINEPHrine (EPIPEN 2-PAK) 0.3 mg/0.3 mL IJ SOAJ injection Inject 0.3 mLs (0.3 mg total) into the muscle once as needed (for severe allergic reaction). CAll 911 immediately if you have to use this medicine 02/22/15  Yes Victorino Dike Piepenbrink, PA-C  famotidine (PEPCID) 20 MG tablet Take 1 tablet (20 mg total) by mouth 2 (two) times daily. 02/22/15  Yes Jennifer Piepenbrink, PA-C  gabapentin (NEURONTIN) 600 MG tablet Take 600 mg by mouth 2 (two) times daily.   Yes Historical Provider, MD   BP 106/70 mmHg  Pulse 85  Temp(Src) 99.5 F (37.5 C) (Oral)  Resp 24  SpO2 95%  LMP 04/26/2012 Physical Exam  Constitutional: She appears well-developed and well-nourished. No distress.  HENT:  Head: Normocephalic and atraumatic.  Eyes: Conjunctivae are normal. Pupils are equal, round, and reactive to light.  Cardiovascular: Normal rate, regular rhythm and normal heart sounds.   Pulmonary/Chest: Breath sounds normal. Tachypnea noted. No respiratory distress.  Abdominal: Soft. Bowel sounds are normal.  Musculoskeletal: She exhibits no edema or tenderness.  Neurological: She is alert.  Skin: Skin is warm. She is diaphoretic.   Nursing note and vitals reviewed.   ED Course  Procedures (including critical care time) Labs Review Labs Reviewed  COMPREHENSIVE METABOLIC PANEL - Abnormal; Notable for the following:    CO2 21 (*)    Glucose, Bld 108 (*)    Creatinine, Ser 1.03 (*)    Calcium 7.9 (*)    Total Protein 6.1 (*)    Albumin 3.1 (*)    All other components within normal limits  CBC WITH DIFFERENTIAL/PLATELET - Abnormal; Notable for the following:    WBC 11.3 (*)    Neutro Abs 8.3 (*)    All other components within normal limits  I-STAT CG4 LACTIC ACID, ED  I-STAT CG4 LACTIC ACID, ED    Imaging Review Dg Chest 2 View  09/13/2015  CLINICAL DATA:  Flu like symptoms EXAM: CHEST  2 VIEW COMPARISON:  10/13/2014 FINDINGS: Cardiomediastinal silhouette is stable. Chronic elevation of the left hemidiaphragm again noted. There is patchy infiltrate/pneumonia in right midlung perihilar region. IMPRESSION: Patchy infiltrate/pneumonia right midlung perihilar region. Chronic elevation of the left hemidiaphragm. Electronically Signed   By: Natasha Mead M.D.   On: 09/13/2015 18:10   I have personally reviewed and evaluated these images and lab results as part of my medical decision-making.   EKG Interpretation None      MDM   Final diagnoses:  Non-intractable vomiting with nausea, vomiting of unspecified type  Acute nonintractable headache, unspecified headache type  Cough    Martha Buck presents with nausea, vomiting, headache, and cough for the last 6 days.  Findings and plan of care discussed with Lyndal Pulley, MD.  This patient's presentation combined with the finding of an infiltrate on chest x-ray suggests a pneumonia. She is nontoxic appearing, is currently afebrile, is not tachycardic, and is normotensive, however, patient is somewhat tachypneic and her SPO2 dropped to 91-92%. Oxygen was ordered to maintain the patient's SPO2 above 94%. With 2 L of oxygen patient's SPO2 rose to 97%. Patient is  noted to be sleeping comfortably when I entered the room. Patient to receive rehydration therapy, antiemetics, and anti-inflammatory medications. Patient was checked and reassessed multiple times during her stay in the ED with improvement noted and voiced by the patient. 10:44 PM Spoke with Dr. Toniann Fail who agreed to admit the patient to MedSurg. No further instructions. The patient was informed of the admission and states she is comfortable with it.  Filed Vitals:   09/13/15 2100 09/13/15 2130 09/13/15 2238 09/13/15 2239  BP: 122/83 126/80 106/70   Pulse: 84 89 85   Temp:    99.5 F (37.5 C)  TempSrc:    Oral  Resp:      SpO2: 91% 93% 95%      Anselm Pancoast, PA-C 09/13/15 2302  Lyndal Pulley, MD 09/14/15 [phone]

## 2015-09-13 NOTE — ED Provider Notes (Addendum)
CSN: [id number]     Arrival date & time 09/13/15  1452 History   None    Chief Complaint  Patient presents with  . Fever   (Consider location/radiation/quality/duration/timing/severity/associated sxs/prior Treatment) Patient is a 55 y.o. female presenting with fever. The history is provided by the patient.  Fever Temp source:  Subjective Severity:  Mild Onset quality:  Gradual Duration:  4 days Progression:  Improving Chronicity:  New Associated symptoms: headaches, myalgias and vomiting   Associated symptoms: no cough and no diarrhea     Past Medical History  Diagnosis Date  . 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 Acute meniscal tear of knee     RIGHT  . Depression   . Back pain    Past Surgical History  Procedure Laterality Date  . Lumbar fusion  03-20-2010    L4 -- L5  . Ectopic pregnancy surgery  1991  . Knee arthroscopy with medial menisectomy Right 11/26/2012    Procedure: KNEE ARTHROSCOPY WITH PARTIAL MEDIAL MENISECTOMY,DEBRIDEMENT;  Surgeon: Javier Docker, MD;  Location: [facility] [place];  Service: Orthopedics;  Laterality: Right;  . Back surgery     History reviewed. No pertinent family history. Social History  Substance Use Topics  . Smoking status: Current Every Day Smoker -- 0.00 packs/day for 34 years    Types: Cigarettes  . Smokeless tobacco: Never Used     Comment: smokes 3 cig daily  . Alcohol Use: Yes     Comment: occasonal   OB History    No data available     Review of Systems  Constitutional: Positive for fever. Negative for appetite change.  Respiratory: Negative for cough.   Gastrointestinal: Positive for vomiting. Negative for diarrhea.  Musculoskeletal: Positive for myalgias.  Neurological: Positive for headaches.  All other systems reviewed and are negative.   Allergies  Aspirin and Morphine and related  Home Medications   Prior to Admission medications   Medication Sig Start Date End Date Taking?  Authorizing Provider  acetaminophen (TYLENOL) 500 MG tablet Take 500 mg by mouth every 6 (six) hours as needed for mild pain.    Historical Provider, MD  amitriptyline (ELAVIL) 50 MG tablet Take 50 mg by mouth at bedtime as needed for sleep.    Historical Provider, MD  diphenhydrAMINE (BENADRYL) 25 MG tablet Take 1 tablet (25 mg total) by mouth every 6 (six) hours as needed for itching (Rash). 02/22/15   Jennifer Piepenbrink, PA-C  doxycycline (VIBRA-TABS) 100 MG tablet Take 1 tablet (100 mg total) by mouth 2 (two) times daily. 04/07/15   Earley Favor, NP  EPINEPHrine (EPIPEN 2-PAK) 0.3 mg/0.3 mL IJ SOAJ injection Inject 0.3 mLs (0.3 mg total) into the muscle once as needed (for severe allergic reaction). CAll 911 immediately if you have to use this medicine 02/22/15   Francee Piccolo, PA-C  erythromycin ophthalmic ointment Place 1 application into the left eye every 6 (six) hours. Place 1/2 inch ribbon of ointment in the affected eye 4 times a day 02/28/15   Joycie Peek, PA-C  famotidine (PEPCID) 20 MG tablet Take 1 tablet (20 mg total) by mouth 2 (two) times daily. 02/22/15   Jennifer Piepenbrink, PA-C  gabapentin (NEURONTIN) 600 MG tablet Take 600 mg by mouth 2 (two) times daily.    Historical Provider, MD  HYDROcodone-acetaminophen (NORCO) 5-325 MG per tablet Take 1 tablet by mouth every 6 (six) hours as needed for moderate pain. 05/06/15   Denny Peon  Chip Boer, MD  ibuprofen (ADVIL,MOTRIN) 600 MG tablet Take 1 tablet (600 mg total) by mouth 3 (three) times daily. 04/07/15   Earley Favor, NP  methocarbamol (ROBAXIN) 500 MG tablet Take 1 tablet (500 mg total) by mouth 2 (two) times daily as needed for muscle spasms. 03/06/15   Everlene Farrier, PA-C  naproxen (NAPROSYN) 250 MG tablet Take 1 tablet (250 mg total) by mouth 2 (two) times daily with a meal. 03/06/15   Everlene Farrier, PA-C  predniSONE (DELTASONE) 20 MG tablet Take 2 tablets (40 mg total) by mouth daily. 02/22/15   Francee Piccolo, PA-C   Meds  Ordered and Administered this Visit   Medications  ondansetron [facility]) injection 4 mg (not administered)  sodium chloride 0.9 % bolus 1,000 mL (not administered)    BP 91/60 mmHg  Pulse 102  Temp(Src) 100 F (37.8 C) (Oral)  Resp 18  SpO2 100%  LMP 04/26/2012 No data found.   Physical Exam  Constitutional: She is oriented to person, place, and time. She appears well-developed and well-nourished. She appears distressed.  HENT:  Mouth/Throat: Oropharynx is clear and moist.  Eyes: Conjunctivae are normal. Pupils are equal, round, and reactive to light.  Neck: Muscular tenderness present. No rigidity. No Brudzinski's sign and no Kernig's sign noted.  Cardiovascular: Normal heart sounds and intact distal pulses.   Pulmonary/Chest: Effort normal and breath sounds normal.  Abdominal: Soft. Bowel sounds are normal. There is no tenderness.  Lymphadenopathy:    She has no cervical adenopathy.  Neurological: She is alert and oriented to person, place, and time. No cranial nerve deficit.  Skin: Skin is warm and dry.  Nursing note and vitals reviewed.   ED Course  Procedures (including critical care time)  Labs Review Labs Reviewed  POCT I-STAT, CHEM 8 - Abnormal; Notable for the following:    Glucose, Bld 116 (*)    Calcium, Ion 1.04 (*)    Hemoglobin 16.3 (*)    HCT 48.0 (*)    All other components within normal limits    Imaging Review No results found.   Visual Acuity Review  Right Eye Distance:   Left Eye Distance:   Bilateral Distance:    Right Eye Near:   Left Eye Near:    Bilateral Near:         MDM   1. Gastroenteritis    Sent for eval of fever, vomiting and headache. IV fluids started. zofran given.   Meds ordered this encounter  Medications  . ondansetron (ZOFRAN) injection 4 mg    Sig:   . sodium chloride 0.9 % bolus 1,000 mL    Sig:       Linna Hoff, MD 09/13/15 1636  Linna Hoff, MD 09/13/15 1640

## 2015-09-13 NOTE — ED Notes (Signed)
Pt  Reports  Body  Aches     Fever   Vomiting   For  Several  Days

## 2015-09-14 ENCOUNTER — Inpatient Hospital Stay (HOSPITAL_COMMUNITY): Payer: Medicare Other

## 2015-09-14 ENCOUNTER — Encounter (HOSPITAL_COMMUNITY): Payer: Self-pay | Admitting: Internal Medicine

## 2015-09-14 DIAGNOSIS — J189 Pneumonia, unspecified organism: Secondary | ICD-10-CM

## 2015-09-14 DIAGNOSIS — K8020 Calculus of gallbladder without cholecystitis without obstruction: Secondary | ICD-10-CM | POA: Diagnosis present

## 2015-09-14 DIAGNOSIS — R112 Nausea with vomiting, unspecified: Secondary | ICD-10-CM

## 2015-09-14 DIAGNOSIS — R1110 Vomiting, unspecified: Secondary | ICD-10-CM | POA: Insufficient documentation

## 2015-09-14 LAB — BASIC METABOLIC PANEL
Anion gap: 5 (ref 5–15)
BUN: 12 mg/dL (ref 6–20)
CHLORIDE: 114 mmol/L — AB (ref 101–111)
CO2: 21 mmol/L — AB (ref 22–32)
CREATININE: 0.81 mg/dL (ref 0.44–1.00)
Calcium: 6.8 mg/dL — ABNORMAL LOW (ref 8.9–10.3)
GFR calc Af Amer: >60 mL/min (ref >60)
GFR calc non Af Amer: >60 mL/min (ref >60)
GLUCOSE: 105 mg/dL — AB (ref 65–99)
POTASSIUM: 3.6 mmol/L (ref 3.5–5.1)
Sodium: 140 mmol/L (ref 135–145)

## 2015-09-14 LAB — HEPATIC FUNCTION PANEL
ALK PHOS: 40 U/L (ref 38–126)
ALT: 20 U/L (ref 14–54)
AST: 27 U/L (ref 15–41)
Albumin: 2.4 g/dL — ABNORMAL LOW (ref 3.5–5.0)
Bilirubin, Direct: <0.1 mg/dL — ABNORMAL LOW (ref 0.1–0.5)
TOTAL PROTEIN: 4.8 g/dL — AB (ref 6.5–8.1)

## 2015-09-14 LAB — CBC
HCT: 34.7 % — ABNORMAL LOW (ref 36.0–46.0)
HEMATOCRIT: 33.9 % — AB (ref 36.0–46.0)
HEMOGLOBIN: 11.1 g/dL — AB (ref 12.0–15.0)
HEMOGLOBIN: 11.1 g/dL — AB (ref 12.0–15.0)
MCH: 30.5 pg (ref 26.0–34.0)
MCH: 31.3 pg (ref 26.0–34.0)
MCHC: 32 g/dL (ref 30.0–36.0)
MCHC: 32.7 g/dL (ref 30.0–36.0)
MCV: 95.3 fL (ref 78.0–100.0)
MCV: 95.5 fL (ref 78.0–100.0)
Platelets: 181 10*3/uL (ref 150–400)
Platelets: 184 10*3/uL (ref 150–400)
RBC: 3.64 MIL/uL — AB (ref 3.87–5.11)
RBC: 3.55 MIL/uL — ABNORMAL LOW (ref 3.87–5.11)
RDW: 14.3 % (ref 11.5–15.5)
RDW: 14.3 % (ref 11.5–15.5)
WBC: 8 10*3/uL (ref 4.0–10.5)
WBC: 8.6 10*3/uL (ref 4.0–10.5)

## 2015-09-14 LAB — TROPONIN I

## 2015-09-14 LAB — STREP PNEUMONIAE URINARY ANTIGEN: Strep Pneumo Urinary Antigen: NEGATIVE

## 2015-09-14 LAB — INFLUENZA PANEL BY PCR (TYPE A & B)
H1N1 flu by pcr: NOT DETECTED
INFLBPCR: NEGATIVE
Influenza A By PCR: NEGATIVE

## 2015-09-14 LAB — CREATININE, SERUM: CREATININE: 0.79 mg/dL (ref 0.44–1.00)

## 2015-09-14 LAB — LIPASE, BLOOD: LIPASE: 26 U/L (ref 11–51)

## 2015-09-14 LAB — HIV ANTIBODY (ROUTINE TESTING W REFLEX): HIV Screen 4th Generation wRfx: NONREACTIVE

## 2015-09-14 MED ORDER — ALBUTEROL SULFATE (2.5 MG/3ML) 0.083% IN NEBU: 2.5000 mg | INHALATION_SOLUTION | RESPIRATORY_TRACT | Status: DC | PRN

## 2015-09-14 MED ORDER — ONDANSETRON HCL 4 MG/2ML IJ SOLN: 4.0000 mg | Freq: Three times a day (TID) | INTRAMUSCULAR | Status: DC | PRN

## 2015-09-14 MED ORDER — BENZONATATE 100 MG PO CAPS: 100.0000 mg | ORAL_CAPSULE | Freq: Three times a day (TID) | ORAL | Status: DC

## 2015-09-14 MED ORDER — TECHNETIUM TC 99M MEBROFENIN IV KIT: 5.0000 | PACK | Freq: Once | INTRAVENOUS | Status: DC | PRN

## 2015-09-14 MED ORDER — ONDANSETRON HCL 4 MG/2ML IJ SOLN: 4.0000 mg | Freq: Four times a day (QID) | INTRAMUSCULAR | Status: DC | PRN

## 2015-09-14 MED ORDER — ACETAMINOPHEN 650 MG RE SUPP: 650.0000 mg | Freq: Four times a day (QID) | RECTAL | Status: DC | PRN

## 2015-09-14 MED ORDER — SODIUM CHLORIDE 0.9 % IV SOLN
INTRAVENOUS | Status: DC
  Administered 2015-09-14: 04:00:00 via INTRAVENOUS

## 2015-09-14 MED ORDER — BENZONATATE 100 MG PO CAPS
100.0000 mg | ORAL_CAPSULE | Freq: Three times a day (TID) | ORAL | Status: DC | PRN
  Administered 2015-09-14: 100 mg via ORAL
  Filled 2015-09-14: qty 1

## 2015-09-14 MED ORDER — GUAIFENESIN ER 600 MG PO TB12
1200.0000 mg | ORAL_TABLET | Freq: Two times a day (BID) | ORAL | Status: DC
  Administered 2015-09-14 – 2015-09-16 (×5): 1200 mg via ORAL
  Filled 2015-09-14 (×5): qty 2

## 2015-09-14 MED ORDER — AMITRIPTYLINE HCL 50 MG PO TABS: 50.0000 mg | ORAL_TABLET | Freq: Every evening | ORAL | Status: DC | PRN

## 2015-09-14 MED ORDER — ONDANSETRON HCL 4 MG PO TABS
4.0000 mg | ORAL_TABLET | Freq: Four times a day (QID) | ORAL | Status: DC | PRN
Start: 2015-09-14 — End: 2015-09-16
  Administered 2015-09-16: 4 mg via ORAL
  Filled 2015-09-14: qty 1

## 2015-09-14 MED ORDER — GABAPENTIN 600 MG PO TABS
600.0000 mg | ORAL_TABLET | Freq: Two times a day (BID) | ORAL | Status: DC
  Filled 2015-09-14 (×3): qty 1

## 2015-09-14 MED ORDER — DEXTROSE 5 % IV SOLN
1.0000 g | INTRAVENOUS | Status: DC
  Administered 2015-09-14 – 2015-09-15 (×2): 1 g via INTRAVENOUS
  Filled 2015-09-14 (×4): qty 10

## 2015-09-14 MED ORDER — GABAPENTIN 300 MG PO CAPS
600.0000 mg | ORAL_CAPSULE | Freq: Two times a day (BID) | ORAL | Status: DC
  Administered 2015-09-14 – 2015-09-16 (×5): 600 mg via ORAL
  Filled 2015-09-14 (×5): qty 2

## 2015-09-14 MED ORDER — ENOXAPARIN SODIUM 40 MG/0.4ML ~~LOC~~ SOLN
40.0000 mg | SUBCUTANEOUS | Status: DC
  Administered 2015-09-14 – 2015-09-16 (×3): 40 mg via SUBCUTANEOUS
  Filled 2015-09-14 (×3): qty 0.4

## 2015-09-14 MED ORDER — ACETAMINOPHEN 325 MG PO TABS
650.0000 mg | ORAL_TABLET | Freq: Four times a day (QID) | ORAL | Status: DC | PRN
Start: 2015-09-14 — End: 2015-09-16
  Administered 2015-09-14 – 2015-09-16 (×3): 650 mg via ORAL
  Filled 2015-09-14 (×3): qty 2

## 2015-09-14 MED ORDER — FAMOTIDINE 20 MG PO TABS
20.0000 mg | ORAL_TABLET | Freq: Two times a day (BID) | ORAL | Status: DC
  Administered 2015-09-14 – 2015-09-16 (×5): 20 mg via ORAL
  Filled 2015-09-14 (×5): qty 1

## 2015-09-14 MED ORDER — DEXTROSE 5 % IV SOLN
500.0000 mg | INTRAVENOUS | Status: DC
  Administered 2015-09-15 (×2): 500 mg via INTRAVENOUS
  Filled 2015-09-14 (×3): qty 500

## 2015-09-14 NOTE — Progress Notes (Signed)
SATURATION QUALIFICATIONS: (This note is used to comply with regulatory documentation for home oxygen)  Patient Saturations on Room Air at Rest = 99%  Patient Saturations on Room Air while Ambulating = 86%  Patient Saturations on 2 Liters of oxygen while Ambulating = 97%  Please briefly explain why patient needs home oxygen:

## 2015-09-14 NOTE — H&P (Signed)
[facility] History and Physical  Martha Buck ZOX:[id number] DOB: 05/30/1961 DOA: 09/13/2015  Referring physician: Mr.Joy. PCP: No PCP Per Patient  Specialists: None.  Chief Complaint: Shortness of breath.  HPI: Martha Buck is a 55 y.o. female with no significant past medical history who was referred to the ER from urgent care center after patient was experiencing weakness with shortness of breath productive cough over the last 4-5 days. Patient also had 2-3 episodes of nausea and vomiting yesterday. Denies any chest pain. Chest x-ray in the ER was showing patchy infiltrates and patient was medically tachypneic and was also mildly having low normal blood pressure. Patient has been admitted for community-acquired pneumonia. Since patient has been having some right upper quadrant pain sonogram of the abdomen was done which shows gallstones with no features of acute cholecystitis. Patient has been admitted for further management of community acquired pneumonia.   Review of Systems: As presented in the history of presenting illness, rest negative.  Past Medical History  Diagnosis Date  . 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 Acute meniscal tear of knee     RIGHT  . Depression   . Back pain    Past Surgical History  Procedure Laterality Date  . Lumbar fusion  03-20-2010    L4 -- L5  . Ectopic pregnancy surgery  1991  . Knee arthroscopy with medial menisectomy Right 11/26/2012    Procedure: KNEE ARTHROSCOPY WITH PARTIAL MEDIAL MENISECTOMY,DEBRIDEMENT;  Surgeon: Javier Docker, MD;  Location: [facility];  Service: Orthopedics;  Laterality: Right;  . Back surgery     Social History:  reports that she has quit smoking. Her smoking use included Cigarettes. She smoked 0.00 packs per day for 34 years. She has never used smokeless tobacco. She reports that she drinks alcohol. She reports that she does not use illicit drugs. Where does patient live  home. Can patient participate in ADLs? Yes.  Allergies  Allergen Reactions  . Aspirin Itching  . Morphine And Related Itching    Family History:  Family History  Problem Relation Age of Onset  . Diabetes Mellitus II Mother   . Hypertension Mother       Prior to Admission medications   Medication Sig Start Date End Date Taking? Authorizing Provider  acetaminophen (TYLENOL) 500 MG tablet Take 500 mg by mouth every 6 (six) hours as needed for mild pain.   Yes Historical Provider, MD  amitriptyline (ELAVIL) 50 MG tablet Take 50 mg by mouth at bedtime as needed for sleep.   Yes Historical Provider, MD  EPINEPHrine (EPIPEN 2-PAK) 0.3 mg/0.3 mL IJ SOAJ injection Inject 0.3 mLs (0.3 mg total) into the muscle once as needed (for severe allergic reaction). CAll 911 immediately if you have to use this medicine 02/22/15  Yes Victorino Dike Piepenbrink, PA-C  famotidine (PEPCID) 20 MG tablet Take 1 tablet (20 mg total) by mouth 2 (two) times daily. 02/22/15  Yes Jennifer Piepenbrink, PA-C  gabapentin (NEURONTIN) 600 MG tablet Take 600 mg by mouth 2 (two) times daily.   Yes Historical Provider, MD    Physical Exam: Filed Vitals:   09/13/15 2315 09/13/15 2330 09/13/15 2345 09/14/15 0000  BP: 112/75 109/72 101/66 117/69  Pulse: 74 73 77 74  Temp:      TempSrc:      Resp:      SpO2: 96% 97% 98% 98%     General:  Moderately built and nourished.  Eyes: Anicteric no pallor.  ENT: No discharge from the ears eyes nose mouth.  Neck: No mass felt.  Cardiovascular: S1 and S2 heard.  Respiratory: No rhonchi or crepitations.  Abdomen: Soft mild right upper quadrant tenderness. No guarding or rigidity.  Skin: No rash.  Musculoskeletal: No edema.  Psychiatric: Appears normal.  Neurologic: Alert awake oriented to time place and person. Moves all extremities.  Labs on Admission:  Basic Metabolic Panel:  Recent Labs Lab 09/13/15 1537 09/13/15 1732  NA 140 139  K 3.8 3.8  CL 104 106  CO2   --  21*  GLUCOSE 116* 108*  BUN 18 15  CREATININE 1.00 1.03*  CALCIUM  --  7.9*   Liver Function Tests:  Recent Labs Lab 09/13/15 1732  AST 39  ALT 27  ALKPHOS 52  BILITOT 0.4  PROT 6.1*  ALBUMIN 3.1*   No results for input(s): LIPASE, AMYLASE in the last 168 hours. No results for input(s): AMMONIA in the last 168 hours. CBC:  Recent Labs Lab 09/13/15 1537 09/13/15 1732  WBC  --  11.3*  NEUTROABS  --  8.3*  HGB 16.3* 13.4  HCT 48.0* 40.7  MCV  --  94.4  PLT  --  222   Cardiac Enzymes: No results for input(s): CKTOTAL, CKMB, CKMBINDEX, TROPONINI in the last 168 hours.  BNP (last 3 results) No results for input(s): BNP in the last 8760 hours.  ProBNP (last 3 results) No results for input(s): PROBNP in the last 8760 hours.  CBG: No results for input(s): GLUCAP in the last 168 hours.  Radiological Exams on Admission: Dg Chest 2 View  09/13/2015  CLINICAL DATA:  Flu like symptoms EXAM: CHEST  2 VIEW COMPARISON:  10/13/2014 FINDINGS: Cardiomediastinal silhouette is stable. Chronic elevation of the left hemidiaphragm again noted. There is patchy infiltrate/pneumonia in right midlung perihilar region. IMPRESSION: Patchy infiltrate/pneumonia right midlung perihilar region. Chronic elevation of the left hemidiaphragm. Electronically Signed   By: Natasha Mead M.D.   On: 09/13/2015 18:10   US Abdomen Limited Ruq  09/14/2015  CLINICAL DATA:  Acute onset of right upper quadrant abdominal pain, nausea and vomiting. Initial encounter. EXAM: US ABDOMEN LIMITED - RIGHT UPPER QUADRANT COMPARISON:  None. FINDINGS: Gallbladder: Multiple large stones are seen within the gallbladder, measuring up to 2.8 cm in size. There is suggestion of scattered small mildly echogenic foci along the gallbladder wall, which may reflect small polyps. The gallbladder wall is borderline normal in thickness. No ultrasonographic Murphy's sign is elicited. Common bile duct: Diameter: 0.4 cm.  Not well seen  distally. Liver: No focal lesion identified. Within normal limits in parenchymal echogenicity. IMPRESSION: Cholelithiasis. Suggestion of small polyps along the gallbladder wall. No definite evidence for obstruction or cholecystitis. Electronically Signed   By: Roanna Raider M.D.   On: 09/14/2015 00:35     Assessment/Plan Active Problems:   Community acquired pneumonia   Cholelithiasis   Pneumonia   1. Community-acquired pneumonia - patient has been placed on ceftriaxone and Zithromax. Check influenza PCR urine strep antigen Legionella antigen and HIV status. 2. Nausea vomiting - probably from pneumonia. But since sonogram shows gallstones I have ordered a HIDA scan. 3. Gallstones with gallbladder polyp - follow HIDA scan. Eventually patient will need cholecystectomy. 4. Acute renal failure probably from nausea vomiting - gently hydrate and recheck metabolic panel.   DVT Prophylaxis Lovenox.  Code Status: Full code.  Family Communication: Discussed with patient.  Disposition Plan: Admit inpatient.  Xzaiver Vayda N. [facility] Pager [phone].  If 7PM-7AM, please contact night-coverage [email or web address] Password [facility] 09/14/2015, 12:59 AM

## 2015-09-14 NOTE — Progress Notes (Signed)
Utilization review completed.  

## 2015-09-14 NOTE — Progress Notes (Addendum)
Patient admitted after midnight Suspect can go home soon-- HIDA negative- will give food, check home O2 study and home in AM vs later today  Will keep at least another day as ambulatory O2 sats dropped to 86%  Marlin Canary DO

## 2015-09-15 MED ORDER — SENNA 8.6 MG PO TABS
1.0000 | ORAL_TABLET | Freq: Every day | ORAL | Status: DC | PRN
  Administered 2015-09-15: 8.6 mg via ORAL
  Filled 2015-09-15: qty 1

## 2015-09-15 NOTE — Care Management Note (Signed)
Case Management Note  Patient Details  Name: Martha Buck MRN: [id number] Date of Birth: Sep 29, 1960  Subjective/Objective:                  pneumonia  Action/Plan: CM spoke with patient at the bedside. Patient lives at home with her 34 and 54 year old daughters who are able to help her as needed. Reports she will have PCA services starting soon which Medicaid is providing. Will continue to follow.   Expected Discharge Date:    09/16/15              Expected Discharge Plan:  Home/Self Care  In-House Referral:     Discharge planning Services  CM Consult  Post Acute Care Choice:    Choice offered to:     DME Arranged:    DME Agency:     HH Arranged:    HH Agency:     Status of Service:  In process, will continue to follow  Medicare Important Message Given:    Date Medicare IM Given:    Medicare IM give by:    Date Additional Medicare IM Given:    Additional Medicare Important Message give by:     If discussed at Long Length of Stay Meetings, dates discussed:    Additional Comments:  Antony Haste, RN 09/15/2015, 11:22 AM

## 2015-09-15 NOTE — Progress Notes (Signed)
PROGRESS NOTE  Martha Buck AYT:[id number] DOB: 01-24-1961 DOA: 09/13/2015 PCP: No PCP Per Patient  Assessment/Plan: Community-acquired pneumonia -  -ceftriaxone and Zithromax.  -influenza PCR negative -still having fevers  Nausea/vomiting -  HIDA normal  Gallstones with gallbladder polyp - asymoptamic  Acute renal failure probably from nausea vomiting - resolved  Code Status: full Family Communication: patient Disposition Plan:    Consultants:    Procedures:     HPI/Subjective: +cough  Objective: Filed Vitals:   09/14/15 2152 09/15/15 0656  BP: 110/78 119/66  Pulse: 82 72  Temp: 100 F (37.8 C) 99.7 F (37.6 C)  Resp: 24 18    Intake/Output Summary (Last 24 hours) at 09/15/15 0843 Last data filed at 09/14/15 2008  Gross per 24 hour  Intake    990 ml  Output    850 ml  Net    140 ml   There were no vitals filed for this visit.  Exam:   General:  awake  Cardiovascular: rrr  Respiratory: coarse sounds, no wheezing  Abdomen: +BS, soft  Musculoskeletal: no edema   Data Reviewed: Basic Metabolic Panel:  Recent Labs Lab 09/13/15 1537 09/13/15 1732 09/14/15 0200 09/14/15 0730  NA 140 139  --  140  K 3.8 3.8  --  3.6  CL 104 106  --  114*  CO2  --  21*  --  21*  GLUCOSE 116* 108*  --  105*  BUN 18 15  --  12  CREATININE 1.00 1.03* 0.79 0.81  CALCIUM  --  7.9*  --  6.8*   Liver Function Tests:  Recent Labs Lab 09/13/15 1732 09/14/15 0730  AST 39 27  ALT 27 20  ALKPHOS 52 40  BILITOT 0.4 <0.1*  PROT 6.1* 4.8*  ALBUMIN 3.1* 2.4*    Recent Labs Lab 09/14/15 0200  LIPASE 26   No results for input(s): AMMONIA in the last 168 hours. CBC:  Recent Labs Lab 09/13/15 1537 09/13/15 1732 09/14/15 0200 09/14/15 0730  WBC  --  11.3* 8.0 8.6  NEUTROABS  --  8.3*  --   --   HGB 16.3* 13.4 11.1* 11.1*  HCT 48.0* 40.7 34.7* 33.9*  MCV  --  94.4 95.3 95.5  PLT  --  222 181 184   Cardiac Enzymes:  Recent Labs Lab  09/14/15 0200  TROPONINI <0.03   BNP (last 3 results) No results for input(s): BNP in the last 8760 hours.  ProBNP (last 3 results) No results for input(s): PROBNP in the last 8760 hours.  CBG: No results for input(s): GLUCAP in the last 168 hours.  No results found for this or any previous visit (from the past 240 hour(s)).   Studies: Dg Chest 2 View  09/13/2015  CLINICAL DATA:  Flu like symptoms EXAM: CHEST  2 VIEW COMPARISON:  10/13/2014 FINDINGS: Cardiomediastinal silhouette is stable. Chronic elevation of the left hemidiaphragm again noted. There is patchy infiltrate/pneumonia in right midlung perihilar region. IMPRESSION: Patchy infiltrate/pneumonia right midlung perihilar region. Chronic elevation of the left hemidiaphragm. Electronically Signed   By: Natasha Mead M.D.   On: 09/13/2015 18:10   Nm Hepatobiliary Liver Func  09/14/2015  CLINICAL DATA:  Shortness of breath and productive cough for the last 4-5 days. 2-3 episodes of nausea vomiting yesterday. Abdominal pain. EXAM: NUCLEAR MEDICINE HEPATOBILIARY IMAGING TECHNIQUE: Sequential images of the abdomen were obtained out to 60 minutes following intravenous administration of radiopharmaceutical. RADIOPHARMACEUTICALS:  5.0 millicuries Tc-82m  Choletec IV COMPARISON:  Ultrasound 09/14/2015 FINDINGS: There is early appropriate activity within the liver. Activity within the biliary tree is identified as early as 10 minutes. Activity within the bowel, as early as 15 minutes. Gallbladder activity is identified at 25 minute, normal. IMPRESSION: Normal hepatobiliary scan. No evidence for acute or chronic cholecystitis. Electronically Signed   By: Norva Pavlov M.D.   On: 09/14/2015 10:49   US Abdomen Limited Ruq  09/14/2015  CLINICAL DATA:  Acute onset of right upper quadrant abdominal pain, nausea and vomiting. Initial encounter. EXAM: US ABDOMEN LIMITED - RIGHT UPPER QUADRANT COMPARISON:  None. FINDINGS: Gallbladder: Multiple large stones  are seen within the gallbladder, measuring up to 2.8 cm in size. There is suggestion of scattered small mildly echogenic foci along the gallbladder wall, which may reflect small polyps. The gallbladder wall is borderline normal in thickness. No ultrasonographic Murphy's sign is elicited. Common bile duct: Diameter: 0.4 cm.  Not well seen distally. Liver: No focal lesion identified. Within normal limits in parenchymal echogenicity. IMPRESSION: Cholelithiasis. Suggestion of small polyps along the gallbladder wall. No definite evidence for obstruction or cholecystitis. Electronically Signed   By: Roanna Raider M.D.   On: 09/14/2015 00:35    Scheduled Meds: . azithromycin  500 mg Intravenous Q24H  . cefTRIAXone (ROCEPHIN)  IV  1 g Intravenous Q24H  . enoxaparin (LOVENOX) injection  40 mg Subcutaneous Q24H  . famotidine  20 mg Oral BID  . gabapentin  600 mg Oral BID  . guaiFENesin  1,200 mg Oral BID   Continuous Infusions:  Antibiotics Given (last 72 hours)    Date/Time Action Medication Dose Rate   09/14/15 2042 Given   cefTRIAXone (ROCEPHIN) 1 g in dextrose 5 % 50 mL IVPB 1 g 100 mL/hr   09/15/15 0036 Given   azithromycin (ZITHROMAX) 500 mg in dextrose 5 % 250 mL IVPB 500 mg 250 mL/hr      Active Problems:   Community acquired pneumonia   Cholelithiasis   Pneumonia    Time spent: 25 min    Railey Glad [facility]  [facility] Pager [phone] If 7PM-7AM, please contact night-coverage at [email or web address], password [facility] 09/15/2015, 8:43 AM  LOS: 2 days

## 2015-09-16 DIAGNOSIS — K8020 Calculus of gallbladder without cholecystitis without obstruction: Secondary | ICD-10-CM

## 2015-09-16 MED ORDER — SALINE SPRAY 0.65 % NA SOLN: 1.0000 | NASAL | Status: DC | PRN

## 2015-09-16 MED ORDER — LEVOFLOXACIN 750 MG PO TABS: 750.0000 mg | ORAL_TABLET | Freq: Every day | ORAL | Status: DC

## 2015-09-16 MED ORDER — BENZONATATE 100 MG PO CAPS
100.0000 mg | ORAL_CAPSULE | Freq: Three times a day (TID) | ORAL | Status: DC
  Administered 2015-09-16: 100 mg via ORAL
  Filled 2015-09-16: qty 1

## 2015-09-16 MED ORDER — SALINE SPRAY 0.65 % NA SOLN
1.0000 | NASAL | Status: DC | PRN
  Administered 2015-09-16: 1 via NASAL
  Filled 2015-09-16: qty 44

## 2015-09-16 MED ORDER — LEVOFLOXACIN 500 MG PO TABS
750.0000 mg | ORAL_TABLET | Freq: Every day | ORAL | Status: DC
  Administered 2015-09-16: 750 mg via ORAL
  Filled 2015-09-16: qty 2

## 2015-09-16 MED ORDER — GUAIFENESIN ER 600 MG PO TB12: 1200.0000 mg | ORAL_TABLET | Freq: Two times a day (BID) | ORAL | Status: DC

## 2015-09-16 MED ORDER — BENZONATATE 100 MG PO CAPS: 100.0000 mg | ORAL_CAPSULE | Freq: Three times a day (TID) | ORAL | Status: DC

## 2015-09-16 NOTE — Discharge Summary (Signed)
Physician Discharge Summary  Martha Buck WJX:[id number] DOB: 04-26-1961 DOA: 09/13/2015  PCP: No PCP Per Patient  Admit date: 09/13/2015 Discharge date: 09/16/2015  Time spent: 35 minutes  Recommendations for Outpatient Follow-up:  1. If voice quality does not improve, ENT follow up for eval   Discharge Diagnoses:  Active Problems:   Community acquired pneumonia   Cholelithiasis   Pneumonia   Discharge Condition: improved  Diet recommendation: carb mod  Filed Weights   09/15/15 1500  Weight: 93.895 kg (207 lb)    History of present illness:  Martha Buck is a 55 y.o. female with no significant past medical history who was referred to the ER from urgent care center after patient was experiencing weakness with shortness of breath productive cough over the last 4-5 days. Patient also had 2-3 episodes of nausea and vomiting yesterday. Denies any chest pain. Chest x-ray in the ER was showing patchy infiltrates and patient was medically tachypneic and was also mildly having low normal blood pressure. Patient has been admitted for community-acquired pneumonia. Since patient has been having some right upper quadrant pain sonogram of the abdomen was done which shows gallstones with no features of acute cholecystitis. Patient has been admitted for further management of community acquired pneumonia.   Hospital Course:  Community-acquired pneumonia -  -ceftriaxone and Zithromax- change to PO to finish course  -influenza PCR negative   Nausea/vomiting -  HIDA normal  Gallstones with gallbladder polyp - asymptomatic  Acute renal failure probably from nausea vomiting - resolved  Procedures:    Consultations:    Discharge Exam: Filed Vitals:   09/15/15 1347 09/15/15 2205  BP: 101/61 120/76  Pulse: 88 71  Temp: 99.5 F (37.5 C) 99.6 F (37.6 C)  Resp: 18 16    General: awake, NAD, lung clear, not requiring O2  Discharge Instructions   Discharge Instructions     Diet general    Complete by:  As directed      Discharge instructions    Complete by:  As directed   Rest voice, drink plenty of fluid     Increase activity slowly    Complete by:  As directed           Current Discharge Medication List    START taking these medications   Details  benzonatate (TESSALON) 100 MG capsule Take 1 capsule (100 mg total) by mouth 3 (three) times daily. Qty: 20 capsule, Refills: 0    guaiFENesin (MUCINEX) 600 MG 12 hr tablet Take 2 tablets (1,200 mg total) by mouth 2 (two) times daily. Qty: 20 tablet, Refills: 0    levofloxacin (LEVAQUIN) 750 MG tablet Take 1 tablet (750 mg total) by mouth daily. Qty: 5 tablet, Refills: 0    sodium chloride (OCEAN) 0.65 % SOLN nasal spray Place 1 spray into both nostrils as needed for congestion. Refills: 0      CONTINUE these medications which have NOT CHANGED   Details  acetaminophen (TYLENOL) 500 MG tablet Take 500 mg by mouth every 6 (six) hours as needed for mild pain.    amitriptyline (ELAVIL) 50 MG tablet Take 50 mg by mouth at bedtime as needed for sleep.    EPINEPHrine (EPIPEN 2-PAK) 0.3 mg/0.3 mL IJ SOAJ injection Inject 0.3 mLs (0.3 mg total) into the muscle once as needed (for severe allergic reaction). CAll 911 immediately if you have to use this medicine Qty: 2 Device, Refills: 1    famotidine (PEPCID) 20 MG tablet Take 1  tablet (20 mg total) by mouth 2 (two) times daily. Qty: 30 tablet, Refills: 0    gabapentin (NEURONTIN) 600 MG tablet Take 600 mg by mouth 2 (two) times daily.       Allergies  Allergen Reactions  . Aspirin Itching  . Morphine And Related Itching      The results of significant diagnostics from this hospitalization (including imaging, microbiology, ancillary and laboratory) are listed below for reference.    Significant Diagnostic Studies: Dg Chest 2 View  09/13/2015  CLINICAL DATA:  Flu like symptoms EXAM: CHEST  2 VIEW COMPARISON:  10/13/2014 FINDINGS:  Cardiomediastinal silhouette is stable. Chronic elevation of the left hemidiaphragm again noted. There is patchy infiltrate/pneumonia in right midlung perihilar region. IMPRESSION: Patchy infiltrate/pneumonia right midlung perihilar region. Chronic elevation of the left hemidiaphragm. Electronically Signed   By: Natasha Mead M.D.   On: 09/13/2015 18:10   Nm Hepatobiliary Liver Func  09/14/2015  CLINICAL DATA:  Shortness of breath and productive cough for the last 4-5 days. 2-3 episodes of nausea vomiting yesterday. Abdominal pain. EXAM: NUCLEAR MEDICINE HEPATOBILIARY IMAGING TECHNIQUE: Sequential images of the abdomen were obtained out to 60 minutes following intravenous administration of radiopharmaceutical. RADIOPHARMACEUTICALS:  5.0 millicuries Tc-89m  Choletec IV COMPARISON:  Ultrasound 09/14/2015 FINDINGS: There is early appropriate activity within the liver. Activity within the biliary tree is identified as early as 10 minutes. Activity within the bowel, as early as 15 minutes. Gallbladder activity is identified at 25 minute, normal. IMPRESSION: Normal hepatobiliary scan. No evidence for acute or chronic cholecystitis. Electronically Signed   By: Norva Pavlov M.D.   On: 09/14/2015 10:49   US Abdomen Limited Ruq  09/14/2015  CLINICAL DATA:  Acute onset of right upper quadrant abdominal pain, nausea and vomiting. Initial encounter. EXAM: US ABDOMEN LIMITED - RIGHT UPPER QUADRANT COMPARISON:  None. FINDINGS: Gallbladder: Multiple large stones are seen within the gallbladder, measuring up to 2.8 cm in size. There is suggestion of scattered small mildly echogenic foci along the gallbladder wall, which may reflect small polyps. The gallbladder wall is borderline normal in thickness. No ultrasonographic Murphy's sign is elicited. Common bile duct: Diameter: 0.4 cm.  Not well seen distally. Liver: No focal lesion identified. Within normal limits in parenchymal echogenicity. IMPRESSION: Cholelithiasis.  Suggestion of small polyps along the gallbladder wall. No definite evidence for obstruction or cholecystitis. Electronically Signed   By: Roanna Raider M.D.   On: 09/14/2015 00:35    Microbiology: Recent Results (from the past 240 hour(s))  Culture, blood (routine x 2) Call MD if unable to obtain prior to antibiotics being given     Status: None (Preliminary result)   Collection Time: 09/14/15  2:00 AM  Result Value Ref Range Status   Specimen Description BLOOD LEFT ARM  Final   Special Requests BOTTLES DRAWN AEROBIC AND ANAEROBIC  Final   Culture NO GROWTH 1 DAY  Final   Report Status PENDING  Incomplete  Culture, blood (routine x 2) Call MD if unable to obtain prior to antibiotics being given     Status: None (Preliminary result)   Collection Time: 09/14/15  2:10 AM  Result Value Ref Range Status   Specimen Description BLOOD LEFT WRIST  Final   Special Requests BOTTLES DRAWN AEROBIC AND ANAEROBIC  Final   Culture NO GROWTH 1 DAY  Final   Report Status PENDING  Incomplete     Labs: Basic Metabolic Panel:  Recent Labs Lab 09/13/15 1537 09/13/15 1732 09/14/15  0200 09/14/15 0730  NA 140 139  --  140  K 3.8 3.8  --  3.6  CL 104 106  --  114*  CO2  --  21*  --  21*  GLUCOSE 116* 108*  --  105*  BUN 18 15  --  12  CREATININE 1.00 1.03* 0.79 0.81  CALCIUM  --  7.9*  --  6.8*   Liver Function Tests:  Recent Labs Lab 09/13/15 1732 09/14/15 0730  AST 39 27  ALT 27 20  ALKPHOS 52 40  BILITOT 0.4 <0.1*  PROT 6.1* 4.8*  ALBUMIN 3.1* 2.4*    Recent Labs Lab 09/14/15 0200  LIPASE 26   No results for input(s): AMMONIA in the last 168 hours. CBC:  Recent Labs Lab 09/13/15 1537 09/13/15 1732 09/14/15 0200 09/14/15 0730  WBC  --  11.3* 8.0 8.6  NEUTROABS  --  8.3*  --   --   HGB 16.3* 13.4 11.1* 11.1*  HCT 48.0* 40.7 34.7* 33.9*  MCV  --  94.4 95.3 95.5  PLT  --  222 181 184   Cardiac Enzymes:  Recent Labs Lab 09/14/15 0200  TROPONINI <0.03    BNP: BNP (last 3 results) No results for input(s): BNP in the last 8760 hours.  ProBNP (last 3 results) No results for input(s): PROBNP in the last 8760 hours.  CBG: No results for input(s): GLUCAP in the last 168 hours.     Signed:  Allessandra Bernardi U Daneka Lantigua DO.  [facility] 09/16/2015, 1:17 PM

## 2015-09-16 NOTE — Progress Notes (Signed)
SATURATION QUALIFICATIONS: (This note is used to comply with regulatory documentation for home oxygen)  Patient Saturations on Room Air at Rest = 96%  Patient Saturations on Room Air while Ambulating = 96%  Please briefly explain why patient needs home oxygen: N/A

## 2015-09-17 LAB — LEGIONELLA ANTIGEN, URINE

## 2015-09-19 LAB — CULTURE, BLOOD (ROUTINE X 2)
CULTURE: NO GROWTH
Culture: NO GROWTH

## 2015-10-08 ENCOUNTER — Encounter (HOSPITAL_COMMUNITY): Payer: Self-pay

## 2015-10-08 ENCOUNTER — Encounter (HOSPITAL_COMMUNITY)
Admission: RE | Admit: 2015-10-08 | Discharge: 2015-10-08 | Disposition: A | Discharge status: Home / Self Care | Payer: Medicare Other | Source: Ambulatory Visit | Attending: Neurosurgery | Admitting: Neurosurgery

## 2015-10-08 DIAGNOSIS — Z01812 Encounter for preprocedural laboratory examination: Secondary | ICD-10-CM | POA: Diagnosis present

## 2015-10-08 DIAGNOSIS — Z0183 Encounter for blood typing: Secondary | ICD-10-CM | POA: Diagnosis not present

## 2015-10-08 HISTORY — DX: Personal history of urinary calculi: Z87.442

## 2015-10-08 HISTORY — DX: Personal history of pneumonia (recurrent): Z87.01

## 2015-10-08 LAB — BASIC METABOLIC PANEL
ANION GAP: 11 (ref 5–15)
BUN: 10 mg/dL (ref 6–20)
CALCIUM: 9 mg/dL (ref 8.9–10.3)
CO2: 25 mmol/L (ref 22–32)
CREATININE: 0.71 mg/dL (ref 0.44–1.00)
Chloride: 107 mmol/L (ref 101–111)
Glucose, Bld: 94 mg/dL (ref 65–99)
Potassium: 3.5 mmol/L (ref 3.5–5.1)
SODIUM: 143 mmol/L (ref 135–145)

## 2015-10-08 LAB — CBC
HCT: 36.2 % (ref 36.0–46.0)
HEMOGLOBIN: 12 g/dL (ref 12.0–15.0)
MCH: 31.7 pg (ref 26.0–34.0)
MCHC: 33.1 g/dL (ref 30.0–36.0)
MCV: 95.5 fL (ref 78.0–100.0)
PLATELETS: 230 10*3/uL (ref 150–400)
RBC: 3.79 MIL/uL — AB (ref 3.87–5.11)
RDW: 14 % (ref 11.5–15.5)
WBC: 8.6 10*3/uL (ref 4.0–10.5)

## 2015-10-08 LAB — TYPE AND SCREEN
ABO/RH(D): O POS
ANTIBODY SCREEN: NEGATIVE

## 2015-10-08 LAB — SURGICAL PCR SCREEN
MRSA, PCR: NEGATIVE
Staphylococcus aureus: NEGATIVE

## 2015-10-08 NOTE — Pre-Procedure Instructions (Signed)
    Marion Seese Jaber  10/08/2015      [id number] - [place], [place] - [address]  [place] [address] Phone: [phone] Fax: [phone]    Your procedure is scheduled on Tuesday, February 28th, 2017.  Report to [facility] Admitting at 5:30 A.M.    Call this number if you have problems the morning of surgery:  [phone]   Remember:  Do not eat food or drink liquids after midnight.   Take these medicines the morning of surgery with A SIP OF WATER: Acetaminophen (Tylenol) if needed, Famotidine (Pepcid), Gabapentin (Neurontin), Benzonatate (Tessalon), Oxycodone HCL if needed, nasal spray if needed.    You can uses your Epinephrine (Epipen) if needed for an allergic reaction.    Do not wear jewelry, make-up or nail polish.  Do not wear lotions, powders, or perfumes.  You may NOT wear deodorant.  Do not shave 48 hours prior to surgery.    Do not bring valuables to the hospital.   [facility] is not responsible for any belongings or valuables.  Contacts, dentures or bridgework may not be worn into surgery.  Leave your suitcase in the car.  After surgery it may be brought to your room.  For patients admitted to the hospital, discharge time will be determined by your treatment team.  Patients discharged the day of surgery will not be allowed to drive home.   Special instructions:  See attached.   Please read over the following fact sheets that you were given. Pain Booklet, Coughing and Deep Breathing, Blood Transfusion Information, MRSA Information and Surgical Site Infection Prevention

## 2015-10-08 NOTE — Progress Notes (Signed)
PCP - Clayburn Pert [facility] Cardiologist - denies  EKG - denies CXR - 09/13/15  Echo/stress test/cardiac cath - denies  Patient denies chest pain and shortness of breath at PAT appointment.  Patient states that she was admitted to the hospital for pneumonia 09/13/15 (see notes).  Patient states that she has returned to baseline.

## 2015-10-16 ENCOUNTER — Inpatient Hospital Stay (HOSPITAL_COMMUNITY): Admission: RE | Admit: 2015-10-16 | Payer: Medicare Other | Source: Ambulatory Visit | Admitting: Neurosurgery

## 2015-10-16 ENCOUNTER — Encounter (HOSPITAL_COMMUNITY): Admission: RE | Payer: Self-pay | Source: Ambulatory Visit

## 2015-10-16 SURGERY — POSTERIOR LUMBAR FUSION 1 WITH HARDWARE REMOVAL
Anesthesia: General | Site: Back

## 2015-12-05 DIAGNOSIS — M1711 Unilateral primary osteoarthritis, right knee: Secondary | ICD-10-CM | POA: Diagnosis not present

## 2015-12-12 DIAGNOSIS — M1711 Unilateral primary osteoarthritis, right knee: Secondary | ICD-10-CM | POA: Diagnosis not present

## 2015-12-19 DIAGNOSIS — M1711 Unilateral primary osteoarthritis, right knee: Secondary | ICD-10-CM | POA: Diagnosis not present

## 2015-12-26 DIAGNOSIS — S83206A Unspecified tear of unspecified meniscus, current injury, right knee, initial encounter: Secondary | ICD-10-CM | POA: Diagnosis not present

## 2016-01-01 DIAGNOSIS — M25561 Pain in right knee: Secondary | ICD-10-CM | POA: Diagnosis not present

## 2016-01-04 DIAGNOSIS — S83206D Unspecified tear of unspecified meniscus, current injury, right knee, subsequent encounter: Secondary | ICD-10-CM | POA: Diagnosis not present

## 2016-01-04 DIAGNOSIS — M1711 Unilateral primary osteoarthritis, right knee: Secondary | ICD-10-CM | POA: Diagnosis not present

## 2016-01-24 DIAGNOSIS — H11153 Pinguecula, bilateral: Secondary | ICD-10-CM | POA: Diagnosis not present

## 2016-01-24 DIAGNOSIS — H524 Presbyopia: Secondary | ICD-10-CM | POA: Diagnosis not present

## 2016-01-24 DIAGNOSIS — H5211 Myopia, right eye: Secondary | ICD-10-CM | POA: Diagnosis not present

## 2016-01-25 DIAGNOSIS — M1711 Unilateral primary osteoarthritis, right knee: Secondary | ICD-10-CM | POA: Diagnosis not present

## 2016-01-30 DIAGNOSIS — M25561 Pain in right knee: Secondary | ICD-10-CM | POA: Diagnosis not present

## 2016-01-30 DIAGNOSIS — M2241 Chondromalacia patellae, right knee: Secondary | ICD-10-CM | POA: Diagnosis not present

## 2016-02-01 DIAGNOSIS — M25561 Pain in right knee: Secondary | ICD-10-CM | POA: Diagnosis not present

## 2016-02-01 DIAGNOSIS — M2241 Chondromalacia patellae, right knee: Secondary | ICD-10-CM | POA: Diagnosis not present

## 2016-03-07 ENCOUNTER — Ambulatory Visit (HOSPITAL_COMMUNITY)
Admission: EM | Admit: 2016-03-07 | Discharge: 2016-03-07 | Disposition: A | Discharge status: Home / Self Care | Payer: Medicare Other | Attending: Family Medicine | Admitting: Family Medicine

## 2016-03-07 ENCOUNTER — Encounter (HOSPITAL_COMMUNITY): Payer: Self-pay | Admitting: *Deleted

## 2016-03-07 DIAGNOSIS — H1012 Acute atopic conjunctivitis, left eye: Secondary | ICD-10-CM

## 2016-03-07 DIAGNOSIS — B86 Scabies: Secondary | ICD-10-CM

## 2016-03-07 MED ORDER — PERMETHRIN 5 % EX CREA: TOPICAL_CREAM | CUTANEOUS | Status: DC

## 2016-03-07 MED ORDER — OLOPATADINE HCL 0.1 % OP SOLN: 1.0000 [drp] | Freq: Two times a day (BID) | OPHTHALMIC | Status: DC

## 2016-03-07 NOTE — Discharge Instructions (Signed)
Cool cloth to eye before drop, use as needed.use cream on body tonight, wash clothes and bed linens.

## 2016-03-07 NOTE — ED Notes (Signed)
Pt  Reports  Some swelling of  her  l  Eye        Pt  Also has  A  Rash  That  Itches     Pt  Ambulates  To room  With a  Steady  Fluid  Gait           Skin is warm  And  Dry pt  Is  Alert  /  Oriented      Appearing in no  Severe  Distress

## 2016-03-07 NOTE — ED Provider Notes (Signed)
CSN: [id number]     Arrival date & time 03/07/16  0957 History   First MD Initiated Contact with Patient 03/07/16 1017     Chief Complaint  Patient presents with  . Eye Problem   (Consider location/radiation/quality/duration/timing/severity/associated sxs/prior Treatment) Patient is a 55 y.o. female presenting with rash. The history is provided by the patient.  Rash Location:  Full body Quality: itchiness and swelling   Severity:  Mild Onset quality:  Sudden Duration:  1 day Progression:  Spreading Chronicity:  New Context comment:  Friend with scabies, pt now with same.   Past Medical History  Diagnosis Date  . 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 Acute meniscal tear of knee     RIGHT  . Depression   . Back pain   . History of pneumonia   . History of kidney stones    Past Surgical History  Procedure Laterality Date  . Lumbar fusion  03-20-2010    L4 -- L5  . Ectopic pregnancy surgery  1991  . Knee arthroscopy with medial menisectomy Right 11/26/2012    Procedure: KNEE ARTHROSCOPY WITH PARTIAL MEDIAL MENISECTOMY,DEBRIDEMENT;  Surgeon: Javier DockerJeffrey C Beane, MD;  [facility] [place];  Service: Orthopedics;  Laterality: Right;  . Back surgery     Family History  Problem Relation Age of Onset  . Diabetes Mellitus II Mother   . Hypertension Mother    Social History  Substance Use Topics  . Smoking status: Current Some Day Smoker -- 0.00 packs/day for 34 years    Types: Cigarettes  . Smokeless tobacco: Never Used     Comment: smokes 1 cig daily  . Alcohol Use: No   OB History    No data available     Review of Systems  Constitutional: Negative.   Eyes: Positive for redness and itching. Negative for pain and discharge.  Skin: Positive for rash.  All other systems reviewed and are negative.   Allergies  Bee venom; Aspirin; and Morphine and related  Home Medications   Prior to Admission medications   Medication Sig Start Date  End Date Taking? Authorizing Provider  acetaminophen (TYLENOL) 500 MG tablet Take 1,000 mg by mouth 2 (two) times daily as needed for mild pain or headache.     Historical Provider, MD  amitriptyline (ELAVIL) 50 MG tablet Take 50 mg by mouth at bedtime as needed for sleep.    Historical Provider, MD  benzonatate (TESSALON) 100 MG capsule Take 1 capsule (100 mg total) by mouth 3 (three) times daily. 09/16/15   Joseph ArtJessica U Vann, DO  EPINEPHrine (EPIPEN 2-PAK) 0.3 mg/0.3 mL IJ SOAJ injection Inject 0.3 mLs (0.3 mg total) into the muscle once as needed (for severe allergic reaction). CAll 911 immediately if you have to use this medicine 02/22/15   Francee PiccoloJennifer Piepenbrink, PA-C  famotidine (PEPCID) 20 MG tablet Take 1 tablet (20 mg total) by mouth 2 (two) times daily. 02/22/15   Jennifer Piepenbrink, PA-C  gabapentin (NEURONTIN) 600 MG tablet Take 600 mg by mouth 2 (two) times daily.    Historical Provider, MD  guaiFENesin (MUCINEX) 600 MG 12 hr tablet Take 2 tablets (1,200 mg total) by mouth 2 (two) times daily. 09/16/15   Joseph ArtJessica U Vann, DO  olopatadine (PATANOL) 0.1 % ophthalmic solution Place 1 drop into both eyes 2 (two) times daily. 03/07/16   Linna HoffJames D Kindl, MD  Oxycodone HCl 10 MG TABS Take 10 mg by mouth every 4 (  four) hours as needed (for pain).  09/26/15   Historical Provider, MD  permethrin (ELIMITE) 5 % cream Use as instructed on bottle, apply tonight , wash off in am. 03/07/16   Linna Hoff, MD  sodium chloride (OCEAN) 0.65 % SOLN nasal spray Place 1 spray into both nostrils as needed for congestion. 09/16/15   Joseph Art, DO   Meds Ordered and Administered this Visit  Medications - No data to display  BP 114/81 mmHg  Pulse 73  Temp(Src) 98 F (36.7 C) (Oral)  Resp 16  SpO2 98%  LMP 04/26/2012 No data found.   Physical Exam  Constitutional: She is oriented to person, place, and time. She appears well-developed and well-nourished. No distress.  HENT:  Right Ear: External ear normal.  Left  Ear: External ear normal.  Mouth/Throat: Oropharynx is clear and moist.  Eyes: Conjunctivae are normal. Pupils are equal, round, and reactive to light. Right eye exhibits no discharge. Left eye exhibits no discharge.  Neck: Normal range of motion. Neck supple.  Lymphadenopathy:    She has no cervical adenopathy.  Neurological: She is alert and oriented to person, place, and time.  Skin: Skin is warm and dry. Rash noted.  Scattered papular pruritic rash  Nursing note and vitals reviewed.   ED Course  Procedures (including critical care time)  Labs Review Labs Reviewed - No data to display  Imaging Review No results found.   Visual Acuity Review  Right Eye Distance:   Left Eye Distance:   Bilateral Distance:    Right Eye Near:   Left Eye Near:    Bilateral Near:         MDM   1. Scabies   2. Allergic conjunctivitis of left eye        Linna Hoff, MD 03/07/16 1026

## 2016-03-17 ENCOUNTER — Other Ambulatory Visit: Payer: Self-pay | Admitting: Physical Medicine and Rehabilitation

## 2016-03-17 DIAGNOSIS — M48061 Spinal stenosis, lumbar region without neurogenic claudication: Secondary | ICD-10-CM

## 2016-03-25 ENCOUNTER — Ambulatory Visit
Admission: RE | Admit: 2016-03-25 | Discharge: 2016-03-25 | Disposition: A | Discharge status: Home / Self Care | Payer: Worker's Compensation | Source: Ambulatory Visit | Attending: Physical Medicine and Rehabilitation | Admitting: Physical Medicine and Rehabilitation

## 2016-03-25 DIAGNOSIS — M48061 Spinal stenosis, lumbar region without neurogenic claudication: Secondary | ICD-10-CM

## 2016-03-25 MED ORDER — DIAZEPAM 5 MG PO TABS
10.0000 mg | ORAL_TABLET | Freq: Once | ORAL | Status: AC
  Administered 2016-03-25: 10 mg via ORAL

## 2016-03-25 MED ORDER — IOPAMIDOL (ISOVUE-M 200) INJECTION 41%
17.0000 mL | Freq: Once | INTRAMUSCULAR | Status: AC
  Administered 2016-03-25: 17 mL via INTRATHECAL

## 2016-03-25 NOTE — Discharge Instructions (Signed)
Myelogram Discharge Instructions  1. Go home and rest quietly for the next 24 hours.  It is important to lie flat for the next 24 hours.  Get up only to go to the restroom.  You may lie in the bed or on a couch on your back, your stomach, your left side or your right side.  You may have one pillow under your head.  You may have pillows between your knees while you are on your side or under your knees while you are on your back.  2. DO NOT drive today.  Recline the seat as far back as it will go, while still wearing your seat belt, on the way home.  3. You may get up to go to the bathroom as needed.  You may sit up for 10 minutes to eat.  You may resume your normal diet and medications unless otherwise indicated.  Drink lots of extra fluids today and tomorrow.  4. The incidence of headache, nausea, or vomiting is about 5% (one in 20 patients).  If you develop a headache, lie flat and drink plenty of fluids until the headache goes away.  Caffeinated beverages may be helpful.  If you develop severe nausea and vomiting or a headache that does not go away with flat bed rest, call [phone].  5. You may resume normal activities after your 24 hours of bed rest is over; however, do not exert yourself strongly or do any heavy lifting tomorrow. If when you get up you have a headache when standing, go back to bed and force fluids for another 24 hours.  6. Call your physician for a follow-up appointment.  The results of your myelogram will be sent directly to your physician by the following day.  7. If you have any questions or if complications develop after you arrive home, please call [phone].  Discharge instructions have been explained to the patient.  The patient, or the person responsible for the patient, fully understands these instructions.       May resume Elavil on Aug. 9, 2017 after 9:30 am.

## 2016-03-25 NOTE — Progress Notes (Signed)
Patient states she has been off Elavil for at least the past two days. 

## 2016-04-26 ENCOUNTER — Emergency Department (HOSPITAL_COMMUNITY)
Admission: EM | Admit: 2016-04-26 | Discharge: 2016-04-26 | Disposition: A | Discharge status: Home / Self Care | Payer: Medicare Other | Attending: Emergency Medicine | Admitting: Emergency Medicine

## 2016-04-26 ENCOUNTER — Encounter (HOSPITAL_COMMUNITY): Payer: Self-pay | Admitting: *Deleted

## 2016-04-26 DIAGNOSIS — G8929 Other chronic pain: Secondary | ICD-10-CM | POA: Diagnosis not present

## 2016-04-26 DIAGNOSIS — M545 Low back pain: Secondary | ICD-10-CM | POA: Diagnosis not present

## 2016-04-26 DIAGNOSIS — Z79899 Other long term (current) drug therapy: Secondary | ICD-10-CM | POA: Insufficient documentation

## 2016-04-26 DIAGNOSIS — F17210 Nicotine dependence, cigarettes, uncomplicated: Secondary | ICD-10-CM | POA: Diagnosis not present

## 2016-04-26 MED ORDER — METHOCARBAMOL 500 MG PO TABS
500.0000 mg | ORAL_TABLET | Freq: Once | ORAL | Status: AC
  Administered 2016-04-26: 500 mg via ORAL
  Filled 2016-04-26: qty 1

## 2016-04-26 MED ORDER — METHOCARBAMOL 500 MG PO TABS: 1000.0000 mg | ORAL_TABLET | Freq: Four times a day (QID) | ORAL | 0 refills | Status: DC | PRN

## 2016-04-26 MED ORDER — OXYCODONE-ACETAMINOPHEN 5-325 MG PO TABS
2.0000 | ORAL_TABLET | Freq: Once | ORAL | Status: AC
  Administered 2016-04-26: 2 via ORAL
  Filled 2016-04-26: qty 2

## 2016-04-26 NOTE — ED Provider Notes (Signed)
[facility]-EMERGENCY DEPT Provider Note   CSN: [id number] Arrival date & time: 04/26/16  1912   By signing my name below, I, Clovis PuAvnee Patel, attest that this documentation has been prepared under the direction and in the presence of  [employer] Nikelle Malatesta, PA-C. Electronically Signed: Clovis PuAvnee Patel, ED Scribe. 04/26/16. 7:46 PM.   History   Chief Complaint Chief Complaint  Patient presents with  . Back Pain  . Leg Pain     The history is provided by the patient. No language interpreter was used.    HPI Comments:  Martha Buck is a 55 y.o. female, with a PMHx of back pain and a PSHx of back surgery, who presents to the Emergency Department complaining of chronic, moderate lower back pain which worsened this morning. She describes the pain as being a "shooting" pain. She states the pain worsened when stood up after bending over. She notes she has taken Tramadol and Gabapentin with little relief. Pt states she has rods and screws in her back from a previous surgery. Pt denies fevers, changes in BMs and urinary frequency. Dr. Jordan LikesPool is her neurosurgeon.   Past Medical History:  Diagnosis Date  . Acute meniscal tear of knee    RIGHT  . Back pain   . Depression   . DJD (degenerative joint disease) of knee    RIGHT  . History of kidney stones   . History of pneumonia     Patient Active Problem List   Diagnosis Date Noted  . Cholelithiasis 09/14/2015  . Pneumonia 09/14/2015  . Emesis   . Community acquired pneumonia 09/13/2015  . 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 Body mass index (BMI) of 31.0-31.9 in adult 06/26/2014    Past Surgical History:  Procedure Laterality Date  . BACK SURGERY    . ECTOPIC PREGNANCY SURGERY  1991  . KNEE ARTHROSCOPY WITH MEDIAL MENISECTOMY Right 11/26/2012   Procedure: KNEE ARTHROSCOPY WITH PARTIAL MEDIAL MENISECTOMY,DEBRIDEMENT;  Surgeon: Javier DockerJeffrey C Beane, MD;  [facility] [place];  Service: Orthopedics;   Laterality: Right;  . LUMBAR FUSION  03-20-2010   L4 -- L5    OB History    No data available       Home Medications    Prior to Admission medications   Medication Sig Start Date End Date Taking? Authorizing Provider  acetaminophen (TYLENOL) 500 MG tablet Take 1,000 mg by mouth 2 (two) times daily as needed for mild pain or headache.     Historical Provider, MD  amitriptyline (ELAVIL) 50 MG tablet Take 50 mg by mouth at bedtime as needed for sleep.    Historical Provider, MD  benzonatate (TESSALON) 100 MG capsule Take 1 capsule (100 mg total) by mouth 3 (three) times daily. 09/16/15   Joseph ArtJessica U Vann, DO  EPINEPHrine (EPIPEN 2-PAK) 0.3 mg/0.3 mL IJ SOAJ injection Inject 0.3 mLs (0.3 mg total) into the muscle once as needed (for severe allergic reaction). CAll 911 immediately if you have to use this medicine 02/22/15   Francee PiccoloJennifer Piepenbrink, PA-C  famotidine (PEPCID) 20 MG tablet Take 1 tablet (20 mg total) by mouth 2 (two) times daily. 02/22/15   Jennifer Piepenbrink, PA-C  gabapentin (NEURONTIN) 600 MG tablet Take 600 mg by mouth 2 (two) times daily.    Historical Provider, MD  guaiFENesin (MUCINEX) 600 MG 12 hr tablet Take 2 tablets (1,200 mg total) by mouth 2 (two) times daily. 09/16/15   Joseph ArtJessica U Vann, DO  methocarbamol (ROBAXIN) 500  MG tablet Take 2 tablets (1,000 mg total) by mouth 4 (four) times daily as needed (Pain). 04/26/16   Maikel Neisler, PA-C  olopatadine (PATANOL) 0.1 % ophthalmic solution Place 1 drop into both eyes 2 (two) times daily. 03/07/16   Linna Hoff, MD  Oxycodone HCl 10 MG TABS Take 10 mg by mouth every 4 (four) hours as needed (for pain).  09/26/15   Historical Provider, MD  permethrin (ELIMITE) 5 % cream Use as instructed on bottle, apply tonight , wash off in am. 03/07/16   Linna Hoff, MD  sodium chloride (OCEAN) 0.65 % SOLN nasal spray Place 1 spray into both nostrils as needed for congestion. 09/16/15   Joseph Art, DO    Family History Family History    Problem Relation Age of Onset  . Diabetes Mellitus II Mother   . Hypertension Mother     Social History Social History  Substance Use Topics  . Smoking status: Current Some Day Smoker    Packs/day: 0.00    Years: 34.00    Types: Cigarettes  . Smokeless tobacco: Never Used     Comment: smokes 1 cig daily  . Alcohol use No     Allergies   Bee venom; Aspirin; and Morphine and related   Review of Systems Review of Systems 10 systems reviewed and all are negative for acute change except as noted in the HPI.    Physical Exam Updated Vital Signs BP 100/85 (BP Location: Right Arm)   Pulse 76   Temp 98.1 F (36.7 C) (Oral)   Resp 16   LMP 04/26/2012   SpO2 99%   Physical Exam  Constitutional: She appears well-developed and well-nourished.  HENT:  Head: Normocephalic.  Eyes: Conjunctivae are normal.  Neck: Normal range of motion.  Cardiovascular: Normal rate, regular rhythm and intact distal pulses.   Pulmonary/Chest: Effort normal.  Abdominal: Soft. There is no tenderness.  Neurological: She is alert.  No point tenderness to percussion of lumbar spinal processes.  No TTP or paraspinal muscular spasm. Strength is 5 out of 5 to bilateral lower extremities at hip and knee; extensor hallucis longus 5 out of 5. Ankle strength 5 out of 5, no clonus, neurovascularly intact. No saddle anaesthesia. Patellar reflexes are 2+ bilaterally.    Ambulates with a coordinated but slightly antalgic gait.  Psychiatric: She has a normal mood and affect.  Nursing note and vitals reviewed.    ED Treatments / Results  DIAGNOSTIC STUDIES:  Oxygen Saturation is 97% on RA, normal by my interpretation.    COORDINATION OF CARE:  7:38 PM Discussed treatment plan with pt at bedside and pt agreed to plan.  Labs (all labs ordered are listed, but only abnormal results are displayed) Labs Reviewed - No data to display  EKG  EKG Interpretation None       Radiology No results  found.  Procedures Procedures (including critical care time)  Medications Ordered in ED Medications  oxyCODONE-acetaminophen (PERCOCET/ROXICET) 5-325 MG per tablet 2 tablet (2 tablets Oral Given 04/26/16 1942)  methocarbamol (ROBAXIN) tablet 500 mg (500 mg Oral Given 04/26/16 1942)     Initial Impression / Assessment and Plan / ED Course  I have reviewed the triage vital signs and the nursing notes.  Pertinent labs & imaging results that were available during my care of the patient were reviewed by me and considered in my medical decision making (see chart for details).  Clinical Course    Vitals:  04/26/16 1918 04/26/16 2022  BP: 133/82 100/85  Pulse: 88 76  Resp: 14 16  Temp: 98.1 F (36.7 C)   TempSrc: Oral   SpO2: 97% 99%    Medications  oxyCODONE-acetaminophen (PERCOCET/ROXICET) 5-325 MG per tablet 2 tablet (2 tablets Oral Given 04/26/16 1942)  methocarbamol (ROBAXIN) tablet 500 mg (500 mg Oral Given 04/26/16 1942)    Martha Buck is 55 y.o. female presenting with Exacerbation of her chronic low back pain. Neurologic exam is nonfocal, she sees neurosurgeon Dr. Dutch Quint, I've asked her to follow closely with him. Patient is given Percocet in the ED and prescription for Robaxin to go home.   Evaluation does not show pathology that would require ongoing emergent intervention or inpatient treatment. Pt is hemodynamically stable and mentating appropriately. Discussed findings and plan with patient/guardian, who agrees with care plan. All questions answered. Return precautions discussed and outpatient follow up given.    Final Clinical Impressions(s) / ED Diagnoses   Final diagnoses:  Acute exacerbation of chronic low back pain    New Prescriptions Discharge Medication List as of 04/26/2016  8:19 PM    START taking these medications   Details  methocarbamol (ROBAXIN) 500 MG tablet Take 2 tablets (1,000 mg total) by mouth 4 (four) times daily as needed (Pain)., Starting Sat  04/26/2016, Print       I personally performed the services described in this documentation, which was scribed in my presence. The recorded information has been reviewed and is accurate.    Joni Reining Muhanad Torosyan, PA-C 04/26/16 1610    Margarita Grizzle, MD 04/29/16 [phone]

## 2016-04-26 NOTE — ED Triage Notes (Signed)
Pt c/o lower back pain shooting down both legs. Pt states she was a funeral today when onset of pain started. Pt has hx lower back pain. Pt denies any loose of bowel or bladder.

## 2016-04-26 NOTE — Discharge Instructions (Signed)
Please follow with your primary care doctor in the next 2 days for a check-up. They must obtain records for further management.  ° °Do not hesitate to return to the Emergency Department for any new, worsening or concerning symptoms.  ° °

## 2016-05-05 ENCOUNTER — Other Ambulatory Visit: Payer: Self-pay | Admitting: Neurosurgery

## 2016-05-05 DIAGNOSIS — M5137 Other intervertebral disc degeneration, lumbosacral region: Secondary | ICD-10-CM

## 2016-05-14 ENCOUNTER — Ambulatory Visit
Admission: RE | Admit: 2016-05-14 | Discharge: 2016-05-14 | Disposition: A | Discharge status: Home / Self Care | Payer: Worker's Compensation | Source: Ambulatory Visit | Attending: Neurosurgery | Admitting: Neurosurgery

## 2016-05-14 DIAGNOSIS — M5137 Other intervertebral disc degeneration, lumbosacral region: Secondary | ICD-10-CM

## 2016-07-07 ENCOUNTER — Other Ambulatory Visit: Payer: Self-pay | Admitting: Specialist

## 2016-07-07 DIAGNOSIS — Z1231 Encounter for screening mammogram for malignant neoplasm of breast: Secondary | ICD-10-CM

## 2016-07-30 ENCOUNTER — Ambulatory Visit: Payer: Self-pay

## 2016-10-20 DIAGNOSIS — M1711 Unilateral primary osteoarthritis, right knee: Secondary | ICD-10-CM

## 2016-10-20 DIAGNOSIS — Z981 Arthrodesis status: Secondary | ICD-10-CM

## 2016-10-20 NOTE — H&P (Signed)
PREOPERATIVE H&P [name] 56 y.o.  Chief Complaint: OA RIGHT KNEE  Planned Procedure Date: 11/11/16 Medical Clearance by Dr. Midge AverPavelock pending   HPI: Martha Buck is a 56 y.o. female with a history of lumbar fusion and chronic pain on oxycodone and neurontin who presents for evaluation of OA RIGHT KNEE. The patient has a history of pain and functional disability in the right knee due to arthritis and has failed non-surgical conservative treatments for greater than 12 weeks to include NSAID's and/or analgesics, corticosteriod injections, viscosupplementation injections and activity modification.  Onset of symptoms was gradual, starting 6 years ago with gradually worsening course since that time. The patient noted previous arthroscopy on the right knee by [place].  Patient currently rates pain at 10 out of 10 with activity. Patient has night pain, worsening of pain with activity and weight bearing and pain that interferes with activities of daily living.  Patient has evidence of periarticular osteophytes and joint space narrowing by XR.  MRI shows areas of Grade 3-4 changes in the Medial and PF joint space. There is no active infection.  Past Medical History:  Diagnosis Date  . Acute meniscal tear of knee    RIGHT  . Back pain   . Depression   . DJD (degenerative joint disease) of knee    RIGHT  . History of kidney stones   . History of pneumonia    Past Surgical History:  Procedure Laterality Date  . BACK SURGERY    . ECTOPIC PREGNANCY SURGERY  1991  . KNEE ARTHROSCOPY WITH MEDIAL MENISECTOMY Right 11/26/2012   Procedure: KNEE ARTHROSCOPY WITH PARTIAL MEDIAL MENISECTOMY,DEBRIDEMENT;  Surgeon: Javier DockerJeffrey C Beane, MD;  [facility] [place];  Service: Orthopedics;  Laterality: Right;  . LUMBAR FUSION  03-20-2010   L4 -- L5   Allergies  Allergen Reactions  . Bee Venom Anaphylaxis  . Aspirin Itching  . Morphine And Related  Itching   Prior to Admission medications   Medication Sig Start Date End Date Taking? Authorizing Provider  acetaminophen (TYLENOL) 500 MG tablet Take 1,000 mg by mouth 2 (two) times daily as needed for mild pain or headache.     Historical Provider, MD  amitriptyline (ELAVIL) 50 MG tablet Take 50 mg by mouth at bedtime as needed for sleep.    Historical Provider, MD  benzonatate (TESSALON) 100 MG capsule Take 1 capsule (100 mg total) by mouth 3 (three) times daily. 09/16/15   Joseph ArtJessica U Vann, DO  EPINEPHrine (EPIPEN 2-PAK) 0.3 mg/0.3 mL IJ SOAJ injection Inject 0.3 mLs (0.3 mg total) into the muscle once as needed (for severe allergic reaction). CAll 911 immediately if you have to use this medicine 02/22/15   Francee PiccoloJennifer Piepenbrink, PA-C  famotidine (PEPCID) 20 MG tablet Take 1 tablet (20 mg total) by mouth 2 (two) times daily. 02/22/15   Jennifer Piepenbrink, PA-C  gabapentin (NEURONTIN) 600 MG tablet Take 600 mg by mouth 2 (two) times daily.    Historical Provider, MD  guaiFENesin (MUCINEX) 600 MG 12 hr tablet Take 2 tablets (1,200 mg total) by mouth 2 (two) times daily. 09/16/15   Joseph ArtJessica U Vann, DO  methocarbamol (ROBAXIN) 500 MG tablet Take 2 tablets (1,000 mg total) by mouth 4 (four) times daily as needed (Pain). 04/26/16   Nicole Pisciotta, PA-C  olopatadine (PATANOL) 0.1 % ophthalmic solution Place 1 drop into both eyes 2 (two) times daily. 03/07/16   Linna HoffJames D Kindl, MD  Oxycodone  HCl 10 MG TABS Take 10 mg by mouth every 4 (four) hours as needed (for pain).  09/26/15   Historical Provider, MD  permethrin (ELIMITE) 5 % cream Use as instructed on bottle, apply tonight , wash off in am. 03/07/16   Linna Hoff, MD  sodium chloride (OCEAN) 0.65 % SOLN nasal spray Place 1 spray into both nostrils as needed for congestion. 09/16/15   Joseph Art, DO   Social History   Social History  . Marital status: Divorced    Spouse name: N/A  . Number of children: N/A  . Years of education: N/A   Social History  Main Topics  . Smoking status: Current Some Day Smoker    Packs/day: 0.00    Years: 34.00    Types: Cigarettes  . Smokeless tobacco: Never Used     Comment: smokes 1 cig daily  . Alcohol use No  . Drug use: No  . Sexual activity: Not on file   Other Topics Concern  . Not on file   Social History Narrative  . No narrative on file   Family History  Problem Relation Age of Onset  . Diabetes Mellitus II Mother   . Hypertension Mother     ROS: Currently denies lightheadedness, dizziness, Fever, chills, CP, SOB.   No personal history of DVT, PE, MI, or CVA. No loose teeth or dentures All other systems have been reviewed and were otherwise currently negative with the exception of those mentioned in the HPI and as above.  Objective: Vitals: Ht: 5'9" Wt: 199.6 Temp: 97.9 BP: 105/73 Pulse: 77 O2 96% on room air. Physical Exam: General: Alert, NAD.  Antalgic Gait - utilizes rolling walker. HEENT: EOMI, Good Neck Extension  Pulm: No increased work of breathing.  Clear B/L A/P w/o crackle or wheeze.  CV: RRR, No m/g/r appreciated  GI: soft, NT, ND Neuro: Neuro without gross focal deficit.  Sensation intact distally Skin: No lesions in the area of chief complaint MSK/Surgical Site: She has joint line tenderness mostly on the medial, but some peripatellar tenderness as well.  Flexion contracture of 2-5 degrees. 2.5-90 degrees.  Imaging Review Plain radiographs and MRI demonstrate severe degenerative joint disease with areas of grade 3-4 changes in the right knee.  Most significant medial / patella-femoral.   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 The treatment options including medical management, injection therapy, and arthroplasty were discussed at length. The risks and benefits of Procedure(s): TOTAL KNEE ARTHROPLASTY were presented and reviewed.  The risks of nonoperative treatment, versus surgical intervention including but not limited to continued pain, aseptic loosening, stiffness, dislocation/subluxation, infection, bleeding, nerve injury, blood clots, cardiopulmonary complications, morbidity, mortality, among others were discussed. The patient verbalizes understanding and wishes to proceed with the plan.  Patient is being admitted for inpatient treatment for surgery, pain control, PT, OT, prophylactic antibiotics, VTE prophylaxis, progressive ambulation, ADL's and discharge planning.   Dental prophylaxis discussed and recommended for 2 years postoperatively.  The patient does meet the criteria for TXA which will be used perioperatively via IV.   Xarelto  will be used postoperatively for DVT prophylaxis  in addition to SCDs, and early ambulation.  ASA reaction - rash. The patient is planning to be discharged home with home health services ([facility]) in care of her Sister Idelle Crouch.  Also will be followed by her daughters. IV Dilaudid if needed rather than Morphine dt itching/rash. Plan to resume home pain medications as prescribed and add PO medicine to supplement if needed.  Albina Billet III, PA-C 10/20/2016 2:02 PM

## 2016-10-30 NOTE — Pre-Procedure Instructions (Signed)
Martha Buck  10/30/2016      [facility] - [place], [place] - [address] [place] [address] Phone: [phone] Fax: [phone]    Your procedure is scheduled on Tues, Mar 27 @ 12:00 PM  Report to [facility] Admitting at 10:00 AM  Call this number if you have problems the morning of surgery:  [phone]   Remember:  Do not eat food or drink liquids after midnight.  Take these medicines the morning of surgery with A SIP OF WATER Gabapentin(Neurontin) and Pain Pill(if needed)             No Goody's,BC's,Aleve,Advil,Motrin,Ibuprofen,Fish Oil,or any Herbal Medications.    Do not wear jewelry, make-up or nail polish.  Do not wear lotions, powders, perfumes, or deoderant.  Do not shave 48 hours prior to surgery.    Do not bring valuables to the hospital.  [facility] is not responsible for any belongings or valuables.  Contacts, dentures or bridgework may not be worn into surgery.  Leave your suitcase in the car.  After surgery it may be brought to your room.  For patients admitted to the hospital, discharge time will be determined by your treatment team.  Patients discharged the day of surgery will not be allowed to drive home.    [facility]  Before surgery, you can play an important role.  Because skin is not sterile, your skin needs to be as free of germs as possible.  You can reduce the number of germs on you skin by washing with CHG (chlorahexidine gluconate) soap before surgery.  CHG is an antiseptic cleaner which kills germs and bonds with the skin to continue killing germs even after washing.  Please DO NOT use if you have an allergy to CHG or antibacterial soaps.  If your skin becomes reddened/irritated stop using the CHG and inform your nurse when you arrive at Short Stay.  Do not shave (including legs and underarms) for at least 48 hours prior to  the first CHG shower.  You may shave your face.  Please follow these instructions carefully:   1.  Shower with CHG Soap the night before surgery and the                                morning of Surgery.  2.  If you choose to wash your hair, wash your hair first as usual with your       normal shampoo.  3.  After you shampoo, rinse your hair and body thoroughly to remove the                      Shampoo.  4.  Use CHG as you would any other liquid soap.  You can apply chg directly       to the skin and wash gently with scrungie or a clean washcloth.  5.  Apply the CHG Soap to your body ONLY FROM THE NECK DOWN.        Do not use on open wounds or open sores.  Avoid contact with your eyes,       ears, mouth and genitals (private parts).  Wash genitals (private parts)       with your normal soap.  6.  Wash thoroughly, paying special attention to the area where your  surgery        will be performed.  7.  Thoroughly rinse your body with warm water from the neck down.  8.  DO NOT shower/wash with your normal soap after using and rinsing off       the CHG Soap.  9.  Pat yourself dry with a clean towel.            10.  Wear clean pajamas.            11.  Place clean sheets on your bed the night of your first shower and do not        sleep with pets.  Day of Surgery  Do not apply any lotions/deoderants the morning of surgery.  Please wear clean clothes to the hospital/surgery center.     Please read over the following fact sheets that you were given. Pain Booklet, Coughing and Deep Breathing, MRSA Information and Surgical Site Infection Prevention  [place] - Preparing for Surgery  Before surgery, you can play an important role.  Because skin is not sterile, your skin needs to be as free of germs as possible.  You can reduce the number of germs on you skin by washing with CHG (chlorahexidine gluconate) soap before surgery.  CHG is an antiseptic cleaner which kills germs and bonds with the skin to  continue killing germs even after washing.  Please DO NOT use if you have an allergy to CHG or antibacterial soaps.  If your skin becomes reddened/irritated stop using the CHG and inform your nurse when you arrive at Short Stay.  Do not shave (including legs and underarms) for at least 48 hours prior to the first CHG shower.  You may shave your face.  Please follow these instructions carefully:   1.  Shower with CHG Soap the night before surgery and the                                morning of Surgery.  2.  If you choose to wash your hair, wash your hair first as usual with your       normal shampoo.  3.  After you shampoo, rinse your hair and body thoroughly to remove the                      Shampoo.  4.  Use CHG as you would any other liquid soap.  You can apply chg directly       to the skin and wash gently with scrungie or a clean washcloth.  5.  Apply the CHG Soap to your body ONLY FROM THE NECK DOWN.        Do not use on open wounds or open sores.  Avoid contact with your eyes,       ears, mouth and genitals (private parts).  Wash genitals (private parts)       with your normal soap.  6.  Wash thoroughly, paying special attention to the area where your surgery        will be performed.  7.  Thoroughly rinse your body with warm water from the neck down.  8.  DO NOT shower/wash with your normal soap after using and rinsing off       the CHG Soap.  9.  Pat yourself dry with a clean towel.            10.  Wear clean pajamas.            11.  Place clean sheets on your bed the night of your first shower and do not        sleep with pets.  Day of Surgery  Do not apply any lotions/deoderants the morning of surgery.  Please wear clean clothes to the hospital/surgery center.

## 2016-10-31 ENCOUNTER — Encounter (HOSPITAL_COMMUNITY)
Admission: RE | Admit: 2016-10-31 | Discharge: 2016-10-31 | Disposition: A | Discharge status: Home / Self Care | Payer: Medicare Other | Source: Ambulatory Visit | Attending: Orthopedic Surgery | Admitting: Orthopedic Surgery

## 2016-10-31 ENCOUNTER — Encounter (HOSPITAL_COMMUNITY): Payer: Self-pay

## 2016-10-31 DIAGNOSIS — M1711 Unilateral primary osteoarthritis, right knee: Secondary | ICD-10-CM | POA: Diagnosis not present

## 2016-10-31 DIAGNOSIS — Z01812 Encounter for preprocedural laboratory examination: Secondary | ICD-10-CM | POA: Diagnosis present

## 2016-10-31 HISTORY — DX: Pain in unspecified joint: M25.50

## 2016-10-31 LAB — BASIC METABOLIC PANEL
Anion gap: 7 (ref 5–15)
BUN: 18 mg/dL (ref 6–20)
CHLORIDE: 108 mmol/L (ref 101–111)
CO2: 24 mmol/L (ref 22–32)
Calcium: 8.1 mg/dL — ABNORMAL LOW (ref 8.9–10.3)
Creatinine, Ser: 0.76 mg/dL (ref 0.44–1.00)
GFR calc Af Amer: >60 mL/min (ref >60)
Glucose, Bld: 93 mg/dL (ref 65–99)
POTASSIUM: 3.9 mmol/L (ref 3.5–5.1)
Sodium: 139 mmol/L (ref 135–145)

## 2016-10-31 LAB — SURGICAL PCR SCREEN
MRSA, PCR: NEGATIVE
STAPHYLOCOCCUS AUREUS: NEGATIVE

## 2016-10-31 LAB — CBC
HEMATOCRIT: 39.8 % (ref 36.0–46.0)
Hemoglobin: 12.8 g/dL (ref 12.0–15.0)
MCH: 30.4 pg (ref 26.0–34.0)
MCHC: 32.2 g/dL (ref 30.0–36.0)
MCV: 94.5 fL (ref 78.0–100.0)
Platelets: 271 10*3/uL (ref 150–400)
RBC: 4.21 MIL/uL (ref 3.87–5.11)
RDW: 14 % (ref 11.5–15.5)
WBC: 9.8 10*3/uL (ref 4.0–10.5)

## 2016-10-31 NOTE — Progress Notes (Addendum)
Cardiologist denies  Medical Md is Dr.Richard Pavelock  Echo 14 yrs ago  Stress test > 5 yrs ago  Heart cath denies  EKG denies in past yr  CXR denies in past yr

## 2016-11-11 ENCOUNTER — Inpatient Hospital Stay (HOSPITAL_COMMUNITY): Payer: Medicare Other

## 2016-11-11 ENCOUNTER — Inpatient Hospital Stay (HOSPITAL_COMMUNITY): Payer: Medicare Other | Admitting: Certified Registered Nurse Anesthetist

## 2016-11-11 ENCOUNTER — Encounter (HOSPITAL_COMMUNITY)
Admission: RE | Disposition: A | Discharge status: Home Health | Payer: Self-pay | Source: Ambulatory Visit | Attending: Orthopedic Surgery

## 2016-11-11 ENCOUNTER — Encounter (HOSPITAL_COMMUNITY): Payer: Self-pay | Admitting: Certified Registered Nurse Anesthetist

## 2016-11-11 ENCOUNTER — Inpatient Hospital Stay (HOSPITAL_COMMUNITY)
Admission: RE | Admit: 2016-11-11 | Discharge: 2016-11-13 | DRG: 470 | Disposition: A | Discharge status: Home Health | Payer: Medicare Other | Source: Ambulatory Visit | Attending: Orthopedic Surgery | Admitting: Orthopedic Surgery

## 2016-11-11 DIAGNOSIS — M4716 Other spondylosis with myelopathy, lumbar region: Secondary | ICD-10-CM | POA: Diagnosis present

## 2016-11-11 DIAGNOSIS — F329 Major depressive disorder, single episode, unspecified: Secondary | ICD-10-CM | POA: Diagnosis present

## 2016-11-11 DIAGNOSIS — R296 Repeated falls: Secondary | ICD-10-CM | POA: Diagnosis not present

## 2016-11-11 DIAGNOSIS — Z981 Arthrodesis status: Secondary | ICD-10-CM | POA: Diagnosis not present

## 2016-11-11 DIAGNOSIS — M1711 Unilateral primary osteoarthritis, right knee: Secondary | ICD-10-CM | POA: Diagnosis present

## 2016-11-11 DIAGNOSIS — F17210 Nicotine dependence, cigarettes, uncomplicated: Secondary | ICD-10-CM | POA: Diagnosis present

## 2016-11-11 DIAGNOSIS — Z87442 Personal history of urinary calculi: Secondary | ICD-10-CM

## 2016-11-11 DIAGNOSIS — Z888 Allergy status to other drugs, medicaments and biological substances status: Secondary | ICD-10-CM

## 2016-11-11 DIAGNOSIS — Z91030 Bee allergy status: Secondary | ICD-10-CM

## 2016-11-11 DIAGNOSIS — Z8701 Personal history of pneumonia (recurrent): Secondary | ICD-10-CM

## 2016-11-11 DIAGNOSIS — Z885 Allergy status to narcotic agent status: Secondary | ICD-10-CM

## 2016-11-11 DIAGNOSIS — G8929 Other chronic pain: Secondary | ICD-10-CM | POA: Diagnosis present

## 2016-11-11 HISTORY — PX: TOTAL KNEE ARTHROPLASTY: SHX125

## 2016-11-11 LAB — CBC WITH DIFFERENTIAL/PLATELET
BASOS ABS: 0 10*3/uL (ref 0.0–0.1)
Basophils Relative: 0 %
Eosinophils Absolute: 0.4 10*3/uL (ref 0.0–0.7)
Eosinophils Relative: 4 %
HEMATOCRIT: 36.9 % (ref 36.0–46.0)
Hemoglobin: 11.9 g/dL — ABNORMAL LOW (ref 12.0–15.0)
LYMPHS PCT: 31 %
Lymphs Abs: 2.8 10*3/uL (ref 0.7–4.0)
MCH: 30.5 pg (ref 26.0–34.0)
MCHC: 32.2 g/dL (ref 30.0–36.0)
MCV: 94.6 fL (ref 78.0–100.0)
MONO ABS: 0.6 10*3/uL (ref 0.1–1.0)
MONOS PCT: 6 %
NEUTROS ABS: 5.2 10*3/uL (ref 1.7–7.7)
Neutrophils Relative %: 59 %
PLATELETS: 179 10*3/uL (ref 150–400)
RBC: 3.9 MIL/uL (ref 3.87–5.11)
RDW: 14 % (ref 11.5–15.5)
WBC: 8.9 10*3/uL (ref 4.0–10.5)

## 2016-11-11 SURGERY — ARTHROPLASTY, KNEE, TOTAL
Anesthesia: Monitor Anesthesia Care | Laterality: Right

## 2016-11-11 MED ORDER — MIDAZOLAM HCL 2 MG/2ML IJ SOLN
INTRAMUSCULAR | Status: AC
  Filled 2016-11-11: qty 2

## 2016-11-11 MED ORDER — KETOROLAC TROMETHAMINE 30 MG/ML IJ SOLN
INTRAMUSCULAR | Status: DC | PRN
Start: 2016-11-11 — End: 2016-11-11
  Administered 2016-11-11: 30 mg

## 2016-11-11 MED ORDER — HYDROCODONE-ACETAMINOPHEN 10-325 MG PO TABS
1.0000 | ORAL_TABLET | Freq: Three times a day (TID) | ORAL | Status: DC
  Administered 2016-11-11 – 2016-11-13 (×7): 1 via ORAL
  Filled 2016-11-11 (×7): qty 1

## 2016-11-11 MED ORDER — HYDROMORPHONE HCL 1 MG/ML IJ SOLN
0.2500 mg | INTRAMUSCULAR | Status: DC | PRN
  Administered 2016-11-11 (×2): 0.5 mg via INTRAVENOUS

## 2016-11-11 MED ORDER — ACETAMINOPHEN 650 MG RE SUPP: 650.0000 mg | Freq: Four times a day (QID) | RECTAL | Status: DC | PRN

## 2016-11-11 MED ORDER — LACTATED RINGERS IV SOLN
INTRAVENOUS | Status: DC
  Administered 2016-11-11 (×2): via INTRAVENOUS

## 2016-11-11 MED ORDER — SORBITOL 70 % SOLN: 30.0000 mL | Freq: Every day | Status: DC | PRN

## 2016-11-11 MED ORDER — SENNA 8.6 MG PO TABS
1.0000 | ORAL_TABLET | Freq: Two times a day (BID) | ORAL | Status: DC
  Administered 2016-11-11 – 2016-11-13 (×4): 8.6 mg via ORAL
  Filled 2016-11-11 (×4): qty 1

## 2016-11-11 MED ORDER — KETOROLAC TROMETHAMINE 15 MG/ML IJ SOLN
15.0000 mg | Freq: Four times a day (QID) | INTRAMUSCULAR | Status: AC
  Administered 2016-11-11 – 2016-11-12 (×4): 15 mg via INTRAVENOUS
  Filled 2016-11-11 (×3): qty 1

## 2016-11-11 MED ORDER — PHENYLEPHRINE HCL 10 MG/ML IJ SOLN
INTRAMUSCULAR | Status: DC | PRN
  Administered 2016-11-11: 80 ug via INTRAVENOUS

## 2016-11-11 MED ORDER — DOCUSATE SODIUM 100 MG PO CAPS
100.0000 mg | ORAL_CAPSULE | Freq: Two times a day (BID) | ORAL | Status: DC
  Administered 2016-11-11 – 2016-11-13 (×4): 100 mg via ORAL
  Filled 2016-11-11 (×4): qty 1

## 2016-11-11 MED ORDER — PHENOL 1.4 % MT LIQD: 1.0000 | OROMUCOSAL | Status: DC | PRN

## 2016-11-11 MED ORDER — POLYETHYLENE GLYCOL 3350 17 G PO PACK: 17.0000 g | PACK | Freq: Every day | ORAL | Status: DC | PRN

## 2016-11-11 MED ORDER — ONDANSETRON HCL 4 MG PO TABS: 4.0000 mg | ORAL_TABLET | Freq: Three times a day (TID) | ORAL | 0 refills | Status: DC | PRN

## 2016-11-11 MED ORDER — PHENYLEPHRINE HCL 10 MG/ML IJ SOLN
INTRAVENOUS | Status: DC | PRN
  Administered 2016-11-11: 20 ug/min via INTRAVENOUS

## 2016-11-11 MED ORDER — CEFAZOLIN SODIUM-DEXTROSE 2-4 GM/100ML-% IV SOLN
INTRAVENOUS | Status: AC
  Filled 2016-11-11: qty 100

## 2016-11-11 MED ORDER — PROMETHAZINE HCL 25 MG/ML IJ SOLN: 6.2500 mg | INTRAMUSCULAR | Status: DC | PRN

## 2016-11-11 MED ORDER — ONDANSETRON HCL 4 MG PO TABS: 4.0000 mg | ORAL_TABLET | Freq: Four times a day (QID) | ORAL | Status: DC | PRN

## 2016-11-11 MED ORDER — METHOCARBAMOL 500 MG PO TABS
500.0000 mg | ORAL_TABLET | Freq: Four times a day (QID) | ORAL | Status: DC | PRN
  Administered 2016-11-11 – 2016-11-13 (×5): 500 mg via ORAL
  Filled 2016-11-11 (×4): qty 1

## 2016-11-11 MED ORDER — ACETAMINOPHEN 325 MG PO TABS: 650.0000 mg | ORAL_TABLET | Freq: Four times a day (QID) | ORAL | Status: DC | PRN

## 2016-11-11 MED ORDER — RIVAROXABAN 10 MG PO TABS
10.0000 mg | ORAL_TABLET | Freq: Every day | ORAL | Status: DC
  Administered 2016-11-12 – 2016-11-13 (×2): 10 mg via ORAL
  Filled 2016-11-11 (×2): qty 1

## 2016-11-11 MED ORDER — HYDROMORPHONE HCL 1 MG/ML IJ SOLN: 0.5000 mg | INTRAMUSCULAR | Status: DC | PRN

## 2016-11-11 MED ORDER — FENTANYL CITRATE (PF) 100 MCG/2ML IJ SOLN
50.0000 ug | Freq: Once | INTRAMUSCULAR | Status: AC
  Administered 2016-11-11: 50 ug via INTRAVENOUS

## 2016-11-11 MED ORDER — FENTANYL CITRATE (PF) 100 MCG/2ML IJ SOLN
INTRAMUSCULAR | Status: AC
  Filled 2016-11-11: qty 2

## 2016-11-11 MED ORDER — MIDAZOLAM HCL 2 MG/2ML IJ SOLN
2.0000 mg | Freq: Once | INTRAMUSCULAR | Status: AC
  Administered 2016-11-11: 2 mg via INTRAVENOUS

## 2016-11-11 MED ORDER — CHLORHEXIDINE GLUCONATE 4 % EX LIQD: 60.0000 mL | Freq: Once | CUTANEOUS | Status: DC

## 2016-11-11 MED ORDER — CELECOXIB 200 MG PO CAPS
200.0000 mg | ORAL_CAPSULE | Freq: Two times a day (BID) | ORAL | Status: AC
  Administered 2016-11-11 – 2016-11-12 (×3): 200 mg via ORAL
  Filled 2016-11-11 (×4): qty 1

## 2016-11-11 MED ORDER — OXYCODONE HCL 5 MG PO TABS
ORAL_TABLET | ORAL | Status: AC
  Filled 2016-11-11: qty 2

## 2016-11-11 MED ORDER — HYDROCODONE-ACETAMINOPHEN 5-325 MG PO TABS
ORAL_TABLET | ORAL | Status: AC
  Administered 2016-11-11: 2
  Filled 2016-11-11: qty 2

## 2016-11-11 MED ORDER — METHOCARBAMOL 500 MG PO TABS
ORAL_TABLET | ORAL | Status: AC
  Administered 2016-11-11: 500 mg via ORAL
  Filled 2016-11-11: qty 1

## 2016-11-11 MED ORDER — METOCLOPRAMIDE HCL 5 MG/ML IJ SOLN: 5.0000 mg | Freq: Three times a day (TID) | INTRAMUSCULAR | Status: DC | PRN

## 2016-11-11 MED ORDER — GABAPENTIN 400 MG PO CAPS
400.0000 mg | ORAL_CAPSULE | Freq: Three times a day (TID) | ORAL | Status: DC
  Administered 2016-11-11 – 2016-11-13 (×7): 400 mg via ORAL
  Filled 2016-11-11 (×8): qty 1

## 2016-11-11 MED ORDER — RIVAROXABAN 10 MG PO TABS: 10.0000 mg | ORAL_TABLET | Freq: Every day | ORAL | 0 refills | Status: DC

## 2016-11-11 MED ORDER — SODIUM CHLORIDE FLUSH 0.9 % IV SOLN
INTRAVENOUS | Status: DC | PRN
Start: 2016-11-11 — End: 2016-11-11
  Administered 2016-11-11 (×2): 10 mL

## 2016-11-11 MED ORDER — BUPIVACAINE IN DEXTROSE 0.75-8.25 % IT SOLN
INTRATHECAL | Status: DC | PRN
  Administered 2016-11-11: 2 mL via INTRATHECAL

## 2016-11-11 MED ORDER — BUPIVACAINE HCL (PF) 0.25 % IJ SOLN
INTRAMUSCULAR | Status: AC
  Filled 2016-11-11: qty 30

## 2016-11-11 MED ORDER — 0.9 % SODIUM CHLORIDE (POUR BTL) OPTIME
TOPICAL | Status: DC | PRN
  Administered 2016-11-11: 1000 mL

## 2016-11-11 MED ORDER — SODIUM CHLORIDE 0.9 % IR SOLN
Status: DC | PRN
  Administered 2016-11-11: 3000 mL

## 2016-11-11 MED ORDER — DEXTROSE-NACL 5-0.45 % IV SOLN: 100.0000 mL/h | INTRAVENOUS | Status: DC

## 2016-11-11 MED ORDER — HYDROMORPHONE HCL 1 MG/ML IJ SOLN
INTRAMUSCULAR | Status: AC
  Filled 2016-11-11: qty 0.5

## 2016-11-11 MED ORDER — MENTHOL 3 MG MT LOZG: 1.0000 | LOZENGE | OROMUCOSAL | Status: DC | PRN

## 2016-11-11 MED ORDER — MEPERIDINE HCL 25 MG/ML IJ SOLN: 6.2500 mg | INTRAMUSCULAR | Status: DC | PRN

## 2016-11-11 MED ORDER — METHOCARBAMOL 1000 MG/10ML IJ SOLN
500.0000 mg | Freq: Four times a day (QID) | INTRAVENOUS | Status: DC | PRN
  Filled 2016-11-11: qty 5

## 2016-11-11 MED ORDER — LACTATED RINGERS IV SOLN
INTRAVENOUS | Status: DC
  Administered 2016-11-11: 20:00:00 via INTRAVENOUS

## 2016-11-11 MED ORDER — DOCUSATE SODIUM 100 MG PO CAPS: 100.0000 mg | ORAL_CAPSULE | Freq: Two times a day (BID) | ORAL | 0 refills | Status: DC

## 2016-11-11 MED ORDER — TRANEXAMIC ACID 1000 MG/10ML IV SOLN
1000.0000 mg | INTRAVENOUS | Status: AC
  Administered 2016-11-11: 1000 mg via INTRAVENOUS
  Filled 2016-11-11: qty 10

## 2016-11-11 MED ORDER — ACETAMINOPHEN 325 MG PO TABS
650.0000 mg | ORAL_TABLET | Freq: Four times a day (QID) | ORAL | Status: AC
  Administered 2016-11-11 – 2016-11-12 (×2): 650 mg via ORAL
  Filled 2016-11-11 (×2): qty 2

## 2016-11-11 MED ORDER — OXYCODONE-ACETAMINOPHEN 5-325 MG PO TABS: 1.0000 | ORAL_TABLET | ORAL | 0 refills | Status: DC | PRN

## 2016-11-11 MED ORDER — EPHEDRINE SULFATE 50 MG/ML IJ SOLN
INTRAMUSCULAR | Status: DC | PRN
  Administered 2016-11-11 (×2): 5 mg via INTRAVENOUS

## 2016-11-11 MED ORDER — CHLORHEXIDINE GLUCONATE 0.12 % MT SOLN
15.0000 mL | Freq: Two times a day (BID) | OROMUCOSAL | Status: DC
  Administered 2016-11-11 – 2016-11-13 (×4): 15 mL via OROMUCOSAL
  Filled 2016-11-11 (×4): qty 15

## 2016-11-11 MED ORDER — METOCLOPRAMIDE HCL 5 MG PO TABS: 5.0000 mg | ORAL_TABLET | Freq: Three times a day (TID) | ORAL | Status: DC | PRN

## 2016-11-11 MED ORDER — ACETAMINOPHEN 500 MG PO TABS
1000.0000 mg | ORAL_TABLET | Freq: Once | ORAL | Status: AC
  Administered 2016-11-11: 1000 mg via ORAL
  Filled 2016-11-11: qty 2

## 2016-11-11 MED ORDER — KETOROLAC TROMETHAMINE 15 MG/ML IJ SOLN
INTRAMUSCULAR | Status: AC
  Administered 2016-11-11: 15 mg via INTRAVENOUS
  Filled 2016-11-11: qty 1

## 2016-11-11 MED ORDER — METHOCARBAMOL 500 MG PO TABS: 500.0000 mg | ORAL_TABLET | Freq: Four times a day (QID) | ORAL | 0 refills | Status: DC | PRN

## 2016-11-11 MED ORDER — PROPOFOL 500 MG/50ML IV EMUL
INTRAVENOUS | Status: DC | PRN
  Administered 2016-11-11: 75 ug/kg/min via INTRAVENOUS

## 2016-11-11 MED ORDER — OXYCODONE HCL 5 MG PO TABS
5.0000 mg | ORAL_TABLET | ORAL | Status: DC | PRN
  Administered 2016-11-11 – 2016-11-13 (×6): 10 mg via ORAL
  Filled 2016-11-11 (×5): qty 2

## 2016-11-11 MED ORDER — ONDANSETRON HCL 4 MG/2ML IJ SOLN: 4.0000 mg | Freq: Four times a day (QID) | INTRAMUSCULAR | Status: DC | PRN

## 2016-11-11 MED ORDER — HYDROMORPHONE HCL 1 MG/ML IJ SOLN
INTRAMUSCULAR | Status: AC
  Administered 2016-11-11: 0.5 mg via INTRAVENOUS
  Filled 2016-11-11: qty 0.5

## 2016-11-11 MED ORDER — HYDROMORPHONE HCL 2 MG/ML IJ SOLN
0.5000 mg | INTRAMUSCULAR | Status: DC | PRN
  Administered 2016-11-12 – 2016-11-13 (×2): 0.5 mg via INTRAVENOUS
  Filled 2016-11-11 (×2): qty 1

## 2016-11-11 MED ORDER — DEXAMETHASONE SODIUM PHOSPHATE 10 MG/ML IJ SOLN
10.0000 mg | Freq: Once | INTRAMUSCULAR | Status: AC
  Administered 2016-11-12: 10 mg via INTRAVENOUS
  Filled 2016-11-11: qty 1

## 2016-11-11 MED ORDER — BUPIVACAINE HCL (PF) 0.25 % IJ SOLN
INTRAMUSCULAR | Status: DC | PRN
  Administered 2016-11-11: 30 mL

## 2016-11-11 MED ORDER — ROPIVACAINE HCL 5 MG/ML IJ SOLN
INTRAMUSCULAR | Status: DC | PRN
  Administered 2016-11-11: 25 mL via PERINEURAL

## 2016-11-11 MED ORDER — PROPOFOL 10 MG/ML IV BOLUS
INTRAVENOUS | Status: DC | PRN
  Administered 2016-11-11: 50 mg via INTRAVENOUS

## 2016-11-11 MED ORDER — PREGABALIN 100 MG PO CAPS: 100.0000 mg | ORAL_CAPSULE | Freq: Once | ORAL | Status: DC

## 2016-11-11 MED ORDER — DIPHENHYDRAMINE HCL 12.5 MG/5ML PO ELIX
12.5000 mg | ORAL_SOLUTION | ORAL | Status: DC | PRN
  Administered 2016-11-12 – 2016-11-13 (×3): 25 mg via ORAL
  Filled 2016-11-11 (×3): qty 10

## 2016-11-11 MED ORDER — CEFAZOLIN SODIUM-DEXTROSE 2-4 GM/100ML-% IV SOLN
2.0000 g | INTRAVENOUS | Status: AC
  Administered 2016-11-11: 2 g via INTRAVENOUS
  Filled 2016-11-11: qty 100

## 2016-11-11 MED ORDER — LACTATED RINGERS IV SOLN: INTRAVENOUS | Status: DC

## 2016-11-11 SURGICAL SUPPLY — 60 items
BANDAGE ACE 6X5 VEL STRL LF (GAUZE/BANDAGES/DRESSINGS) ×3 IMPLANT
BANDAGE ESMARK 6X9 LF (GAUZE/BANDAGES/DRESSINGS) ×1 IMPLANT
BLADE SAG 18X100X1.27 (BLADE) ×3 IMPLANT
BNDG COHESIVE 6X5 TAN STRL LF (GAUZE/BANDAGES/DRESSINGS) ×3 IMPLANT
BNDG ESMARK 6X9 LF (GAUZE/BANDAGES/DRESSINGS) ×3
BOWL SMART MIX CTS (DISPOSABLE) ×3 IMPLANT
CAPT KNEE TRIATH TK-4 ×3 IMPLANT
CLOSURE STERI-STRIP 1/2X4 (GAUZE/BANDAGES/DRESSINGS) ×1
CLSR STERI-STRIP ANTIMIC 1/2X4 (GAUZE/BANDAGES/DRESSINGS) ×2 IMPLANT
COVER SURGICAL LIGHT HANDLE (MISCELLANEOUS) ×3 IMPLANT
CUFF TOURNIQUET SINGLE 34IN LL (TOURNIQUET CUFF) ×3 IMPLANT
DERMABOND ADVANCED (GAUZE/BANDAGES/DRESSINGS)
DERMABOND ADVANCED .7 DNX12 (GAUZE/BANDAGES/DRESSINGS) IMPLANT
DRAPE HALF SHEET 40X57 (DRAPES) ×3 IMPLANT
DRAPE U-SHAPE 47X51 STRL (DRAPES) ×3 IMPLANT
DRSG ADAPTIC 3X8 NADH LF (GAUZE/BANDAGES/DRESSINGS) ×3 IMPLANT
DRSG MEPILEX BORDER 4X8 (GAUZE/BANDAGES/DRESSINGS) ×3 IMPLANT
DURAPREP 26ML APPLICATOR (WOUND CARE) ×6 IMPLANT
ELECT CAUTERY BLADE 6.4 (BLADE) ×3 IMPLANT
ELECT REM PT RETURN 9FT ADLT (ELECTROSURGICAL) ×3
ELECTRODE REM PT RTRN 9FT ADLT (ELECTROSURGICAL) ×1 IMPLANT
FACESHIELD WRAPAROUND (MASK) ×6 IMPLANT
GAUZE SPONGE 4X4 12PLY STRL (GAUZE/BANDAGES/DRESSINGS) ×3 IMPLANT
GLOVE BIO SURGEON STRL SZ7.5 (GLOVE) ×6 IMPLANT
GLOVE BIOGEL PI IND STRL 8 (GLOVE) ×2 IMPLANT
GLOVE BIOGEL PI INDICATOR 8 (GLOVE) ×4
GLOVE ORTHO TXT STRL SZ7.5 (GLOVE) ×3 IMPLANT
GLOVE SURG SS PI 7.0 STRL IVOR (GLOVE) ×3 IMPLANT
GOWN STRL REUS W/ TWL LRG LVL3 (GOWN DISPOSABLE) ×2 IMPLANT
GOWN STRL REUS W/ TWL XL LVL3 (GOWN DISPOSABLE) ×1 IMPLANT
GOWN STRL REUS W/TWL LRG LVL3 (GOWN DISPOSABLE) ×4
GOWN STRL REUS W/TWL XL LVL3 (GOWN DISPOSABLE) ×2
HANDPIECE INTERPULSE COAX TIP (DISPOSABLE)
IMMOBILIZER KNEE 22 UNIV (SOFTGOODS) ×3 IMPLANT
IMMOBILIZER KNEE 24 THIGH 36 (MISCELLANEOUS) IMPLANT
IMMOBILIZER KNEE 24 UNIV (MISCELLANEOUS)
KIT BASIN OR (CUSTOM PROCEDURE TRAY) ×3 IMPLANT
KIT ROOM TURNOVER OR (KITS) ×3 IMPLANT
MANIFOLD NEPTUNE II (INSTRUMENTS) ×3 IMPLANT
NEEDLE 18GX1X1/2 (RX/OR ONLY) (NEEDLE) ×6 IMPLANT
NS IRRIG 1000ML POUR BTL (IV SOLUTION) ×3 IMPLANT
PACK TOTAL JOINT (CUSTOM PROCEDURE TRAY) ×3 IMPLANT
PACK UNIVERSAL I (CUSTOM PROCEDURE TRAY) ×3 IMPLANT
PAD ARMBOARD 7.5X6 YLW CONV (MISCELLANEOUS) ×3 IMPLANT
SET HNDPC FAN SPRY TIP SCT (DISPOSABLE) IMPLANT
STAPLER VISISTAT 35W (STAPLE) IMPLANT
SUCTION FRAZIER HANDLE 10FR (MISCELLANEOUS) ×2
SUCTION TUBE FRAZIER 10FR DISP (MISCELLANEOUS) ×1 IMPLANT
SUT MNCRL AB 4-0 PS2 18 (SUTURE) ×3 IMPLANT
SUT MON AB 2-0 CT1 27 (SUTURE) ×6 IMPLANT
SUT VIC AB 0 CT1 27 (SUTURE) ×2
SUT VIC AB 0 CT1 27XBRD ANBCTR (SUTURE) ×1 IMPLANT
SUT VIC AB 1 CT1 27 (SUTURE) ×4
SUT VIC AB 1 CT1 27XBRD ANBCTR (SUTURE) ×2 IMPLANT
SYR 50ML LL SCALE MARK (SYRINGE) ×3 IMPLANT
SYR TB 1ML LUER SLIP (SYRINGE) ×2 IMPLANT
TOWEL OR 17X24 6PK STRL BLUE (TOWEL DISPOSABLE) ×3 IMPLANT
TOWEL OR 17X26 10 PK STRL BLUE (TOWEL DISPOSABLE) ×3 IMPLANT
TRAY CATH 16FR W/PLASTIC CATH (SET/KITS/TRAYS/PACK) IMPLANT
TRAY FOLEY BAG SILVER LF 14FR (CATHETERS) IMPLANT

## 2016-11-11 NOTE — Anesthesia Procedure Notes (Signed)
Spinal  Patient location during procedure: OR Start time: 11/11/2016 10:55 AM End time: 11/11/2016 10:58 AM Staffing Anesthesiologist: Suella Broad D Performed: anesthesiologist  Preanesthetic Checklist Completed: patient identified, site marked, surgical consent, pre-op evaluation, timeout performed, IV checked, risks and benefits discussed and monitors and equipment checked Spinal Block Patient position: sitting Prep: Betadine Patient monitoring: heart rate, continuous pulse ox, blood pressure and cardiac monitor Approach: midline Location: L4-5 Injection technique: single-shot Needle Needle type: Whitacre and Introducer  Needle gauge: 24 G Needle length: 9 cm Additional Notes Negative paresthesia. Negative blood return. Positive free-flowing CSF. Expiration date of kit checked and confirmed. Patient tolerated procedure well, without complications.

## 2016-11-11 NOTE — Transfer of Care (Signed)
Immediate Anesthesia Transfer of Care Note  Patient: Martha Buck  Procedure(s) Performed: Procedure(s): TOTAL KNEE ARTHROPLASTY (Right)  Patient Location: PACU  Anesthesia Type:MAC, Regional and Spinal  Level of Consciousness: awake, alert , oriented and patient cooperative  Airway & Oxygen Therapy: Patient Spontanous Breathing and Patient connected to nasal cannula oxygen  Post-op Assessment: Report given to RN and Post -op Vital signs reviewed and stable  Post vital signs: Reviewed and stable  First BP 84/57, pulse 57; 84m ephedrine given.  Repeat BP 104/68.   Last Vitals:  Vitals:   11/11/16 1020 11/11/16 1255  BP:  (!) 84/57  Pulse: 63 67  Resp: 14 10  Temp:  (!) 36 C    Last Pain:  Vitals:   11/11/16 0913  TempSrc: Oral      Patients Stated Pain Goal: 2 [id number]  Complications: No apparent anesthesia complications

## 2016-11-11 NOTE — Anesthesia Procedure Notes (Signed)
Anesthesia Regional Block: Adductor canal block   Pre-Anesthetic Checklist: ,, timeout performed, Correct Patient, Correct Site, Correct Laterality, Correct Procedure, Correct Position, site marked, Risks and benefits discussed,  Surgical consent,  Pre-op evaluation,  At surgeon's request and post-op pain management  Laterality: Right  Prep: chloraprep       Needles:  Injection technique: Single-shot  Needle Type: Echogenic Needle     Needle Length: 9cm  Needle Gauge: 21     Additional Needles:   Procedures: ultrasound guided,,,,,,,,  Narrative:  Start time: 11/11/2016 9:55 AM End time: 11/11/2016 10:00 AM Injection made incrementally with aspirations every 5 mL.  Performed by: Personally  Anesthesiologist: Shona SimpsonHOLLIS, Amir Fick D  Additional Notes: Tolerated well.

## 2016-11-11 NOTE — Op Note (Signed)
DATE OF SURGERY:  11/11/2016 TIME: 12:11 PM  PATIENT NAME:  Martha Buck   AGE: 56 y.o.    PRE-OPERATIVE DIAGNOSIS:  OA RIGHT KNEE  POST-OPERATIVE DIAGNOSIS:  Same  PROCEDURE:  Procedure(s): TOTAL KNEE ARTHROPLASTY   SURGEON:  Shira Bobst D, MD   ASSISTANT:  Aquilla Hacker, PA-C, he was present and scrubbed throughout the case, critical for completion in a timely fashion, and for retraction, instrumentation, and closure.    OPERATIVE IMPLANTS: Stryker Triathlon Posterior Stabilized. Press fit knee  Femur size 4, Tibia size 4, Patella size 29 3-peg oval button, with a 11 mm polyethylene insert.   PREOPERATIVE INDICATIONS:  Martha Buck is a 56 y.o. year old female with end stage bone on bone degenerative arthritis of the knee who failed conservative treatment, including injections, antiinflammatories, activity modification, and assistive devices, and had significant impairment of their activities of daily living, and elected for Total Knee Arthroplasty.   The risks, benefits, and alternatives were discussed at length including but not limited to the risks of infection, bleeding, nerve injury, stiffness, blood clots, the need for revision surgery, cardiopulmonary complications, among others, and they were willing to proceed.   OPERATIVE DESCRIPTION:  The patient was brought to the operative room and placed in a supine position.  General anesthesia was administered.  IV antibiotics were given.  The lower extremity was prepped and draped in the usual sterile fashion.  Time out was performed.  The leg was elevated and exsanguinated and the tourniquet was inflated.  Anterior approach was performed.  The patella was everted and osteophytes were removed.  The anterior horn of the medial and lateral meniscus was removed.   The distal femur was opened with the drill and the intramedullary distal femoral cutting jig was utilized, set at 5 degrees resecting 8 mm off the distal  femur.  Care was taken to protect the collateral ligaments.  The distal femoral sizing jig was applied, taking care to avoid notching.  Then the 4-in-1 cutting jig was applied and the anterior and posterior femur was cut, along with the chamfer cuts.  All posterior osteophytes were removed.  The flexion gap was then measured and was symmetric with the extension gap.  Then the extramedullary tibial cutting jig was utilized making the appropriate cut using the anterior tibial crest as a reference building in appropriate posterior slope.  Care was taken during the cut to protect the medial and collateral ligaments.  The proximal tibia was removed along with the posterior horns of the menisci.  The PCL was sacrificed.    The extensor gap was measured and was approximately 9mm.    I completed the distal femoral preparation using the appropriate jig to prepare the box.  The patella was then measured, and cut with the saw.    The proximal tibia sized and prepared accordingly with the reamer and the punch, and then all components were trialed with the above sized poly insert.  The knee was found to have excellent balance and full motion.    The above named components were then impacted into place and Poly tibial piece and patella were inserted.  I was very happy with his stability and ROM  I performed a periarticular injection with marcaine and toradol  The knee was easily taken through a range of motion and the patella tracked well and the knee irrigated copiously and the parapatellar and subcutaneous tissue closed with vicryl, and monocryl with steri strips for the skin.  The incision was dressed with sterile gauze and the tourniquet released and the patient was awakened and returned to the PACU in stable and satisfactory condition.  There were no complications.  Total tourniquet time was roughly 75 minutes.   POSTOPERATIVE PLAN: post op Abx, DVT px: SCD's, TED's, Early ambulation and chemical  px

## 2016-11-11 NOTE — Interval H&P Note (Signed)
History and Physical Interval Note:  11/11/2016 9:27 AM  Martha CabotBarbara B Marano  has presented today for surgery, with the diagnosis of OA RIGHT KNEE  The various methods of treatment have been discussed with the patient and family. After consideration of risks, benefits and other options for treatment, the patient has consented to  Procedure(s): TOTAL KNEE ARTHROPLASTY (Right) as a surgical intervention .  The patient's history has been reviewed, patient examined, no change in status, stable for surgery.  I have reviewed the patient's chart and labs.  Questions were answered to the patient's satisfaction.     MURPHY, TIMOTHY D

## 2016-11-11 NOTE — Anesthesia Postprocedure Evaluation (Addendum)
Anesthesia Post Note  Patient: Martha CabotBarbara B Buck  Procedure(s) Performed: Procedure(s) (LRB): TOTAL KNEE ARTHROPLASTY (Right)  Patient location during evaluation: PACU Anesthesia Type: Regional Level of consciousness: oriented and awake and alert Pain management: pain level controlled Vital Signs Assessment: post-procedure vital signs reviewed and stable Respiratory status: spontaneous breathing, respiratory function stable and patient connected to nasal cannula oxygen Cardiovascular status: blood pressure returned to baseline and stable Postop Assessment: no headache, no backache and spinal receding Anesthetic complications: no       Last Vitals:  Vitals:   11/11/16 1515 11/11/16 1530  BP: (!) 146/84 138/83  Pulse: (!) 48 (!) 51  Resp: 12 17  Temp:      Last Pain:  Vitals:   11/11/16 1300  TempSrc:   PainSc: Asleep                 Shelton SilvasKevin D Hollis

## 2016-11-11 NOTE — Progress Notes (Signed)
Orthopedic Tech Progress Note Patient Details:  Martha Buck 05/19/1961 [id number]  CPM Right Knee CPM Right Knee: On Right Knee Flexion (Degrees): 90 Right Knee Extension (Degrees): 0 Additional Comments: ttrapeze bar patient helper   Nikki DomCrawford, Deylan Canterbury 11/11/2016, 1:34 PM Viewed order from doctor's order list

## 2016-11-11 NOTE — Anesthesia Preprocedure Evaluation (Addendum)
Anesthesia Evaluation  Patient identified by MRN, date of birth, ID band Patient awake    Reviewed: Allergy & Precautions, NPO status , Patient's Chart, lab work & pertinent test results  Airway Mallampati: II  TM Distance: >3 FB Neck ROM: Full    Dental  (+) Teeth Intact, Dental Advisory Given   Pulmonary Current Smoker,    breath sounds clear to auscultation       Cardiovascular negative cardio ROS   Rhythm:Regular Rate:Normal     Neuro/Psych PSYCHIATRIC DISORDERS Depression negative neurological ROS     GI/Hepatic negative GI ROS, Neg liver ROS,   Endo/Other  negative endocrine ROS  Renal/GU negative Renal ROS  negative genitourinary   Musculoskeletal  (+) Arthritis , Osteoarthritis,    Abdominal   Peds negative pediatric ROS (+)  Hematology negative hematology ROS (+)   Anesthesia Other Findings   Reproductive/Obstetrics negative OB ROS                            Lab Results  Component Value Date   WBC 9.8 10/31/2016   HGB 12.8 10/31/2016   HCT 39.8 10/31/2016   MCV 94.5 10/31/2016   PLT 271 10/31/2016   Lab Results  Component Value Date   CREATININE 0.76 10/31/2016   BUN 18 10/31/2016   NA 139 10/31/2016   K 3.9 10/31/2016   CL 108 10/31/2016   CO2 24 10/31/2016   No results found for: INR, PROTIME   Anesthesia Physical Anesthesia Plan  ASA: II  Anesthesia Plan: Spinal   Post-op Pain Management:  Regional for Post-op pain   Induction: Intravenous  Airway Management Planned: Natural Airway  Additional Equipment:   Intra-op Plan:   Post-operative Plan:   Informed Consent: I have reviewed the patients History and Physical, chart, labs and discussed the procedure including the risks, benefits and alternatives for the proposed anesthesia with the patient or authorized representative who has indicated his/her understanding and acceptance.   Dental advisory  given  Plan Discussed with: CRNA  Anesthesia Plan Comments:         Anesthesia Quick Evaluation

## 2016-11-12 ENCOUNTER — Encounter (HOSPITAL_COMMUNITY): Payer: Self-pay | Admitting: Orthopedic Surgery

## 2016-11-12 DIAGNOSIS — M1711 Unilateral primary osteoarthritis, right knee: Secondary | ICD-10-CM | POA: Diagnosis not present

## 2016-11-12 NOTE — Discharge Summary (Addendum)
Discharge Summary  [name]: [id number] DOB/AGE: 55/12/1960 55 y.o.  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 Depression   . DJD (degenerative joint disease) of knee    RIGHT  . History of kidney stones    hx of  . History of pneumonia   . Joint pain     Surgeries: Procedure(s): TOTAL KNEE ARTHROPLASTY on 11/11/2016   Consultants (if any):   Discharged Condition: Improved  Hospital Course: Martha Buck is an 56 y.o. female who was admitted 11/11/2016 with a diagnosis of Primary osteoarthritis of right knee and went to the operating room on 11/11/2016 and underwent the above named procedures.    She was given perioperative antibiotics:  Anti-infectives    Start     Dose/Rate Route Frequency Ordered Stop   11/11/16 1000  ceFAZolin (ANCEF) IVPB 2g/100 mL premix     2 g 200 mL/hr over 30 Minutes Intravenous On call to O.R. 11/11/16 0949 11/11/16 1100   11/11/16 0946  ceFAZolin (ANCEF) 2-4 GM/100ML-% IVPB    Comments:  Kathrene BongoSmith, Catherine   : cabinet override      11/11/16 0946 11/11/16 1100    .  She was given sequential compression devices, early ambulation, and Xarelto for DVT prophylaxis.  She benefited maximally from the hospital stay and there were no complications.    Recent vital signs:  Vitals:   11/12/16 0010 11/12/16 0404  BP: 135/62 120/73  Pulse: 72 85  Resp: 18 18  Temp: 97 F (36.1 C) 98.7 F (37.1 C)    Recent laboratory studies:  Lab Results  Component Value Date   HGB 11.9 (L) 11/11/2016   HGB 12.8 10/31/2016   HGB 12.0 10/08/2015   Lab Results  Component Value Date   WBC 8.9 11/11/2016   PLT 179 11/11/2016   No results  found for: INR Lab Results  Component Value Date   NA 139 10/31/2016   K 3.9 10/31/2016   CL 108 10/31/2016   CO2 24 10/31/2016   BUN 18 10/31/2016   CREATININE 0.76 10/31/2016   GLUCOSE 93 10/31/2016    Discharge Medications:   Allergies as of 11/12/2016      Reactions   Bee Venom Anaphylaxis   Aspirin Itching   Lyrica [pregabalin] Rash   Pt. States that she breaks out in hives when she takes Lyrica.    Morphine And Related Itching      Medication List    STOP taking these medications   acetaminophen 500 MG tablet Commonly known as:  TYLENOL     TAKE these medications   chlorhexidine 0.12 % solution Commonly known as:  PERIDEX Use as directed 15 mLs in the mouth or throat 2 (two) times daily.   docusate sodium 100 MG capsule Commonly known as:  COLACE Take 1 capsule (100 mg total) by mouth 2 (two) times daily. To prevent constipation while taking pain medication.   gabapentin 400 MG capsule Commonly known as:  NEURONTIN Take 400 mg by mouth 3 (three) times daily.   HYDROcodone-acetaminophen 10-325 MG tablet Commonly known as:  NORCO Take 1 tablet by mouth 3 (three) times daily.   methocarbamol 500 MG tablet Commonly known  as:  ROBAXIN Take 1 tablet (500 mg total) by mouth every 6 (six) hours as needed for muscle spasms.   ondansetron 4 MG tablet Commonly known as:  ZOFRAN Take 1 tablet (4 mg total) by mouth every 8 (eight) hours as needed for nausea or vomiting.   oxyCODONE-acetaminophen 5-325 MG tablet Commonly known as:  ROXICET Take 1-2 tablets by mouth every 4 (four) hours as needed for severe pain.   rivaroxaban 10 MG Tabs tablet Commonly known as:  XARELTO Take 1 tablet (10 mg total) by mouth daily. For 30 days for DVT prophylaxis       Diagnostic Studies: Dg Knee Right Port  Result Date: 11/11/2016 CLINICAL DATA:  Arthroplasty. EXAM: PORTABLE RIGHT KNEE - 1-2 VIEW COMPARISON:  01/01/2016 . FINDINGS: Total right knee replacement. Hardware  intact. Normal alignment. No acute bony abnormality identified. IMPRESSION: Total right knee replacement. No acute bony abnormality identified . Electronically Signed   By: Maisie Fus  Register   On: 11/11/2016 13:30    Disposition: 01-Home or Self Care    Follow-up Information    MURPHY, TIMOTHY D, MD Follow up.   Specialty:  Orthopedic Surgery Contact information: [address] [place] [address] [phone]           Signed: Albina Billet III PA-C 11/12/2016, 7:52 AM

## 2016-11-12 NOTE — Care Management CC44 (Signed)
Condition Code 44 Documentation Completed  Patient Details  Name: Martha Buck MRN: [id number] Date of Birth: 07/15/1961   Condition Code 44 given:  Yes Patient signature on Condition Code 44 notice:  Yes Documentation of 2 MD's agreement:  Yes Code 44 added to claim:  Yes    Durenda GuthrieBrady, Ashanna Heinsohn [name], RN 11/12/2016, 2:15 PM

## 2016-11-12 NOTE — Progress Notes (Signed)
[facility]. Voided and helped back to bed.

## 2016-11-12 NOTE — Progress Notes (Addendum)
   Assessment: 1 Day Post-Op  S/P Procedure(s) (LRB): TOTAL KNEE ARTHROPLASTY (Right) by Dr. Jewel Baizeimothy D. 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  Doing well.  Pain controlled.  OOB in the room.  Plan: Up with therapy D/C IV fluids Incentive Spirometry Discharge home with home health  Weight Bearing: Weight Bearing as Tolerated (WBAT)  Dressings: Mepilex.  VTE prophylaxis: Xarelto, SCDs, ambulation Dispo: Home w/ HHPT pending PT evaluation  Subjective: Patient reports pain as moderate. Pain controlled with PO meds.  Tolerating diet.  Urinating.  +Flatus.  No CP, SOB.  OOB in room.  Objective:   VITALS:   Vitals:   11/11/16 1700 11/11/16 1900 11/12/16 0010 11/12/16 0404  BP: (!) 137/97 104/67 135/62 120/73  Pulse: (!) 51 79 72 85  Resp: 17 18 18 18   Temp:  97.9 F (36.6 C) 97 F (36.1 C) 98.7 F (37.1 C)  TempSrc:  Oral Oral Oral  SpO2: 100% 97% 97% 100%   CBC Latest Ref Rng & Units 11/11/2016 10/31/2016 10/08/2015  WBC 4.0 - 10.5 K/uL 8.9 9.8 8.6  Hemoglobin 12.0 - 15.0 g/dL 11.9(L) 12.8 12.0  Hematocrit 36.0 - 46.0 % 36.9 39.8 36.2  Platelets 150 - 400 K/uL 179 271 230   BMP Latest Ref Rng & Units 10/31/2016 10/08/2015 09/14/2015  Glucose 65 - 99 mg/dL 93 94 782(N105(H)  BUN 6 - 20 mg/dL 18 10 12   Creatinine 0.44 - 1.00 mg/dL 5.620.76 1.300.71 8.650.81  Sodium 135 - 145 mmol/L 139 143 140  Potassium 3.5 - 5.1 mmol/L 3.9 3.5 3.6  Chloride 101 - 111 mmol/L 108 107 114(H)  CO2 22 - 32 mmol/L 24 25 21(L)  Calcium 8.9 - 10.3 mg/dL 8.1(L) 9.0 6.8(L)   Intake/Output      03/27 0701 - 03/28 0700 03/28 0701 - 03/29 0700   P.O. 720    I.V. 2200    Total Intake 2920     Urine 2050    Blood 75    Total Output 2125     Net +795          Urine Occurrence 4 x      Physical Exam: General: NAD.  Upright in bed eating breakfast.   Talkative. Resp: No increased wob Cardio: regular rate and rhythm ABD soft Neurologically intact MSK RLE:  Ace wrap in place. Neurovascularly intact Sensation intact distally Feet warm Dorsiflexion/Plantar flexion intact Incision: dressing C/D/I   Albina BilletHenry Calvin Martensen III, PA-C 11/12/2016, 7:47 AM

## 2016-11-12 NOTE — Progress Notes (Addendum)
qPhysical Therapy Treatment Patient Details Name: Martha HakeBarbara B Lalani MRN: [id number] DOB: 02/13/1961 Today's Date: 11/12/2016    History of Present Illness R TKA, h/o lumbar fusion 2011    PT Comments    Pt progressing well with mobility, she ambulated 150' with RW and performed R TKA exercises with assistance. Pt stated that she cannot DC home today because her sister, who will be primary caregiver, is at dialysis. She requested DC home tomorrow, RN aware.    Follow Up Recommendations  Home health PT     Equipment Recommendations  Rolling walker with 5" wheels;3in1 (PT)    Recommendations for Other Services       Precautions / Restrictions Precautions Precautions: Fall;Knee Required Braces or Orthoses: Knee Immobilizer - Right Restrictions RLE Weight Bearing: Weight bearing as tolerated    Mobility  Bed Mobility Overal bed mobility: Needs Assistance Bed Mobility: Sit to Supine       Sit to supine: Min guard   General bed mobility comments: instructed pt to self assist RLE with LLE  Transfers Overall transfer level: Needs assistance Equipment used: Rolling walker (2 wheeled) Transfers: Sit to/from Stand Sit to Stand: Min guard         General transfer comment: VCs hand placement  Ambulation/Gait Ambulation/Gait assistance: Min guard Ambulation Distance (Feet): 150 Feet Assistive device: Rolling walker (2 wheeled) Gait Pattern/deviations: Step-to pattern;Antalgic;Decreased weight shift to right     General Gait Details: steady with RW, no LOB, VCs for sequencing   Stairs            Wheelchair Mobility    Modified Rankin (Stroke Patients Only)       Balance Overall balance assessment: Modified Independent                                          Cognition Arousal/Alertness: Awake/alert Behavior During Therapy: WFL for tasks assessed/performed Overall Cognitive Status: Within Functional Limits for tasks assessed                                        Exercises Total Joint Exercises Ankle Circles/Pumps: AROM;Both;10 reps;Supine Quad Sets: AROM;Both;10 reps;Supine- trace contraction on R Short Arc Quad: AAROM;Right;10 reps;Supine Hip ABduction/ADduction: AAROM;Right;10 reps;Supine Straight Leg Raises: AAROM;Right;Supine;10 reps Long Arc Quad: AAROM;Right;10 reps;Seated Knee Flexion: AAROM;Right;5 reps;Seated Goniometric ROM: 0-45* AAROM R knee    General Comments        Pertinent Vitals/Pain Pain Score: 8  Pain Location: R knee with mobility Pain Descriptors / Indicators: Sore;Throbbing Pain Intervention(s): Limited activity within patient's tolerance;Monitored during session;Premedicated before session;Ice applied    Home Living Family/patient expects to be discharged to:: Private residence Living Arrangements: Children Available Help at Discharge: Family Type of Home: House Home Access: Level entry   Home Layout: One level Home Equipment: Environmental consultantWalker - 4 wheels Additional Comments: has 56 year old    Prior Function Level of Independence: Needs assistance  Gait / Transfers Assistance Needed: walked with rollator, several falls in past 1 year   Comments: had home health aide 2 hours/day for bathing/dressing and housework   PT Goals (current goals can now be found in the care plan section) Acute Rehab PT Goals Patient Stated Goal: to walk, likes to go to high school games where her daughter plays in band PT  Goal Formulation: With patient Time For Goal Achievement: 11/26/16 Potential to Achieve Goals: Good Progress towards PT goals: Progressing toward goals    Frequency    7X/week      PT Plan Current plan remains appropriate    Co-evaluation             End of Session Equipment Utilized During Treatment: Gait belt;Right knee immobilizer Activity Tolerance: Patient tolerated treatment well Patient left: with call bell/phone within reach;in bed Nurse  Communication: Mobility status PT Visit Diagnosis: Muscle weakness (generalized) (M62.81);Repeated falls (R29.6);Other abnormalities of gait and mobility (R26.89);Pain Pain - Right/Left: Right Pain - part of body: Knee     Time: 1610-9604 PT Time Calculation (min) (ACUTE ONLY): 32 min  Charges:  $Gait Training: 8-22 mins $Therapeutic Exercise: 8-22 mins                    G Codes:          Tamala Ser 11/12/2016, 3:33 PM [phone]

## 2016-11-12 NOTE — Care Management Obs Status (Signed)
MEDICARE OBSERVATION STATUS NOTIFICATION   Patient Details  Name: Martha Buck MRN: [id number] Date of Birth: 02/05/1961   Medicare Observation Status Notification Given:  Yes    Durenda GuthrieBrady, Jhony Antrim [name], RN 11/12/2016, 2:15 PM

## 2016-11-12 NOTE — Discharge Instructions (Signed)
INSTRUCTIONS AFTER JOINT REPLACEMENT  ° °o Remove items at home which could result in a fall. This includes throw rugs or furniture in walking pathways °o ICE to the affected joint every three hours while awake for 30 minutes at a time, for at least the first 3-5 days, and then as needed for pain and swelling.  Continue to use ice for pain and swelling. You may notice swelling that will progress down to the foot and ankle.  This is normal after surgery.  Elevate your leg when you are not up walking on it.   °o Continue to use the breathing machine you got in the hospital (incentive spirometer) which will help keep your temperature down.  It is common for your temperature to cycle up and down following surgery, especially at night when you are not up moving around and exerting yourself.  The breathing machine keeps your lungs expanded and your temperature down. ° ° °DIET:  As you were doing prior to hospitalization, we recommend a well-balanced diet. ° °DRESSING / WOUND CARE / SHOWERING ° °Keep the surgical dressing until follow up. IF THE DRESSING FALLS OFF or the wound gets wet inside, change the dressing with sterile gauze.  Please use good hand washing techniques before changing the dressing.  Do not use any lotions or creams on the incision until instructed by your surgeon.   ° °ACTIVITY ° °o Increase activity slowly as tolerated, but follow the weight bearing instructions below.   °o No driving for 6 weeks or until further direction given by your physician.  You cannot drive while taking narcotics.  °o No lifting or carrying greater than 10 lbs. until further directed by your surgeon. °o Avoid periods of inactivity such as sitting longer than an hour when not asleep. This helps prevent blood clots.  °o You may return to work once you are authorized by your doctor.  ° ° ° °WEIGHT BEARING  ° °Weight bearing as tolerated with assist device (walker, cane, etc) as directed, use it as long as suggested by your  surgeon or therapist, typically at least 4-6 weeks. ° ° °EXERCISES ° °Results after joint replacement surgery are often greatly improved when you follow the exercise, range of motion and muscle strengthening exercises prescribed by your doctor. Safety measures are also important to protect the joint from further injury. Any time any of these exercises cause you to have increased pain or swelling, decrease what you are doing until you are comfortable again and then slowly increase them. If you have problems or questions, call your caregiver or physical therapist for advice.  ° °Rehabilitation is important following a joint replacement. After just a few days of immobilization, the muscles of the leg can become weakened and shrink (atrophy).  These exercises are designed to build up the tone and strength of the thigh and leg muscles and to improve motion. Often times heat used for twenty to thirty minutes before working out will loosen up your tissues and help with improving the range of motion but do not use heat for the first two weeks following surgery (sometimes heat can increase post-operative swelling).  ° °These exercises can be done on a training (exercise) mat, on the floor, on a table or on a bed. Use whatever works the best and is most comfortable for you.    Use music or television while you are exercising so that the exercises are a pleasant break in your day. This will make your life better   with the exercises acting as a break in your routine that you can look forward to.   Perform all exercises about fifteen times, three times per day or as directed.  You should exercise both the operative leg and the other leg as well. ° °Exercises include: °  °• Quad Sets - Tighten up the muscle on the front of the thigh (Quad) and hold for 5-10 seconds.   °• Straight Leg Raises - With your knee straight (if you were given a brace, keep it on), lift the leg to 60 degrees, hold for 3 seconds, and slowly lower the leg.   Perform this exercise against resistance later as your leg gets stronger.  °• Leg Slides: Lying on your back, slowly slide your foot toward your buttocks, bending your knee up off the floor (only go as far as is comfortable). Then slowly slide your foot back down until your leg is flat on the floor again.  °• Angel Wings: Lying on your back spread your legs to the side as far apart as you can without causing discomfort.  °• Hamstring Strength:  Lying on your back, push your heel against the floor with your leg straight by tightening up the muscles of your buttocks.  Repeat, but this time bend your knee to a comfortable angle, and push your heel against the floor.  You may put a pillow under the heel to make it more comfortable if necessary.  ° °A rehabilitation program following joint replacement surgery can speed recovery and prevent re-injury in the future due to weakened muscles. Contact your doctor or a physical therapist for more information on knee rehabilitation.  ° ° °CONSTIPATION ° °Constipation is defined medically as fewer than three stools per week and severe constipation as less than one stool per week.  Even if you have a regular bowel pattern at home, your normal regimen is likely to be disrupted due to multiple reasons following surgery.  Combination of anesthesia, postoperative narcotics, change in appetite and fluid intake all can affect your bowels.  ° °YOU MUST use at least one of the following options; they are listed in order of increasing strength to get the job done.  They are all available over the counter, and you may need to use some, POSSIBLY even all of these options:   ° °Drink plenty of fluids (prune juice may be helpful) and high fiber foods °Colace 100 mg by mouth twice a day  °Senokot for constipation as directed and as needed Dulcolax (bisacodyl), take with full glass of water  °Miralax (polyethylene glycol) once or twice a day as needed. ° °If you have tried all these things and  are unable to have a bowel movement in the first 3-4 days after surgery call either your surgeon or your primary doctor.   ° °If you experience loose stools or diarrhea, hold the medications until you stool forms back up.  If your symptoms do not get better within 1 week or if they get worse, check with your doctor.  If you experience "the worst abdominal pain ever" or develop nausea or vomiting, please contact the office immediately for further recommendations for treatment. ° ° °ITCHING:  If you experience itching with your medications, try taking only a single pain pill, or even half a pain pill at a time.  You can also use Benadryl over the counter for itching or also to help with sleep.  ° °TED HOSE STOCKINGS:  Use stockings on both legs until   for at least 2 weeks or as directed by physician office. They may be removed at night for sleeping. ° °MEDICATIONS:  See your medication summary on the “After Visit Summary” that nursing will review with you.  You may have some home medications which will be placed on hold until you complete the course of blood thinner medication.  It is important for you to complete the blood thinner medication as prescribed. ° °PRECAUTIONS:  If you experience chest pain or shortness of breath - call 911 immediately for transfer to the hospital emergency department.  ° °If you develop a fever greater that 101 F, purulent drainage from wound, increased redness or drainage from wound, foul odor from the wound/dressing, or calf pain - CONTACT YOUR SURGEON.   °                                                °FOLLOW-UP APPOINTMENTS:  If you do not already have a post-op appointment, please call the office for an appointment to be seen by your surgeon.  Guidelines for how soon to be seen are listed in your “After Visit Summary”, but are typically between 1-4 weeks after surgery. ° °OTHER INSTRUCTIONS:  ° °Knee Replacement:  Do not place pillow under knee, focus on keeping the knee straight  while resting. CPM instructions: 0-90 degrees, 2 hours in the morning, 2 hours in the afternoon, and 2 hours in the evening. Place foam block, curve side up under heel at all times except when in CPM or when walking.  DO NOT modify, tear, cut, or change the foam block in any way. ° °MAKE SURE YOU:  °• Understand these instructions.  °• Get help right away if you are not doing well or get worse.  ° ° °Thank you for letting us be a part of your medical care team.  It is a privilege we respect greatly.  We hope these instructions will help you stay on track for a fast and full recovery!  ° ° ° ° °Information on my medicine - XARELTO® (Rivaroxaban) ° °This medication education was reviewed with me or my healthcare representative as part of my discharge preparation.  The pharmacist that spoke with me during my hospital stay was:  Arwa Yero Dien, RPH ° °Why was Xarelto® prescribed for you? °Xarelto® was prescribed for you to reduce the risk of blood clots forming after orthopedic surgery. The medical term for these abnormal blood clots is venous thromboembolism (VTE). ° °What do you need to know about xarelto® ? °Take your Xarelto® ONCE DAILY at the same time every day. °You may take it either with or without food. ° °If you have difficulty swallowing the tablet whole, you may crush it and mix in applesauce just prior to taking your dose. ° °Take Xarelto® exactly as prescribed by your doctor and DO NOT stop taking Xarelto® without talking to the doctor who prescribed the medication.  Stopping without other VTE prevention medication to take the place of Xarelto® may increase your risk of developing a clot. ° °After discharge, you should have regular check-up appointments with your healthcare provider that is prescribing your Xarelto®.   ° °What do you do if you miss a dose? °If you miss a dose, take it as soon as you remember on the same day then continue your regularly scheduled once daily   regimen the next day. Do not  take two doses of Xarelto® on the same day.  ° °Important Safety Information °A possible side effect of Xarelto® is bleeding. You should call your healthcare provider right away if you experience any of the following: °? Bleeding from an injury or your nose that does not stop. °? Unusual colored urine (red or dark brown) or unusual colored stools (red or black). °? Unusual bruising for unknown reasons. °? A serious fall or if you hit your head (even if there is no bleeding). ° °Some medicines may interact with Xarelto® and might increase your risk of bleeding while on Xarelto®. To help avoid this, consult your healthcare provider or pharmacist prior to using any new prescription or non-prescription medications, including herbals, vitamins, non-steroidal anti-inflammatory drugs (NSAIDs) and supplements. ° °This website has more information on Xarelto®: www.xarelto.com. ° ° ° ° °

## 2016-11-12 NOTE — Evaluation (Addendum)
Physical Therapy Evaluation Patient Details Name: Martha Buck MRN: [id number] DOB: 10/22/1960 Today's Date: 11/12/2016   History of Present Illness  R TKA, h/o lumbar fusion 2011  Clinical Impression  Pt admitted with above diagnosis. Pt currently with functional limitations due to the deficits listed below (see PT Problem List). Pt ambulated 100' with RW, no loss of balance. Good progress expected. Noted pt has diminished ankle dorsiflexion strength on R, she stated this may have been present prior to admission, but was vague with details, noted h/o back surgery. Sensation intact R foot. Also noted cramping of R toes into extension while walking. Muscle relaxer requested.   Pt will benefit from skilled PT to increase their independence and safety with mobility to allow discharge to the venue listed below.       Follow Up Recommendations Home health PT    Equipment Recommendations  Rolling walker with 5" wheels;3in1 (PT)    Recommendations for Other Services       Precautions / Restrictions Precautions Precautions: Fall;Knee Required Braces or Orthoses: Knee Immobilizer - Right Restrictions Weight Bearing Restrictions: Yes RLE Weight Bearing: Weight bearing as tolerated      Mobility  Bed Mobility Overal bed mobility: Needs Assistance Bed Mobility: Supine to Sit     Supine to sit: Min assist     General bed mobility comments: min A for RLE, VCs technique  Transfers Overall transfer level: Needs assistance Equipment used: Rolling walker (2 wheeled) Transfers: Sit to/from Stand Sit to Stand: Min assist         General transfer comment: VCs hand placement, min A to rise  Ambulation/Gait Ambulation/Gait assistance: Min guard Ambulation Distance (Feet): 100 Feet Assistive device: Rolling walker (2 wheeled) Gait Pattern/deviations: Step-to pattern;Antalgic;Decreased weight shift to right   Gait velocity interpretation: Below normal speed for age/gender General  Gait Details: steady with RW, no LOB, VCs for sequencing, ambulated with KI  Stairs            Wheelchair Mobility    Modified Rankin (Stroke Patients Only)       Balance Overall balance assessment: Modified Independent                                           Pertinent Vitals/Pain Pain Assessment: 0-10 Pain Score: 7  Pain Location: R knee with mobility Pain Descriptors / Indicators: Spasm;Sore;Throbbing Pain Intervention(s): Limited activity within patient's tolerance;Monitored during session;RN gave pain meds during session;Ice applied    Home Living Family/patient expects to be discharged to:: Private residence Living Arrangements: Children Available Help at Discharge: Family   Home Access: Level entry     Home Layout: One level Home Equipment: Environmental consultantWalker - 4 wheels Additional Comments: has 56 year old    Prior Function Level of Independence: Needs assistance   Gait / Transfers Assistance Needed: walked with rollator, several falls in past 1 year     Comments: had home health aide 2 hours/day for bathing/dressing and housework     Hand Dominance        Extremity/Trunk Assessment   Upper Extremity Assessment Upper Extremity Assessment: Overall WFL for tasks assessed    Lower Extremity Assessment Lower Extremity Assessment: RLE deficits/detail RLE Deficits / Details: 3/5 SLR, knee 0-30* AAROM, ankle DF AROM -10*, PROM WFL, pt stated R ankle weakness may have been present prior to admission, she was vague when asked  for details, R toes cramped into extension with walking    Cervical / Trunk Assessment Cervical / Trunk Assessment: Normal  Communication   Communication: No difficulties  Cognition Arousal/Alertness: Awake/alert Behavior During Therapy: WFL for tasks assessed/performed Overall Cognitive Status: Within Functional Limits for tasks assessed                                        General Comments       Exercises Total Joint Exercises Ankle Circles/Pumps: AROM;Both;10 reps;Supine Quad Sets: AROM;Both;10 reps;Supine Heel Slides: AAROM;Right;10 reps;Supine Straight Leg Raises: AAROM;Right;5 reps;AROM;Supine Goniometric ROM: 0-30* AAROM   Assessment/Plan    PT Assessment Patient needs continued PT services  PT Problem List Decreased range of motion;Decreased strength;Decreased activity tolerance;Decreased knowledge of use of DME;Decreased mobility;Pain       PT Treatment Interventions DME instruction;Gait training;Functional mobility training;Therapeutic exercise;Therapeutic activities;Patient/family education    PT Goals (Current goals can be found in the Care Plan section)  Acute Rehab PT Goals Patient Stated Goal: to walk, likes to go to high school games where her daughter plays in band PT Goal Formulation: With patient Time For Goal Achievement: 11/26/16 Potential to Achieve Goals: Good    Frequency 7X/week   Barriers to discharge        Co-evaluation               End of Session Equipment Utilized During Treatment: Gait belt;Right knee immobilizer Activity Tolerance: Patient limited by pain Patient left: in chair;with call bell/phone within reach Nurse Communication: Mobility status PT Visit Diagnosis: Muscle weakness (generalized) (M62.81);Repeated falls (R29.6);Other abnormalities of gait and mobility (R26.89);Pain Pain - Right/Left: Right Pain - part of body: Knee    Time: 0930-1021 PT Time Calculation (min) (ACUTE ONLY): 51 min   Charges:   PT Evaluation $PT Eval Low Complexity: 1 Procedure PT Treatments $Gait Training: 8-22 mins $Therapeutic Exercise: 8-22 mins   PT G Codes:       PT G-Codes **NOT FOR INPATIENT CLASS**  Functional Assessment Tool Used AM-PAC 6 Clicks Basic Mobility byJenniferKUhlenberg,PT at03/28/181026       Functional Limitation Mobility: Walking and moving around byJenniferKUhlenberg,PT at03/28/181026        Mobility: Walking and Moving Around Current Status (Z6109) At least 40 percent but less than 60 percent impaired, limited or restricted       Mobility: Walking and Moving Around Goal Status (U0454) At least 1 percent but less than 20 percent impaired, limited or restricted       Mobility: Walking and Moving Around Discharge Status [phone])             Tamala Ser 11/12/2016, 10:27 AM [phone]

## 2016-11-12 NOTE — Care Management Note (Signed)
Case Management Note  Patient Details  Name: Martha Buck MRN: [id number] Date of Birth: 12/30/1960  Subjective/Objective:  56 yr old female s/p right total knee arthroplasty.                  Action/Plan: Case manager spoke with patient concerning Home Health and DME needs. Patient was preoperatively setup with [facility], no changes. Patient asked CM about her personal care services that she receives, CM explained that she will need to contact her primary MD's office to get the paperwork necessary for resumption of those services, it is not related to this hospitalization. Patient expressed understanding.      Expected Discharge Date:  11/12/16               Expected Discharge Plan:  Home w Home Health Services  In-House Referral:  NA  Discharge planning Services  CM Consult  Post Acute Care Choice:  Durable Medical Equipment, Home Health Choice offered to:  Patient  DME Arranged:  CPM DME Agency:  [employer]  HH Arranged:  PT HH Agency:  [facility] (formerly [facility]  Status of Service:  Completed, signed off  If discussed at [employer] Meetings, dates discussed:    Additional Comments:  Martha Buck, Martha Holroyd [name], RN 11/12/2016, 2:21 PM

## 2016-11-12 NOTE — Evaluation (Signed)
Occupational Therapy Evaluation Patient Details Name: Martha Buck MRN: [id number] DOB: 03/27/1961 Today's Date: 11/12/2016    History of Present Illness Martha Buck TKA, h/o lumbar fusion 2011   Clinical Impression   Pt is s/p TKA resulting in the deficits listed below (see OT Problem List). Pt will benefit from skilled OT to increase their safety and independence with ADL and functional mobility for ADL to facilitate discharge to venue listed below.       Follow Up Recommendations  No OT follow up    Equipment Recommendations  None recommended by OT       Precautions / Restrictions Precautions Precautions: Fall;Knee Required Braces or Orthoses: Knee Immobilizer - Right Restrictions Weight Bearing Restrictions: Yes RLE Weight Bearing: Weight bearing as tolerated      Mobility Bed Mobility Overal bed mobility: Needs Assistance Bed Mobility: Sit to Supine     Supine to sit: Min assist Sit to supine: Min assist   General bed mobility comments: min A for RLE, VCs technique  Transfers Overall transfer level: Needs assistance Equipment used: Rolling walker (2 wheeled) Transfers: Sit to/from Stand Sit to Stand: Min assist         General transfer comment: VCs hand placement    Balance Overall balance assessment: Modified Independent                                         ADL either performed or assessed with clinical judgement   ADL Overall ADL's : Needs assistance/impaired Eating/Feeding: Set up;Sitting   Grooming: Set up;Sitting   Upper Body Bathing: Set up;Sitting   Lower Body Bathing: Maximal assistance;Sit to/from stand;Cueing for safety;Cueing for sequencing   Upper Body Dressing : Set up;Sitting   Lower Body Dressing: Maximal assistance;Cueing for safety;Cueing for sequencing;Sit to/from stand Lower Body Dressing Details (indicate cue type and reason): may benefit from AE Toilet Transfer: Minimal assistance;Cueing for safety;BSC;RW    Toileting- Clothing Manipulation and Hygiene: Minimal assistance;Sit to/from stand;Cueing for safety;Cueing for sequencing         General ADL Comments: Pt in pain and itching. RN aware.  Pt wanted to get back to bed from chair and eat her lunch.  Pt overall min A with transfer but complained of increased pain when she tried to put weight on Martha Buck leg so she treated Martha Buck LE like she was TDWB even though she is full WB.     Vision Patient Visual Report: No change from baseline              Pertinent Vitals/Pain Pain Assessment: 0-10 Pain Score: 9  Pain Location: Martha Buck knee with mobility Pain Descriptors / Indicators: Spasm;Sore;Throbbing Pain Intervention(s): Monitored during session;Premedicated before session;Repositioned;Ice applied;Limited activity within patient's tolerance     Hand Dominance     Extremity/Trunk Assessment Upper Extremity Assessment Upper Extremity Assessment: Overall WFL for tasks assessed     Cervical / Trunk Assessment Cervical / Trunk Assessment: Normal   Communication Communication Communication: No difficulties   Cognition Arousal/Alertness: Awake/alert Behavior During Therapy: WFL for tasks assessed/performed Overall Cognitive Status: Within Functional Limits for tasks assessed                                        Exercises Total Joint Exercises Ankle Circles/Pumps: AROM;Both;10 reps;Supine Quad Sets: AROM;Both;10 reps;Supine  Heel Slides: AAROM;Right;10 reps;Supine Straight Leg Raises: AAROM;Right;5 reps;AROM;Supine Goniometric ROM: 0-30* AAROM   Shoulder Instructions      Home Living Family/patient expects to be discharged to:: Private residence Living Arrangements: Children Available Help at Discharge: Family Type of Home: House Home Access: Level entry     Home Layout: One level     Bathroom Shower/Tub: Chief Strategy Officer: Standard     Home Equipment: Environmental consultant - 4 wheels   Additional Comments: has  55 year old      Prior Functioning/Environment Level of Independence: Needs assistance  Gait / Transfers Assistance Needed: walked with rollator, several falls in past 1 year     Comments: had home health aide 2 hours/day for bathing/dressing and housework        OT Problem List: Pain;Decreased knowledge of use of DME or AE;Decreased strength      OT Treatment/Interventions: Self-care/ADL training;DME and/or AE instruction;Patient/family education    OT Goals(Current goals can be found in the care plan section) Acute Rehab OT Goals Patient Stated Goal: to walk, likes to go to high school games where her daughter plays in band OT Goal Formulation: With patient Time For Goal Achievement: 11/26/16 Potential to Achieve Goals: Good ADL Goals Pt Will Perform Grooming: with modified independence;standing Pt Will Perform Lower Body Dressing: with modified independence;sit to/from stand Pt Will Transfer to Toilet: with modified independence;ambulating;regular height toilet Pt Will Perform Toileting - Clothing Manipulation and hygiene: with modified independence;sit to/from stand Pt Will Perform Tub/Shower Transfer: Tub transfer;3 in 1;with caregiver independent in assisting;with supervision  OT Frequency: Min 2X/week              End of Session Equipment Utilized During Treatment: Engineer, water Communication: Mobility status  Activity Tolerance: Patient tolerated treatment well Patient left: in bed;with call bell/phone within reach;with bed alarm set  OT Visit Diagnosis: Pain;Other abnormalities of gait and mobility (R26.89) Pain - Right/Left: Right Pain - part of body: Knee                Time: 9562-1308 OT Time Calculation (min): 25 min Charges:  OT General Charges $OT Visit: 1 Procedure OT Evaluation $OT Eval Moderate Complexity: 1 Procedure OT Treatments $Self Care/Home Management : 8-22 mins G-Codes: OT G-codes **NOT FOR INPATIENT CLASS** Functional  Assessment Tool Used: Clinical judgement Functional Limitation: Self care Self Care Current Status (M5784): At least 40 percent but less than 60 percent impaired, limited or restricted Self Care Goal Status (O9629): At least 1 percent but less than 20 percent impaired, limited or restricted   Lise Auer, [place] [phone]  Einar Crow D 11/12/2016, 1:04 PM

## 2016-11-13 DIAGNOSIS — M1711 Unilateral primary osteoarthritis, right knee: Secondary | ICD-10-CM | POA: Diagnosis not present

## 2016-11-13 NOTE — Progress Notes (Signed)
Occupational Therapy Treatment Patient Details Name: Martha Buck MRN: [id number] DOB: 1961-04-16 Today's Date: 11/13/2016    History of present illness R TKA, h/o lumbar fusion 2011   OT comments  Pt progressing somewhat towards acute OT goals. Appeared anxious/agitated during session; talkative. Pt with some decreased safety awareness and not achieving WBAT with RLE during session. "It hurts too bad!" Session details below. D/c plan remains appropriate.  Follow Up Recommendations  No OT follow up    Equipment Recommendations  None recommended by OT    Recommendations for Other Services      Precautions / Restrictions Precautions Precautions: Fall;Knee Required Braces or Orthoses: Knee Immobilizer - Right Restrictions Weight Bearing Restrictions: Yes RLE Weight Bearing: Weight bearing as tolerated       Mobility Bed Mobility Overal bed mobility: Needs Assistance Bed Mobility: Supine to Sit     Supine to sit: Min assist     General bed mobility comments: Min A to advance RLE. Pt declined to come fully EOB with RLE hanging off EOB at an angle with KI on. "I can't put it down. I have to do it this way."  Transfers Overall transfer level: Needs assistance Equipment used: Rolling walker (2 wheeled) Transfers: Sit to/from Stand Sit to Stand: Min assist         General transfer comment: assist to stabilize rw and guard L hip with pt standing at angle from EOB with RLE in KI and RLE outside of rw. Decreased safety awareness. She maintains NWB-TDWB on RLE despite reassurance of WBAT status. "It hurts too bad!"    Balance Overall balance assessment: Needs assistance         Standing balance support: Bilateral upper extremity supported;During functional activity Standing balance-Leahy Scale: Poor                             ADL either performed or assessed with clinical judgement   ADL Overall ADL's : Needs assistance/impaired                          Toilet Transfer: Minimal assistance;Cueing for safety;Ambulation;RW (3n1 over toilet)   Toileting- Clothing Manipulation and Hygiene: Minimal assistance;Sit to/from stand;Cueing for safety;Cueing for sequencing Toileting - Clothing Manipulation Details (indicate cue type and reason): min A to steady balance with pt completing pericare in standing       General ADL Comments: Pt completed bed mobility, toilet transfer, pericare and 1 grooming task standing at sink as detailed above. Pt very talkative and anxious/agitated during session. Some descreased safety awareness. Pt declined coming to EOB position before standing and instead stood at an angle. Pt advised that this was not safe and that her L hip was off the bed increasing risk of fall. Pt responding "I have to do it this way!" Therapist guarding hip and blocking rw during standing for safety. Pt noted to keep R toes in extended position and ambulated as if NWB-TDWB due to pain. "It hurts to bad to put any weight on it. My toes are doing that because of the pain. I can't relax them because of the pain." Reviewed LB ADL strategies and AE. Pt reporting that she is familiar with AE from previous back surgery. Encouragement needed to ambulate to bathroom despite having walked in hall with PT yesterday, "I've been doing the [facility]. I don't even know what the bathroom looks like."  Vision       Perception     Praxis      Cognition Arousal/Alertness: Awake/alert Behavior During Therapy: Anxious;Agitated;Impulsive Overall Cognitive Status: Within Functional Limits for tasks assessed                                          Exercises     Shoulder Instructions       General Comments      Pertinent Vitals/ Pain       Pain Assessment: Faces Faces Pain Scale: Hurts whole lot Pain Location: R knee with mobility Pain Descriptors / Indicators: Sore;Throbbing Pain Intervention(s): Limited activity within  patient's tolerance;Monitored during session;Repositioned  Home Living                                          Prior Functioning/Environment              Frequency  Min 2X/week        Progress Toward Goals  OT Goals(current goals can now be found in the care plan section)  Progress towards OT goals: Progressing toward goals  Acute Rehab OT Goals Patient Stated Goal: to walk, likes to go to high school games where her daughter plays in band OT Goal Formulation: With patient Time For Goal Achievement: 11/26/16 Potential to Achieve Goals: Good ADL Goals Pt Will Perform Grooming: with modified independence;standing Pt Will Perform Lower Body Dressing: with modified independence;sit to/from stand Pt Will Transfer to Toilet: with modified independence;ambulating;regular height toilet Pt Will Perform Toileting - Clothing Manipulation and hygiene: with modified independence;sit to/from stand Pt Will Perform Tub/Shower Transfer: Tub transfer;3 in 1;with caregiver independent in assisting;with supervision  Plan Discharge plan remains appropriate    Co-evaluation                 End of Session Equipment Utilized During Treatment: Gait belt;Rolling walker  OT Visit Diagnosis: Pain;Other abnormalities of gait and mobility (R26.89) Pain - Right/Left: Right Pain - part of body: Knee   Activity Tolerance Patient limited by pain   Patient Left in chair;with call bell/phone within reach   Nurse Communication Other (comment) (Nurse present at start of session)    Functional Assessment Tool Used: Clinical judgement Functional Limitation: Self care Self Care Current Status (Z6109(G8987): At least 40 percent but less than 60 percent impaired, limited or restricted Self Care Goal Status (U0454(G8988): At least 1 percent but less than 20 percent impaired, limited or restricted   Time: 1036-1056 OT Time Calculation (min): 20 min  Charges: OT G-codes **NOT FOR  INPATIENT CLASS** Functional Assessment Tool Used: Clinical judgement Functional Limitation: Self care Self Care Current Status (U9811(G8987): At least 40 percent but less than 60 percent impaired, limited or restricted Self Care Goal Status (B1478(G8988): At least 1 percent but less than 20 percent impaired, limited or restricted OT General Charges $OT Visit: 1 Procedure OT Treatments $Self Care/Home Management : 8-22 mins     Pilar GrammesMathews, Alekxander Isola H 11/13/2016, 11:16 AM

## 2016-11-13 NOTE — Progress Notes (Signed)
Orthopedic Tech Progress Note Patient Details:  Martha Buck 10/28/1960 [id number]  [name], female   DOB: 10/28/1960, 56 y.o.   MRN: [id number] Applied cpm 0-20. Pt was having to much pain to go any higher.  Trinna PostMartinez, Jamiyla Ishee J 11/13/2016, 6:36 AM

## 2016-11-13 NOTE — Progress Notes (Signed)
   Assessment: 2 Days Post-Op  S/P Procedure(s) (LRB): TOTAL KNEE ARTHROPLASTY (Right) by Dr. Jewel Baizeimothy D. 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  Progressing well.   Plan: Up with therapy Incentive Spirometry Apply Ice Discharge home with home health  Weight Bearing: Weight Bearing as Tolerated (WBAT)  Dressings: Mepilex.  VTE prophylaxis: Xarelto, SCDs, ambulation Dispo: Home w/ HHPT later today.  Patient also has had Home health aid due to back pain.  Subjective: Patient reports pain as moderate. Pain controlled.  Tolerating diet.  Urinating.  +Flatus.  No CP, SOB.  OOB hallway.  Objective:   VITALS:   Vitals:   11/12/16 0404 11/12/16 1459 11/12/16 2125 11/13/16 0417  BP: 120/73 (!) 122/97 124/78 (!) 145/86  Pulse: 85 66 70 72  Resp: 18 17 17 18   Temp: 98.7 F (37.1 C) 98.8 F (37.1 C) 98.5 F (36.9 C) 98.2 F (36.8 C)  TempSrc: Oral Oral Oral Oral  SpO2: 100% 100% 100% 97%   CBC Latest Ref Rng & Units 11/11/2016 10/31/2016 10/08/2015  WBC 4.0 - 10.5 K/uL 8.9 9.8 8.6  Hemoglobin 12.0 - 15.0 g/dL 11.9(L) 12.8 12.0  Hematocrit 36.0 - 46.0 % 36.9 39.8 36.2  Platelets 150 - 400 K/uL 179 271 230   BMP Latest Ref Rng & Units 10/31/2016 10/08/2015 09/14/2015  Glucose 65 - 99 mg/dL 93 94 161(W105(H)  BUN 6 - 20 mg/dL 18 10 12   Creatinine 0.44 - 1.00 mg/dL 9.600.76 4.540.71 0.980.81  Sodium 135 - 145 mmol/L 139 143 140  Potassium 3.5 - 5.1 mmol/L 3.9 3.5 3.6  Chloride 101 - 111 mmol/L 108 107 114(H)  CO2 22 - 32 mmol/L 24 25 21(L)  Calcium 8.9 - 10.3 mg/dL 8.1(L) 9.0 6.8(L)   Intake/Output      03/28 0701 - 03/29 0700 03/29 0701 - 03/30 0700   P.O. 1076    I.V. 0    Total Intake 1076     Urine 700    Blood     Total Output 700     Net +376          Urine Occurrence 5 x      Physical Exam: General: NAD.  Upright in bed.  Talkative. Resp: No increased wob Cardio: regular rate and rhythm ABD soft Neurologically intact MSK RLE:  Ace wrap in place. Neurovascularly intact Sensation intact distally Feet warm Dorsiflexion/Plantar flexion intact Incision: dressing C/D/I   Albina BilletHenry Calvin Martensen III, PA-C 11/13/2016, 7:16 AM

## 2016-11-13 NOTE — Progress Notes (Signed)
qPhysical Therapy Treatment Patient Details Name: Martha Buck MRN: [id number] DOB: [birth year] Today's Date: 11/13/2016    History of Present Illness R TKA, h/o lumbar fusion 2011    PT Comments    Patient is making good progress with PT.  From a mobility standpoint anticipate patient will be ready for DC home. Pt is supervision with bed mobility and min A for stability with transfers to/from RW and ambulation of 120 feet with RW. Pt encouraged to weight bear through R LE. Pt requires skilled PT for continued gait training and to improve LE strength and ROM to be safe with mobility in her home environment.      Follow Up Recommendations  Home health PT     Equipment Recommendations  Rolling walker with 5" wheels;3in1 (PT)       Precautions / Restrictions Precautions Precautions: Fall;Knee Required Braces or Orthoses: Knee Immobilizer - Right Knee Immobilizer - Right: On at all times Restrictions Weight Bearing Restrictions: Yes RLE Weight Bearing: Weight bearing as tolerated    Mobility  Bed Mobility Overal bed mobility: Needs Assistance Bed Mobility: Sit to Supine     Supine to sit: Supervision     General bed mobility comments: pt able to use bedrails to push herself up in the bed  Transfers Overall transfer level: Needs assistance Equipment used: Rolling walker (2 wheeled) Transfers: Sit to/from Stand Sit to Stand: Min assist         General transfer comment: assist to stabilize up coming to upright, pt able to slowly bear weight through her R LE   Ambulation/Gait Ambulation/Gait assistance: Min assist Ambulation Distance (Feet): 120 Feet Assistive device: Rolling walker (2 wheeled) Gait Pattern/deviations: Step-to pattern;Decreased step length - left;Decreased stance time - right;Decreased weight shift to right;Shuffle;Antalgic;Trunk flexed Gait velocity: slowed Gait velocity interpretation: Below normal speed for age/gender General Gait Details: vc for  upright posture and increased weightshift over to R LE, steady with no LoB      Balance Overall balance assessment: Needs assistance         Standing balance support: Bilateral upper extremity supported;During functional activity Standing balance-Leahy Scale: Poor Standing balance comment: will not fully weightbear through R LE without cuing                            Cognition Arousal/Alertness: Awake/alert Behavior During Therapy: Anxious;Agitated;Impulsive Overall Cognitive Status: Within Functional Limits for tasks assessed                                               Pertinent Vitals/Pain Pain Assessment: Faces Faces Pain Scale: Hurts whole lot Pain Location: R knee with mobility Pain Descriptors / Indicators: Sore;Throbbing Pain Intervention(s): Limited activity within patient's tolerance;Monitored during session;Premedicated before session           PT Goals (current goals can now be found in the care plan section) Acute Rehab PT Goals Patient Stated Goal: to walk, likes to go to high school games where her daughter plays in band PT Goal Formulation: With patient Potential to Achieve Goals: Fair    Frequency    7X/week      PT Plan Current plan remains appropriate       End of Session Equipment Utilized During Treatment: Gait belt Activity Tolerance: Patient limited by pain Patient left: in  bed;with call bell/phone within reach   PT Visit Diagnosis: Other abnormalities of gait and mobility (R26.89);Pain Pain - Right/Left: Right Pain - part of body: Knee     Time: [id number] PT Time Calculation (min) (ACUTE ONLY): 26 min  Charges:  $Gait Training: 23-37 mins                    G Codes:       Davanee Klinkner B. Beverely RisenVan Buck PT, DPT Acute Rehabilitation  [phone] Pager [phone]    Martha Buck 11/13/2016, 12:08 PM

## 2016-11-13 NOTE — Progress Notes (Signed)
qPhysical Therapy Treatment Patient Details Name: Martha HakeBarbara B Petronio MRN: [id number] DOB: 08/18/1960 Today's Date: 11/13/2016    History of Present Illness R TKA, h/o lumbar fusion 2011    PT Comments    Patient is making good progress with PT.  From a mobility standpoint anticipate patient will be ready for DC home. Pt educated and given handout on exercise program to use until HHPT arrives. Pt requests not getting out of bed after exercise as her knee "is throbbing too much". Pt requires continued skilled PT for gait training and to improve LE strength and ROM to be able to safely mobilize in her home environment.      Follow Up Recommendations  Home health PT     Equipment Recommendations  Rolling walker with 5" wheels;3in1 (PT)    Recommendations for Other Services       Precautions / Restrictions Precautions Precautions: Fall;Knee Precaution Booklet Issued: Yes (comment) Required Braces or Orthoses: Knee Immobilizer - Right Knee Immobilizer - Right: On at all times Restrictions Weight Bearing Restrictions: Yes RLE Weight Bearing: Weight bearing as tolerated       Balance Overall balance assessment: Needs assistance                                          Cognition Arousal/Alertness: Awake/alert Behavior During Therapy: Anxious;Agitated;Impulsive Overall Cognitive Status: Within Functional Limits for tasks assessed                                        Exercises Total Joint Exercises Ankle Circles/Pumps: AROM;Both;10 reps;Supine Quad Sets: AROM;Both;10 reps;Supine Gluteal Sets: AROM;Both;10 reps;Supine Short Arc Quad: AROM;Right;10 reps;Supine Heel Slides: AROM;AAROM;Right;15 reps;Supine Hip ABduction/ADduction: AROM;Right;10 reps;Supine Straight Leg Raises: AROM;Right;10 reps;Supine Goniometric ROM: -8 degrees extension measured supine in bed, 40 degrees flexion measured supine in bed         Pertinent Vitals/Pain Pain  Assessment: Faces Faces Pain Scale: Hurts little more Pain Location: R knee with mobility Pain Descriptors / Indicators: Sore;Throbbing Pain Intervention(s): Limited activity within patient's tolerance;Monitored during session        VSS         PT Goals (current goals can now be found in the care plan section) Acute Rehab PT Goals Patient Stated Goal: to walk, likes to go to high school games where her daughter plays in band PT Goal Formulation: With patient Time For Goal Achievement: 11/26/16 Potential to Achieve Goals: Fair Progress towards PT goals: Progressing toward goals    Frequency    7X/week      PT Plan Current plan remains appropriate       End of Session   Activity Tolerance: Patient limited by pain Patient left: in bed;with call bell/phone within reach Nurse Communication: Mobility status PT Visit Diagnosis: Other abnormalities of gait and mobility (R26.89);Pain Pain - Right/Left: Right Pain - part of body: Knee     Time: [id number] PT Time Calculation (min) (ACUTE ONLY): 23 min  Charges:  $Therapeutic Exercise: 23-37 mins                    G Codes:       Joshuan Bolander B. Beverely RisenVan Fleet PT, DPT Acute Rehabilitation  [phone] Pager [phone]    Elon Alaslizabeth B Van [facility] 11/13/2016, 4:38 PM

## 2016-12-27 ENCOUNTER — Emergency Department (HOSPITAL_COMMUNITY): Payer: Medicare Other

## 2016-12-27 ENCOUNTER — Emergency Department (HOSPITAL_COMMUNITY)
Admission: EM | Admit: 2016-12-27 | Discharge: 2016-12-28 | Disposition: A | Discharge status: Home / Self Care | Payer: Medicare Other | Attending: Emergency Medicine | Admitting: Emergency Medicine

## 2016-12-27 ENCOUNTER — Encounter (HOSPITAL_COMMUNITY): Payer: Self-pay | Admitting: Emergency Medicine

## 2016-12-27 DIAGNOSIS — F17210 Nicotine dependence, cigarettes, uncomplicated: Secondary | ICD-10-CM | POA: Insufficient documentation

## 2016-12-27 DIAGNOSIS — E876 Hypokalemia: Secondary | ICD-10-CM | POA: Diagnosis not present

## 2016-12-27 DIAGNOSIS — Z96651 Presence of right artificial knee joint: Secondary | ICD-10-CM | POA: Diagnosis not present

## 2016-12-27 DIAGNOSIS — Z7901 Long term (current) use of anticoagulants: Secondary | ICD-10-CM | POA: Insufficient documentation

## 2016-12-27 DIAGNOSIS — R0789 Other chest pain: Secondary | ICD-10-CM | POA: Insufficient documentation

## 2016-12-27 DIAGNOSIS — R079 Chest pain, unspecified: Secondary | ICD-10-CM | POA: Diagnosis present

## 2016-12-27 LAB — BASIC METABOLIC PANEL
ANION GAP: 9 (ref 5–15)
BUN: 11 mg/dL (ref 6–20)
CALCIUM: 8.5 mg/dL — AB (ref 8.9–10.3)
CO2: 26 mmol/L (ref 22–32)
Chloride: 106 mmol/L (ref 101–111)
Creatinine, Ser: 0.7 mg/dL (ref 0.44–1.00)
Glucose, Bld: 100 mg/dL — ABNORMAL HIGH (ref 65–99)
POTASSIUM: 2.6 mmol/L — AB (ref 3.5–5.1)
Sodium: 141 mmol/L (ref 135–145)

## 2016-12-27 LAB — I-STAT CHEM 8, ED
BUN: 15 mg/dL (ref 6–20)
Calcium, Ion: 1.03 mmol/L — ABNORMAL LOW (ref 1.15–1.40)
Chloride: 104 mmol/L (ref 101–111)
Creatinine, Ser: 0.8 mg/dL (ref 0.44–1.00)
Glucose, Bld: 99 mg/dL (ref 65–99)
HCT: 35 % — ABNORMAL LOW (ref 36.0–46.0)
Hemoglobin: 11.9 g/dL — ABNORMAL LOW (ref 12.0–15.0)
Potassium: 2.6 mmol/L — CL (ref 3.5–5.1)
Sodium: 145 mmol/L (ref 135–145)
TCO2: 29 mmol/L (ref 0–100)

## 2016-12-27 LAB — CBC
HEMATOCRIT: 36.9 % (ref 36.0–46.0)
HEMOGLOBIN: 11.7 g/dL — AB (ref 12.0–15.0)
MCH: 29.8 pg (ref 26.0–34.0)
MCHC: 31.7 g/dL (ref 30.0–36.0)
MCV: 94.1 fL (ref 78.0–100.0)
Platelets: 332 10*3/uL (ref 150–400)
RBC: 3.92 MIL/uL (ref 3.87–5.11)
RDW: 13.6 % (ref 11.5–15.5)
WBC: 10.7 10*3/uL — AB (ref 4.0–10.5)

## 2016-12-27 LAB — I-STAT TROPONIN, ED: TROPONIN I, POC: 0 ng/mL (ref 0.00–0.08)

## 2016-12-27 LAB — D-DIMER, QUANTITATIVE: D-Dimer, Quant: 1.71 ug/mL-FEU — ABNORMAL HIGH (ref 0.00–0.50)

## 2016-12-27 MED ORDER — POTASSIUM CHLORIDE 10 MEQ/100ML IV SOLN
10.0000 meq | Freq: Once | INTRAVENOUS | Status: AC
  Administered 2016-12-27: 10 meq via INTRAVENOUS
  Filled 2016-12-27: qty 100

## 2016-12-27 MED ORDER — POTASSIUM CHLORIDE CRYS ER 20 MEQ PO TBCR
80.0000 meq | EXTENDED_RELEASE_TABLET | Freq: Two times a day (BID) | ORAL | Status: DC
  Administered 2016-12-27: 80 meq via ORAL
  Filled 2016-12-27: qty 4

## 2016-12-27 MED ORDER — IOPAMIDOL (ISOVUE-370) INJECTION 76%
INTRAVENOUS | Status: AC
  Administered 2016-12-27: 100 mL
  Filled 2016-12-27: qty 100

## 2016-12-27 MED ORDER — POTASSIUM CHLORIDE CRYS ER 20 MEQ PO TBCR: 20.0000 meq | EXTENDED_RELEASE_TABLET | Freq: Two times a day (BID) | ORAL | 0 refills | Status: DC

## 2016-12-27 MED ORDER — ACETAMINOPHEN 500 MG PO TABS
1000.0000 mg | ORAL_TABLET | Freq: Once | ORAL | Status: AC
Start: 2016-12-27 — End: 2016-12-27
  Administered 2016-12-27: 1000 mg via ORAL
  Filled 2016-12-27: qty 2

## 2016-12-27 NOTE — ED Notes (Signed)
MD made aware of critical chem 8 results

## 2016-12-27 NOTE — ED Notes (Signed)
Patient transported to CT 

## 2016-12-27 NOTE — ED Notes (Signed)
Dr. Anitra LauthPlunkett aware of potassium.  Will prioritize rooming patient.

## 2016-12-27 NOTE — ED Triage Notes (Signed)
Pt states has had knee replacement on march 25th, started driving 2 days ago-- c/o pain under right breast area-- started this afternoon, had a hard time "catching breath" -- hurts to lift arm also,

## 2016-12-27 NOTE — ED Provider Notes (Signed)
[facility]-EMERGENCY DEPT Provider Note   CSN: [id number] Arrival date & time: 12/27/16  1708     History   Chief Complaint Chief Complaint  Patient presents with  . Chest Pain    HPI Martha Buck is a 56 y.o. female  who presents today with chief complaint sudden onset, constant right-sided anterior chest pain.  She states pain began a few hours ago while at rest and began with a mild throbbing headache which has improved followed by development of right-sided chest pain near the upper portion of her breast and axilla.  Pain is constant and intermittently sharp.  Laying down or lifting her arm makes it worse,  Laying still upright seems to improve it.  Pain initially presented with shortness of breath but this has resolved.  She has not tried anything for her symptoms.  She recently had a right knee replacement  March 28 and took  Xarelto for 30 days but has completed the course.  She denies any trauma or falls.  She denies numbness, tingling, weakness, syncope, abdominal pain, n/v/d.    patient states she smokes 4-5 cigarettes daily for the past few decades,  has not had any alcohol since the knee surgery,  denies other drug use.  No new medication changes.   The history is provided by the patient.    Past Medical History:  Diagnosis Date  . Depression   . DJD (degenerative joint disease) of knee    RIGHT  . History of kidney stones    hx of  . History of pneumonia   . Joint pain     Patient Active Problem List   Diagnosis Date Noted  . Primary localized osteoarthritis of right knee 11/12/2016  . S/P lumbar fusion 10/20/2016  . Primary osteoarthritis of right knee 10/20/2016  . Cholelithiasis 09/14/2015  . Pneumonia 09/14/2015  . Emesis   . Community acquired pneumonia 09/13/2015  . 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 Body mass index (BMI) of 31.0-31.9 in adult 06/26/2014    Past Surgical History:  Procedure Laterality Date  .  BACK SURGERY    . ECTOPIC PREGNANCY SURGERY  1991  . KNEE ARTHROSCOPY WITH MEDIAL MENISECTOMY Right 11/26/2012   Procedure: KNEE ARTHROSCOPY WITH PARTIAL MEDIAL MENISECTOMY,DEBRIDEMENT;  Surgeon: Javier DockerJeffrey C Beane, MD;  [facility] [place];  Service: Orthopedics;  Laterality: Right;  . LUMBAR FUSION  03-20-2010   L4 -- L5  . TOTAL KNEE ARTHROPLASTY Right 11/11/2016   Procedure: TOTAL KNEE ARTHROPLASTY;  Surgeon: Sheral Apleyimothy D Murphy, MD;  Location: [facility] OR;  Service: Orthopedics;  Laterality: Right;    OB History    No data available       Home Medications    Prior to Admission medications   Medication Sig Start Date End Date Taking? Authorizing Provider  chlorhexidine (PERIDEX) 0.12 % solution Use as directed 15 mLs in the mouth or throat 2 (two) times daily.    [provider]  docusate sodium (COLACE) 100 MG capsule Take 1 capsule (100 mg total) by mouth 2 (two) times daily. To prevent constipation while taking pain medication. 11/11/16   Albina BilletMartensen, Henry Calvin III, PA-C  gabapentin (NEURONTIN) 400 MG capsule Take 400 mg by mouth 3 (three) times daily. 10/27/16   [provider]  HYDROcodone-acetaminophen (NORCO) 10-325 MG tablet Take 1 tablet by mouth 3 (three) times daily. 10/27/16   [provider]  methocarbamol (ROBAXIN) 500 MG tablet Take 1 tablet (500 mg  total) by mouth every 6 (six) hours as needed for muscle spasms. 11/11/16   Albina Billet III, PA-C  ondansetron (ZOFRAN) 4 MG tablet Take 1 tablet (4 mg total) by mouth every 8 (eight) hours as needed for nausea or vomiting. 11/11/16   Albina Billet III, PA-C  oxyCODONE-acetaminophen (ROXICET) 5-325 MG tablet Take 1-2 tablets by mouth every 4 (four) hours as needed for severe pain. 11/11/16   Albina Billet III, PA-C  potassium chloride SA (K-DUR,KLOR-CON) 20 MEQ tablet Take 1 tablet (20 mEq total) by mouth 2 (two) times daily. 12/27/16 01/01/17  Michela Pitcher A, PA-C    rivaroxaban (XARELTO) 10 MG TABS tablet Take 1 tablet (10 mg total) by mouth daily. For 30 days for DVT prophylaxis 11/11/16   Albina Billet III, PA-C    Family History Family History  Problem Relation Age of Onset  . Diabetes Mellitus II Mother   . Hypertension Mother     Social History Social History  Substance Use Topics  . Smoking status: Current Every Day Smoker    Packs/day: 0.00    Years: 34.00    Types: Cigarettes  . Smokeless tobacco: Never Used     Comment: smokes 4 cigs per day  . Alcohol use Yes     Comment: socially     Allergies   Bee venom; Aspirin; Lyrica [pregabalin]; and Morphine and related   Review of Systems Review of Systems  Constitutional: Negative for chills and fever.  Respiratory: Positive for shortness of breath.   Cardiovascular: Positive for chest pain.  Gastrointestinal: Negative for abdominal pain, diarrhea, nausea and vomiting.  Musculoskeletal: Positive for arthralgias. Negative for back pain and neck pain.  Neurological: Positive for headaches. Negative for syncope, weakness and numbness.     Physical Exam Updated Vital Signs BP (!) 153/91   Pulse 64   Temp 98.6 F (37 C) (Oral)   Resp (!) 9   LMP 04/26/2012   SpO2 98%   Physical Exam  Constitutional: She is oriented to person, place, and time. She appears well-developed and well-nourished. No distress.  HENT:  Head: Normocephalic and atraumatic.  Eyes: Conjunctivae and EOM are normal. Right eye exhibits no discharge. Left eye exhibits no discharge. No scleral icterus.  Neck: Normal range of motion. Neck supple. No JVD present. No tracheal deviation present. No thyromegaly present.  No midline CSP ttp. No paraspinal muscle tenderness  Cardiovascular: Normal rate, regular rhythm, normal heart sounds and intact distal pulses.   2+ radial and DP/PT pulses bl, negative Homan's bl   Pulmonary/Chest: Effort normal and breath sounds normal. She exhibits tenderness.   Right anterior upper chest wall ttp laterally near the clavicle extending into the 4th-5th ribs and the axilla. No masses, swelling, erythema, or deformity noted. No paradoxical motion of the chest wall.   Musculoskeletal: She exhibits tenderness.  Full ROM of right shoulder with pain. 5/5 strength BUE. Right knee with well healed surgical incision and mild swelling. No TTP of RUE.   Lymphadenopathy:    She has no cervical adenopathy.  Neurological: She is alert and oriented to person, place, and time. No cranial nerve deficit or sensory deficit.  Fluent speech, no facial droop, sensation intact globally.   Skin: Capillary refill takes less than 2 seconds. She is not diaphoretic.  Psychiatric: She has a normal mood and affect. Her behavior is normal.     ED Treatments / Results  Labs (all labs ordered are listed, but only abnormal  results are displayed) Labs Reviewed  BASIC METABOLIC PANEL - Abnormal; Notable for the following:       Result Value   Potassium 2.6 (*)    Glucose, Bld 100 (*)    Calcium 8.5 (*)    All other components within normal limits  CBC - Abnormal; Notable for the following:    WBC 10.7 (*)    Hemoglobin 11.7 (*)    All other components within normal limits  D-DIMER, QUANTITATIVE (NOT AT [facility]) - Abnormal; Notable for the following:    D-Dimer, Quant 1.71 (*)    All other components within normal limits  I-STAT CHEM 8, ED - Abnormal; Notable for the following:    Potassium 2.6 (*)    Calcium, Ion 1.03 (*)    Hemoglobin 11.9 (*)    HCT 35.0 (*)    All other components within normal limits  I-STAT TROPOININ, ED    EKG  EKG Interpretation  Date/Time:  Saturday Dec 27 2016 17:16:55 EDT Ventricular Rate:  79 PR Interval:  172 QRS Duration: 96 QT Interval:  398 QTC Calculation: 456 R Axis:   15 Text Interpretation:  Normal sinus rhythm Minimal voltage criteria for LVH, may be normal variant Septal infarct , age undetermined No significant change since  last tracing Confirmed by [facility]  MD, WHITNEY ([id number]) on 12/27/2016 7:00:51 PM       Radiology Dg Chest 2 View  Result Date: 12/27/2016 CLINICAL DATA:  Right chest pain EXAM: CHEST  2 VIEW COMPARISON:  09/13/2015 FINDINGS: Lungs are clear.  No pleural effusion or pneumothorax. The heart is normal in size. Visualized osseous structures are within normal limits. IMPRESSION: Normal chest radiographs. Electronically Signed   By: Charline Bills M.D.   On: 12/27/2016 17:57   Ct Angio Chest Pe W Or Wo Contrast  Result Date: 12/27/2016 CLINICAL DATA:  Chest pain. EXAM: CT ANGIOGRAPHY CHEST WITH CONTRAST TECHNIQUE: Multidetector CT imaging of the chest was performed using the standard protocol during bolus administration of intravenous contrast. Multiplanar CT image reconstructions and MIPs were obtained to evaluate the vascular anatomy. CONTRAST:  80 cc Isovue 370 IV. COMPARISON:  Chest radiograph from earlier today. FINDINGS: Cardiovascular: The study is high quality for the evaluation of pulmonary embolism. There are no filling defects in the central, lobar, segmental or subsegmental pulmonary artery branches to suggest acute pulmonary embolism. Great vessels are normal in course and caliber. Normal heart size. No significant pericardial fluid/thickening. Mediastinum/Nodes: Possible hypodense 1.7 cm upper left thyroid lobe nodule. Unremarkable esophagus. No pathologically enlarged axillary, mediastinal or hilar lymph nodes. Lungs/Pleura: No pneumothorax. No pleural effusion. Subpleural 5 mm left lower lobe pulmonary nodule (series 7/image 60). Peripheral left lower lobe 5 mm solid pulmonary nodule (series 7/ image 84). No acute consolidative airspace disease, lung masses or additional significant pulmonary nodules. Upper abdomen: Unremarkable. Musculoskeletal: No aggressive appearing focal osseous lesions. Mild thoracic spondylosis. Review of the MIP images confirms the above findings. IMPRESSION: 1. No  pulmonary embolism.  No acute pulmonary disease. 2. Two 5 mm left lower lobe solid pulmonary nodules. No follow-up needed if patient is low-risk (and has no known or suspected primary neoplasm). Non-contrast chest CT can be considered in 12 months if patient is high-risk. This recommendation follows the consensus statement: Guidelines for Management of Incidental Pulmonary Nodules Detected on CT Images: From the Fleischner Society 2017; Radiology 2017; 284:228-243. 3. Suggestion of a 1.7 cm upper left thyroid lobe nodule. Recommend outpatient correlation with thyroid ultrasound. This follows  ACR consensus guidelines: Managing Incidental Thyroid Nodules Detected on Imaging: White Paper of the ACR Incidental Thyroid Findings Committee. J Am Coll Radiol 2015; 12:143-150. Electronically Signed   By: Delbert Phenix M.D.   On: 12/27/2016 23:10    Procedures Procedures (including critical care time)  Medications Ordered in ED Medications  potassium chloride SA (K-DUR,KLOR-CON) CR tablet 80 mEq (80 mEq Oral Given 12/27/16 2307)  acetaminophen (TYLENOL) tablet 1,000 mg (1,000 mg Oral Given 12/27/16 1949)  potassium chloride 10 mEq in 100 mL IVPB (10 mEq Intravenous New Bag/Given 12/27/16 2256)  iopamidol (ISOVUE-370) 76 % injection (100 mLs  Contrast Given 12/27/16 2217)     Initial Impression / Assessment and Plan / ED Course  I have reviewed the triage vital signs and the nursing notes.  Pertinent labs & imaging results that were available during my care of the patient were reviewed by me and considered in my medical decision making (see chart for details).     Patient with acute onset of sharp right-sided upper anterior chest pain, reproducible on palpation and worsened with position changes which began earlier today.  Chest x-ray negative for rib fracture, pleural effusion, pneumonia.  EKG with normal sinus rhythm with no significant changes from last,  Troponin negative.  Low suspicion of ACS or MI.   D-dimer positive,  Obtained CTA of chest which showed no evidence of PE.  However did show incidental finding of to 5 mm left lower lobe solid pulmonary nodules.  With history of smoking, discussed with patient  And recommend repeat noncontrast chest CT  In 12 months.  Also possible 1.7 cm upper left thyroid lobe nodule, discussed follow-up with PCP to obtain ultrasound.  Patient states she has follow-up visit with her PCP on Monday which I encouraged her to keep.  Patient found to have potassium of 2.6 while in ED;  Initiated IV supplementation as well as PO.  Will discharge home with additional supplementation. I suspect pain is musculoskeletal in nature.  Discussed use of ibuprofen and Tylenol for pain relief as well as ice or heat to the affected area and gentle stretching.  Follow-up with PCP on Monday for reevaluation.  Discussed strict ED return precautions. Pt verbalized understanding of and agreement with plan and is safe for discharge home at this time.  Final Clinical Impressions(s) / ED Diagnoses   Final diagnoses:  Chest pain  Hypokalemia    New Prescriptions New Prescriptions   POTASSIUM CHLORIDE SA (K-DUR,KLOR-CON) 20 MEQ TABLET    Take 1 tablet (20 mEq total) by mouth 2 (two) times daily.     Jeanie Sewer, PA-C 12/28/16 1610    Gwyneth Sprout, MD 12/28/16 2308

## 2016-12-28 NOTE — Discharge Instructions (Signed)
Take potassium supplement twice daily for the next 5 days. Take ibuprofen or Tylenol as needed for pain, apply ice or heat to the effected area, do some gentle stretching exercises while in the shower. Follow-up with your primary care as scheduled on Monday for reevaluation of your symptoms.  He may obtain an ultrasound of your thyroid for evaluation of the nodule that was found on her imaging.  I also recommend a repeat chest CT in 12 months for evaluation of the nodules found on your lungs. Return to the ED if any concerning symptoms develop.

## 2017-01-16 ENCOUNTER — Encounter (HOSPITAL_COMMUNITY): Payer: Self-pay | Admitting: Orthopedic Surgery

## 2017-01-16 NOTE — Addendum Note (Signed)
Addendum  created 01/16/17 1128 by Lareta Bruneau D, MD   Sign clinical note    

## 2017-01-20 ENCOUNTER — Encounter (HOSPITAL_BASED_OUTPATIENT_CLINIC_OR_DEPARTMENT_OTHER): Payer: Self-pay | Admitting: *Deleted

## 2017-01-21 ENCOUNTER — Encounter (HOSPITAL_BASED_OUTPATIENT_CLINIC_OR_DEPARTMENT_OTHER)
Admission: RE | Admit: 2017-01-21 | Discharge: 2017-01-21 | Disposition: A | Discharge status: Home / Self Care | Payer: Medicare Other | Source: Ambulatory Visit | Attending: Orthopedic Surgery | Admitting: Orthopedic Surgery

## 2017-01-21 DIAGNOSIS — Z01812 Encounter for preprocedural laboratory examination: Secondary | ICD-10-CM | POA: Insufficient documentation

## 2017-01-21 LAB — BASIC METABOLIC PANEL
Anion gap: 7 (ref 5–15)
BUN: 16 mg/dL (ref 6–20)
CALCIUM: 9 mg/dL (ref 8.9–10.3)
CO2: 27 mmol/L (ref 22–32)
CREATININE: 0.85 mg/dL (ref 0.44–1.00)
Chloride: 108 mmol/L (ref 101–111)
GFR calc non Af Amer: >60 mL/min (ref >60)
Glucose, Bld: 97 mg/dL (ref 65–99)
Potassium: 4.7 mmol/L (ref 3.5–5.1)
SODIUM: 142 mmol/L (ref 135–145)

## 2017-01-22 NOTE — H&P (Signed)
[facility] [address]   [address] [place], [place] [id number] [phone]  A Division of [facility]  RE: Martha BickersShittu, Martha                                  [phone]        DOB: 02/04/1961 01/07/2017  Reason for visit:  Followup right total knee arthroplasty done on 11/11/2016.    HPI: She has no systemic symptoms.  Her pain is well controlled.  She is two months out at this point.  She was initially noncompliant with CPM and physical therapy.  She refused any home PT.  She refused to start physical therapy until recently.  Understandably she is very stiff at this point.    OBJECTIVE: The patient is a well appearing female, in no apparent distress.  Range of motion is roughly 45-90 degrees.  She has well healed incision.  No swelling.  No tenderness.  Distally neurovascularly intact.    IMAGES: None today.    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  We are going to do manipulation under anesthesia followed by a course of Prednisone and aggressive physical therapy after that.      Jewel Baizeimothy D.  [name], M.D.  Electronically verified by Jewel Baizeimothy D. [name], M.D. TDM:pmw D 01/07/17 T 01/09/17

## 2017-01-26 NOTE — Anesthesia Preprocedure Evaluation (Signed)
Anesthesia Evaluation  Patient identified by MRN, date of birth, ID band Patient awake    Reviewed: Allergy & Precautions, NPO status , Patient's Chart, lab work & pertinent test results  Airway Mallampati: II  TM Distance: >3 FB Neck ROM: Full    Dental  (+) Teeth Intact, Dental Advisory Given   Pulmonary Current Smoker,    breath sounds clear to auscultation       Cardiovascular negative cardio ROS   Rhythm:Regular Rate:Normal     Neuro/Psych PSYCHIATRIC DISORDERS Depression negative neurological ROS     GI/Hepatic negative GI ROS, Neg liver ROS,   Endo/Other  negative endocrine ROS  Renal/GU negative Renal ROS  negative genitourinary   Musculoskeletal  (+) Arthritis , Osteoarthritis,    Abdominal   Peds negative pediatric ROS (+)  Hematology negative hematology ROS (+)   Anesthesia Other Findings   Reproductive/Obstetrics negative OB ROS                             Lab Results  Component Value Date   WBC 10.7 (H) 12/27/2016   HGB 11.9 (L) 12/27/2016   HCT 35.0 (L) 12/27/2016   MCV 94.1 12/27/2016   PLT 332 12/27/2016   Lab Results  Component Value Date   CREATININE 0.85 01/21/2017   BUN 16 01/21/2017   NA 142 01/21/2017   K 4.7 01/21/2017   CL 108 01/21/2017   CO2 27 01/21/2017   No results found for: INR, PROTIME   Anesthesia Physical  Anesthesia Plan  ASA: II  Anesthesia Plan: General   Post-op Pain Management:    Induction: Intravenous  PONV Risk Score and Plan: 2 and Ondansetron and Propofol  Airway Management Planned: Natural Airway and Mask  Additional Equipment:   Intra-op Plan:   Post-operative Plan:   Informed Consent: I have reviewed the patients History and Physical, chart, labs and discussed the procedure including the risks, benefits and alternatives for the proposed anesthesia with the patient or authorized representative who has indicated  his/her understanding and acceptance.   Dental advisory given  Plan Discussed with: CRNA  Anesthesia Plan Comments:         Anesthesia Quick Evaluation

## 2017-01-27 ENCOUNTER — Encounter (HOSPITAL_BASED_OUTPATIENT_CLINIC_OR_DEPARTMENT_OTHER)
Admission: RE | Disposition: A | Discharge status: Home / Self Care | Payer: Self-pay | Source: Ambulatory Visit | Attending: Orthopedic Surgery

## 2017-01-27 ENCOUNTER — Ambulatory Visit (HOSPITAL_BASED_OUTPATIENT_CLINIC_OR_DEPARTMENT_OTHER): Payer: Medicare Other | Admitting: Anesthesiology

## 2017-01-27 ENCOUNTER — Ambulatory Visit (HOSPITAL_BASED_OUTPATIENT_CLINIC_OR_DEPARTMENT_OTHER)
Admission: RE | Admit: 2017-01-27 | Discharge: 2017-01-27 | Disposition: A | Discharge status: Home / Self Care | Payer: Medicare Other | Source: Ambulatory Visit | Attending: Orthopedic Surgery | Admitting: Orthopedic Surgery

## 2017-01-27 ENCOUNTER — Encounter (HOSPITAL_BASED_OUTPATIENT_CLINIC_OR_DEPARTMENT_OTHER): Payer: Self-pay | Admitting: Anesthesiology

## 2017-01-27 DIAGNOSIS — Z9119 Patient's noncompliance with other medical treatment and regimen: Secondary | ICD-10-CM | POA: Insufficient documentation

## 2017-01-27 DIAGNOSIS — Z79899 Other long term (current) drug therapy: Secondary | ICD-10-CM | POA: Diagnosis not present

## 2017-01-27 DIAGNOSIS — Z886 Allergy status to analgesic agent status: Secondary | ICD-10-CM | POA: Insufficient documentation

## 2017-01-27 DIAGNOSIS — M1711 Unilateral primary osteoarthritis, right knee: Secondary | ICD-10-CM

## 2017-01-27 DIAGNOSIS — Z888 Allergy status to other drugs, medicaments and biological substances status: Secondary | ICD-10-CM | POA: Insufficient documentation

## 2017-01-27 DIAGNOSIS — M1990 Unspecified osteoarthritis, unspecified site: Secondary | ICD-10-CM | POA: Diagnosis not present

## 2017-01-27 DIAGNOSIS — F17200 Nicotine dependence, unspecified, uncomplicated: Secondary | ICD-10-CM | POA: Insufficient documentation

## 2017-01-27 DIAGNOSIS — F329 Major depressive disorder, single episode, unspecified: Secondary | ICD-10-CM | POA: Diagnosis not present

## 2017-01-27 DIAGNOSIS — Z91030 Bee allergy status: Secondary | ICD-10-CM | POA: Diagnosis not present

## 2017-01-27 DIAGNOSIS — Z885 Allergy status to narcotic agent status: Secondary | ICD-10-CM | POA: Diagnosis not present

## 2017-01-27 DIAGNOSIS — M24661 Ankylosis, right knee: Secondary | ICD-10-CM | POA: Diagnosis present

## 2017-01-27 HISTORY — PX: KNEE CLOSED REDUCTION: SHX995

## 2017-01-27 SURGERY — MANIPULATION, KNEE, CLOSED
Anesthesia: General | Site: Knee | Laterality: Right

## 2017-01-27 MED ORDER — PHENYLEPHRINE 40 MCG/ML (10ML) SYRINGE FOR IV PUSH (FOR BLOOD PRESSURE SUPPORT)
PREFILLED_SYRINGE | INTRAVENOUS | Status: AC
  Filled 2017-01-27: qty 10

## 2017-01-27 MED ORDER — MIDAZOLAM HCL 2 MG/2ML IJ SOLN: 1.0000 mg | INTRAMUSCULAR | Status: DC | PRN

## 2017-01-27 MED ORDER — OXYCODONE-ACETAMINOPHEN 5-325 MG PO TABS: 1.0000 | ORAL_TABLET | Freq: Four times a day (QID) | ORAL | 0 refills | Status: DC | PRN

## 2017-01-27 MED ORDER — SUCCINYLCHOLINE CHLORIDE 200 MG/10ML IV SOSY
PREFILLED_SYRINGE | INTRAVENOUS | Status: AC
  Filled 2017-01-27: qty 10

## 2017-01-27 MED ORDER — LIDOCAINE 2% (20 MG/ML) 5 ML SYRINGE
INTRAMUSCULAR | Status: AC
  Filled 2017-01-27: qty 5

## 2017-01-27 MED ORDER — ACETAMINOPHEN 500 MG PO TABS
1000.0000 mg | ORAL_TABLET | Freq: Once | ORAL | Status: AC
  Administered 2017-01-27: 1000 mg via ORAL

## 2017-01-27 MED ORDER — GABAPENTIN 300 MG PO CAPS
ORAL_CAPSULE | ORAL | Status: AC
  Filled 2017-01-27: qty 1

## 2017-01-27 MED ORDER — DEXAMETHASONE SODIUM PHOSPHATE 10 MG/ML IJ SOLN
INTRAMUSCULAR | Status: DC | PRN
  Administered 2017-01-27: 10 mg via INTRAVENOUS

## 2017-01-27 MED ORDER — BUPIVACAINE HCL (PF) 0.25 % IJ SOLN
INTRAMUSCULAR | Status: AC
  Filled 2017-01-27: qty 30

## 2017-01-27 MED ORDER — OXYCODONE HCL 5 MG/5ML PO SOLN: 5.0000 mg | Freq: Once | ORAL | Status: AC | PRN

## 2017-01-27 MED ORDER — ARTIFICIAL TEARS OPHTHALMIC OINT
TOPICAL_OINTMENT | OPHTHALMIC | Status: AC
  Filled 2017-01-27: qty 3.5

## 2017-01-27 MED ORDER — DOCUSATE SODIUM 100 MG PO CAPS: 100.0000 mg | ORAL_CAPSULE | Freq: Two times a day (BID) | ORAL | 0 refills | Status: DC

## 2017-01-27 MED ORDER — FENTANYL CITRATE (PF) 100 MCG/2ML IJ SOLN
INTRAMUSCULAR | Status: AC
  Filled 2017-01-27: qty 2

## 2017-01-27 MED ORDER — KETOROLAC TROMETHAMINE 30 MG/ML IJ SOLN
INTRAMUSCULAR | Status: AC
  Filled 2017-01-27: qty 1

## 2017-01-27 MED ORDER — ROCURONIUM BROMIDE 10 MG/ML (PF) SYRINGE
PREFILLED_SYRINGE | INTRAVENOUS | Status: AC
  Filled 2017-01-27: qty 5

## 2017-01-27 MED ORDER — LACTATED RINGERS IV SOLN
INTRAVENOUS | Status: DC
  Administered 2017-01-27: 08:00:00 via INTRAVENOUS

## 2017-01-27 MED ORDER — PROMETHAZINE HCL 25 MG/ML IJ SOLN: 6.2500 mg | INTRAMUSCULAR | Status: DC | PRN

## 2017-01-27 MED ORDER — GABAPENTIN 300 MG PO CAPS
300.0000 mg | ORAL_CAPSULE | Freq: Once | ORAL | Status: AC
  Administered 2017-01-27: 300 mg via ORAL

## 2017-01-27 MED ORDER — EPHEDRINE 5 MG/ML INJ
INTRAVENOUS | Status: AC
  Filled 2017-01-27: qty 10

## 2017-01-27 MED ORDER — LACTATED RINGERS IV SOLN: INTRAVENOUS | Status: DC

## 2017-01-27 MED ORDER — MEPERIDINE HCL 25 MG/ML IJ SOLN: 6.2500 mg | INTRAMUSCULAR | Status: DC | PRN

## 2017-01-27 MED ORDER — FENTANYL CITRATE (PF) 100 MCG/2ML IJ SOLN: 25.0000 ug | INTRAMUSCULAR | Status: DC | PRN

## 2017-01-27 MED ORDER — KETOROLAC TROMETHAMINE 30 MG/ML IJ SOLN
30.0000 mg | Freq: Once | INTRAMUSCULAR | Status: DC | PRN
  Administered 2017-01-27: 30 mg via INTRAVENOUS

## 2017-01-27 MED ORDER — ACETAMINOPHEN 500 MG PO TABS
ORAL_TABLET | ORAL | Status: AC
Start: 2017-01-27 — End: 2017-01-27
  Filled 2017-01-27: qty 2

## 2017-01-27 MED ORDER — SCOPOLAMINE 1 MG/3DAYS TD PT72: 1.0000 | MEDICATED_PATCH | Freq: Once | TRANSDERMAL | Status: DC | PRN

## 2017-01-27 MED ORDER — BUPIVACAINE-EPINEPHRINE (PF) 0.5% -1:200000 IJ SOLN
INTRAMUSCULAR | Status: AC
  Filled 2017-01-27: qty 30

## 2017-01-27 MED ORDER — ONDANSETRON HCL 4 MG/2ML IJ SOLN
INTRAMUSCULAR | Status: AC
  Filled 2017-01-27: qty 2

## 2017-01-27 MED ORDER — BUPIVACAINE HCL (PF) 0.5 % IJ SOLN
INTRAMUSCULAR | Status: AC
  Filled 2017-01-27: qty 60

## 2017-01-27 MED ORDER — DEXAMETHASONE SODIUM PHOSPHATE 10 MG/ML IJ SOLN
INTRAMUSCULAR | Status: AC
  Filled 2017-01-27: qty 1

## 2017-01-27 MED ORDER — PROPOFOL 500 MG/50ML IV EMUL
INTRAVENOUS | Status: AC
  Filled 2017-01-27: qty 50

## 2017-01-27 MED ORDER — METHYLPREDNISOLONE ACETATE 80 MG/ML IJ SUSP
INTRAMUSCULAR | Status: AC
  Filled 2017-01-27: qty 1

## 2017-01-27 MED ORDER — ONDANSETRON HCL 4 MG/2ML IJ SOLN
INTRAMUSCULAR | Status: DC | PRN
  Administered 2017-01-27: 4 mg via INTRAVENOUS

## 2017-01-27 MED ORDER — MIDAZOLAM HCL 2 MG/2ML IJ SOLN
INTRAMUSCULAR | Status: AC
  Filled 2017-01-27: qty 2

## 2017-01-27 MED ORDER — OXYCODONE HCL 5 MG PO TABS
5.0000 mg | ORAL_TABLET | Freq: Once | ORAL | Status: AC | PRN
  Administered 2017-01-27: 5 mg via ORAL

## 2017-01-27 MED ORDER — PROPOFOL 10 MG/ML IV BOLUS
INTRAVENOUS | Status: DC | PRN
  Administered 2017-01-27: 200 mg via INTRAVENOUS

## 2017-01-27 MED ORDER — FENTANYL CITRATE (PF) 100 MCG/2ML IJ SOLN
50.0000 ug | INTRAMUSCULAR | Status: AC | PRN
  Administered 2017-01-27 (×3): 50 ug via INTRAVENOUS

## 2017-01-27 MED ORDER — ONDANSETRON HCL 4 MG PO TABS
4.0000 mg | ORAL_TABLET | Freq: Three times a day (TID) | ORAL | 0 refills | Status: DC | PRN
Start: 2017-01-27 — End: 2022-02-13

## 2017-01-27 MED ORDER — CHLORHEXIDINE GLUCONATE 4 % EX LIQD: 60.0000 mL | Freq: Once | CUTANEOUS | Status: DC

## 2017-01-27 MED ORDER — OXYCODONE HCL 5 MG PO TABS
ORAL_TABLET | ORAL | Status: AC
  Filled 2017-01-27: qty 1

## 2017-01-27 SURGICAL SUPPLY — 17 items
BANDAGE ADH SHEER 1  50/CT (GAUZE/BANDAGES/DRESSINGS) IMPLANT
DECANTER SPIKE VIAL GLASS SM (MISCELLANEOUS) IMPLANT
GAUZE SPONGE 4X4 12PLY STRL LF (GAUZE/BANDAGES/DRESSINGS) IMPLANT
GLOVE BIO SURGEON STRL SZ7.5 (GLOVE) IMPLANT
GLOVE BIOGEL PI IND STRL 8 (GLOVE) IMPLANT
GLOVE BIOGEL PI INDICATOR 8 (GLOVE)
GOWN STRL REUS W/ TWL LRG LVL3 (GOWN DISPOSABLE) IMPLANT
GOWN STRL REUS W/ TWL XL LVL3 (GOWN DISPOSABLE) IMPLANT
GOWN STRL REUS W/TWL LRG LVL3 (GOWN DISPOSABLE)
GOWN STRL REUS W/TWL XL LVL3 (GOWN DISPOSABLE)
KNEE WRAP E Z 3 GEL PACK (MISCELLANEOUS) IMPLANT
NDL SAFETY ECLIPSE 18X1.5 (NEEDLE) IMPLANT
NEEDLE HYPO 18GX1.5 SHARP (NEEDLE)
NEEDLE SPNL 22GX3.5 QUINCKE BK (NEEDLE) IMPLANT
PAD ALCOHOL SWAB (MISCELLANEOUS) IMPLANT
SWABSTICK POVIDONE IODINE SNGL (MISCELLANEOUS) IMPLANT
SYR CONTROL 10ML LL (SYRINGE) IMPLANT

## 2017-01-27 NOTE — Interval H&P Note (Signed)
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 After consideration of risks, benefits and other options for treatment, the patient has consented to  Procedure(s): CLOSED MANIPULATION RIGHT KNEE (Right) as a surgical intervention .  The patient's history has been reviewed, patient examined, no change in status, stable for surgery.  I have reviewed the patient's chart and labs.  Questions were answered to the patient's satisfaction.     Nijah Tejera D

## 2017-01-27 NOTE — Discharge Instructions (Signed)
Attend Physical therapy as instructed to maintain Range of motion.  Diet: As you were doing prior to hospitalization   Activity:  Increase activity slowly as tolerated, but follow the weight bearing instructions below.  The rules on driving is that you can not be taking narcotics while you drive, and you must feel in control of the vehicle.    Weight Bearing:   As tolerated  To prevent constipation: you may use a stool softener such as -  Colace (over the counter) 100 mg by mouth twice a day  Drink plenty of fluids (prune juice may be helpful) and high fiber foods Miralax (over the counter) for constipation as needed.    Itching:  If you experience itching with your medications, try taking only a single pain pill, or even half a pain pill at a time.  You may take up to 10 pain pills per day, and you can also use benadryl over the counter for itching or also to help with sleep.   Precautions:  If you experience chest pain or shortness of breath - call 911 immediately for transfer to the hospital emergency department!!  If you develop a fever greater that 101 F, purulent drainage from wound, increased redness or drainage from wound, or calf pain -- Call the office at [phone]                                                Follow- Up Appointment:  [facility] [place] [facility] [phone]  Post Anesthesia Home Care Instructions  Activity: Get plenty of rest for the remainder of the day. A responsible individual must stay with you for 24 hours following the procedure.  For the next 24 hours, DO NOT: -Drive a car -Advertising copywriterperate machinery -Do Not Drink alcoholic beverages -Take any medication unless instructed by your physician -Make any legal decisions or sign important papers.  Meals: Start with liquid foods such as gelatin or soup. Progress to regular foods as tolerated. Avoid greasy, spicy, heavy foods. If nausea and/or vomiting occur, drink only clear  liquids until the nausea and/or vomiting subsides. Call your physician if vomiting continues.  Special Instructions/Symptoms: Your throat may feel dry or sore from the anesthesia or the breathing tube placed in your throat during surgery. If this causes discomfort, gargle with warm salt water. The discomfort should disappear within 24 hours.  If you had a scopolamine patch placed behind your ear for the management of post- operative nausea and/or vomiting:  1. The medication in the patch is effective for 72 hours, after which it should be removed.  Wrap patch in a tissue and discard in the trash. Wash hands thoroughly with soap and water. 2. You may remove the patch earlier than 72 hours if you experience unpleasant side effects which may include dry mouth, dizziness or visual disturbances. 3. Avoid touching the patch. Wash your hands with soap and water after contact with the patch.

## 2017-01-27 NOTE — Anesthesia Postprocedure Evaluation (Signed)
Anesthesia Post Note  Patient: Martha CabotBarbara B Buck  Procedure(s) Performed: Procedure(s) (LRB): CLOSED MANIPULATION RIGHT KNEE (Right)     Patient location during evaluation: PACU Anesthesia Type: General Level of consciousness: sedated and patient cooperative Pain management: pain level controlled Vital Signs Assessment: post-procedure vital signs reviewed and stable Respiratory status: spontaneous breathing Cardiovascular status: stable Anesthetic complications: no    Last Vitals:  Vitals:   01/27/17 1030 01/27/17 1128  BP:  (!) 155/83  Pulse: (!) 55 64  Resp: 13 18  Temp:  36.4 C    Last Pain:  Vitals:   01/27/17 1015  TempSrc:   PainSc: 10-Worst pain ever                 Lewie LoronJohn Tiras Bianchini

## 2017-01-27 NOTE — Op Note (Signed)
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   ANESTHESIA:   mask  PREOPERATIVE INDICATIONS:  Girtha HakeBarbara B Gouger is a  56 y.o. 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    The risks benefits and alternatives were discussed with the patient preoperatively including but not limited to the risks of infection, bleeding, nerve injury, cardiopulmonary complications, the need for revision surgery, among others, and the patient was willing to proceed.  OPERATIVE IMPLANTS: none  OPERATIVE FINDINGS: 20-45 ROM pre-op, 5-120 post op  OPERATIVE PROCEDURE:  She is identified in the preoperative holding area and marked her operative site. She was transported to the operating theater where mask anesthesia was induced.  After appropriate timeout I performed a firm manipulation under anesthesia she started at 20-45 range of motion I was able to obtain 5 -120 range of motion across her knee.  She was then given 10 mg IV Decadron. She was awoken and taken the PACU in stable condition. POST OPERATIVE PLAN: restart therapy tomorrow, Ambulate for dvt px

## 2017-01-27 NOTE — Transfer of Care (Signed)
Immediate Anesthesia Transfer of Care Note  Patient: Franki CabotBarbara B Theiler  Procedure(s) Performed: Procedure(s): CLOSED MANIPULATION RIGHT KNEE (Right)  Patient Location: PACU  Anesthesia Type:General  Level of Consciousness: awake, alert  and oriented  Airway & Oxygen Therapy: Patient Spontanous Breathing and Patient connected to face mask oxygen  Post-op Assessment: Report given to RN and Post -op Vital signs reviewed and stable  Post vital signs: Reviewed and stable  Last Vitals:  Vitals:   01/27/17 0807  BP: 113/87  Pulse: 71  Temp: 36.6 C    Last Pain:  Vitals:   01/27/17 0807  TempSrc: Oral  PainSc: 10-Worst pain ever      Patients Stated Pain Goal: 3 (01/27/17 0807)  Complications: No apparent anesthesia complications

## 2017-01-27 NOTE — Progress Notes (Signed)
Pt with elevated bp during pacu stay, very anxious and complaining of pain and anxiety,  Dr. Renold DonGermeroth made aware and in the see patient.  Toradol given after MD discussed aspirin allergy. Pt takes motrin at home without any side effects.  Okay to discharge home with elevated BP instructed pt to see primary md for workup for hypertension

## 2017-01-28 ENCOUNTER — Encounter (HOSPITAL_BASED_OUTPATIENT_CLINIC_OR_DEPARTMENT_OTHER): Payer: Self-pay | Admitting: Orthopedic Surgery

## 2017-04-22 ENCOUNTER — Emergency Department (HOSPITAL_COMMUNITY): Payer: Medicare Other

## 2017-04-22 ENCOUNTER — Encounter (HOSPITAL_COMMUNITY): Payer: Self-pay | Admitting: *Deleted

## 2017-04-22 ENCOUNTER — Emergency Department (HOSPITAL_COMMUNITY)
Admission: EM | Admit: 2017-04-22 | Discharge: 2017-04-22 | Disposition: A | Discharge status: Home / Self Care | Payer: Medicare Other | Attending: Emergency Medicine | Admitting: Emergency Medicine

## 2017-04-22 DIAGNOSIS — M25561 Pain in right knee: Secondary | ICD-10-CM | POA: Diagnosis not present

## 2017-04-22 DIAGNOSIS — F17210 Nicotine dependence, cigarettes, uncomplicated: Secondary | ICD-10-CM | POA: Diagnosis not present

## 2017-04-22 DIAGNOSIS — G8929 Other chronic pain: Secondary | ICD-10-CM

## 2017-04-22 DIAGNOSIS — Z96651 Presence of right artificial knee joint: Secondary | ICD-10-CM | POA: Insufficient documentation

## 2017-04-22 DIAGNOSIS — Z79899 Other long term (current) drug therapy: Secondary | ICD-10-CM | POA: Diagnosis not present

## 2017-04-22 MED ORDER — OXYCODONE HCL 5 MG PO TABS
5.0000 mg | ORAL_TABLET | Freq: Once | ORAL | Status: AC
  Administered 2017-04-22: 5 mg via ORAL
  Filled 2017-04-22: qty 1

## 2017-04-22 NOTE — ED Provider Notes (Signed)
[facility]-EMERGENCY DEPT Provider Note   CSN: [id number] Arrival date & time: 04/22/17  1113     History   Chief Complaint Chief Complaint  Patient presents with  . Knee Pain    HPI Martha Buck is a 56 y.o. female with hx of depression DJD, chronic back pain and right knee replacement who presents to the ED with right knee pain. Patient reports having surgery in March and then in June but states that the pain is worse. She has not taken any pain mediation. She is scheduled to start pain management next week. The patient states that her PCP is trying to get her scheduled to see an orthopedic surgeon in [place].  HPI  Past Medical History:  Diagnosis Date  . Depression   . DJD (degenerative joint disease) of knee    RIGHT  . History of kidney stones    hx of  . History of pneumonia   . Joint pain     Patient Active Problem List   Diagnosis Date Noted  . Primary localized osteoarthritis of right knee 11/12/2016  . S/P lumbar fusion 10/20/2016  . Primary osteoarthritis of right knee 10/20/2016  . Cholelithiasis 09/14/2015  . Pneumonia 09/14/2015  . Emesis   . Community acquired pneumonia 09/13/2015  . 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 Body mass index (BMI) of 31.0-31.9 in adult 06/26/2014    Past Surgical History:  Procedure Laterality Date  . BACK SURGERY    . ECTOPIC PREGNANCY SURGERY  1991  . KNEE ARTHROSCOPY WITH MEDIAL MENISECTOMY Right 11/26/2012   Procedure: KNEE ARTHROSCOPY WITH PARTIAL MEDIAL MENISECTOMY,DEBRIDEMENT;  Surgeon: Javier Docker, MD;  Location: [facility];  Service: Orthopedics;  Laterality: Right;  . KNEE CLOSED REDUCTION Right 01/27/2017   Procedure: CLOSED MANIPULATION RIGHT KNEE;  Surgeon: Sheral Apley, MD;  Location: [facility];  Service: Orthopedics;  Laterality: Right;  . LUMBAR FUSION  03-20-2010   L4 -- L5  . TOTAL KNEE ARTHROPLASTY Right 11/11/2016   Procedure:  TOTAL KNEE ARTHROPLASTY;  Surgeon: Sheral Apley, MD;  Location: [facility] OR;  Service: Orthopedics;  Laterality: Right;    OB History    No data available       Home Medications    Prior to Admission medications   Medication Sig Start Date End Date Taking? Authorizing Provider  cyclobenzaprine (FLEXERIL) 10 MG tablet Take 10 mg by mouth at bedtime. 04/02/17  Yes [provider]  gabapentin (NEURONTIN) 800 MG tablet Take 800 mg by mouth 3 (three) times daily.   Yes [provider]  ibuprofen (ADVIL,MOTRIN) 800 MG tablet Take 800 mg by mouth every 8 (eight) hours as needed for moderate pain.   Yes [provider]  meloxicam (MOBIC) 15 MG tablet Take 15 mg by mouth daily. 03/19/17  Yes [provider]  potassium chloride SA (K-DUR,KLOR-CON) 20 MEQ tablet Take 1 tablet (20 mEq total) by mouth 2 (two) times daily. 12/27/16 04/22/17 Yes Fawze, Mina A, PA-C  ondansetron (ZOFRAN) 4 MG tablet Take 1 tablet (4 mg total) by mouth every 8 (eight) hours as needed for nausea or vomiting. Patient not taking: Reported on 04/22/2017 01/27/17   Albina Billet III, PA-C    Family History Family History  Problem Relation Age of Onset  . Diabetes Mellitus II Mother   . Hypertension Mother     Social History Social History  Substance Use Topics  . Smoking status: Current  Every Day Smoker    Packs/day: 0.25    Years: 34.00    Types: Cigarettes  . Smokeless tobacco: Never Used     Comment: smokes 4 cigs per day  . Alcohol use Yes     Comment: socially     Allergies   Bee venom; Aspirin; Lyrica [pregabalin]; and Morphine and related   Review of Systems Review of Systems  Constitutional: Negative for fever.  HENT: Negative.   Respiratory: Negative for shortness of breath.   Cardiovascular: Leg swelling: right knee.  Gastrointestinal: Negative for nausea and vomiting.  Musculoskeletal: Positive for arthralgias. Back pain: chronic.       Right knee    Skin: Negative for wound.  Neurological: Negative for headaches.  Psychiatric/Behavioral: Negative for confusion.     Physical Exam Updated Vital Signs BP (!) 126/94 (BP Location: Left Arm)   Pulse 70   Temp 98.3 F (36.8 C) (Oral)   Resp 18   LMP 04/26/2012   SpO2 96%   Physical Exam  Constitutional: She is oriented to person, place, and time. She appears well-developed and well-nourished. No distress.  HENT:  Head: Normocephalic and atraumatic.  Eyes: EOM are normal.  Neck: Neck supple.  Cardiovascular: Normal rate.   Pulmonary/Chest: Effort normal.  Musculoskeletal:       Right knee: She exhibits decreased range of motion. She exhibits no deformity. Tenderness found.  Scar to the right knee from previous knee replacement without signs of infection. Right knee slightly warmer than the left. Swelling noted. Distal pulses intact.   Neurological: She is alert and oriented to person, place, and time. No cranial nerve deficit.  Skin: Skin is warm and dry.  Psychiatric: She has a normal mood and affect.  Nursing note and vitals reviewed.    ED Treatments / Results  Labs (all labs ordered are listed, but only abnormal results are displayed) Labs Reviewed - No data to display  Dr. Juleen ChinaKohut in to examine the patient and discuss x-ray results and plan of care.  Radiology Dg Knee Complete 4 Views Right  Result Date: 04/22/2017 CLINICAL DATA:  Severe right knee pain.  Right knee replacement EXAM: RIGHT KNEE - COMPLETE 4+ VIEW COMPARISON:  11/11/2016 FINDINGS: Total knee replacement in satisfactory position and alignment. No fracture or loosening. Small joint effusion. No change from the prior study. IMPRESSION: Satisfactory knee replacement with small joint effusion. No acute skeletal abnormality. Electronically Signed   By: Marlan Palauharles  Clark M.D.   On: 04/22/2017 12:43   56 y.o. female with chronic right knee pain s/p knee replacement 7 months ago stable for d/c without acute findings  on x-ray. Ace wrap applied, ice, elevation and continue home medications as directed.   Procedures Procedures (including critical care time)  Medications Ordered in ED Medications  oxyCODONE (Oxy IR/ROXICODONE) immediate release tablet 5 mg (5 mg Oral Given 04/22/17 1227)     Initial Impression / Assessment and Plan / ED Course  I have reviewed the triage vital signs and the nursing notes.  Final Clinical Impressions(s) / ED Diagnoses   Final diagnoses:  Chronic pain of right knee    New Prescriptions Current Discharge Medication List       Janne Napoleoneese, Orel Cooler M, NP 04/22/17 1345    Raeford RazorKohut, Stephen, MD 04/23/17 [phone]

## 2017-04-22 NOTE — ED Triage Notes (Signed)
Pt complains of right knee pain. Pt had right knee surgery in March and June but states pain is worse. Pt has not taken pain medication. Pt is scheduled to see pain clinic next week.

## 2017-04-22 NOTE — Discharge Instructions (Signed)
Follow up with your primary care doctor to discuss referral to orthopedic doctor.

## 2017-05-06 ENCOUNTER — Emergency Department (HOSPITAL_COMMUNITY): Payer: Medicare Other

## 2017-05-06 ENCOUNTER — Emergency Department (HOSPITAL_COMMUNITY)
Admission: EM | Admit: 2017-05-06 | Discharge: 2017-05-07 | Disposition: A | Discharge status: Home / Self Care | Payer: Medicare Other | Attending: Physician Assistant | Admitting: Physician Assistant

## 2017-05-06 ENCOUNTER — Encounter (HOSPITAL_COMMUNITY): Payer: Self-pay | Admitting: Emergency Medicine

## 2017-05-06 DIAGNOSIS — Z79899 Other long term (current) drug therapy: Secondary | ICD-10-CM | POA: Insufficient documentation

## 2017-05-06 DIAGNOSIS — M25561 Pain in right knee: Secondary | ICD-10-CM | POA: Insufficient documentation

## 2017-05-06 DIAGNOSIS — M545 Low back pain, unspecified: Secondary | ICD-10-CM

## 2017-05-06 DIAGNOSIS — Z96651 Presence of right artificial knee joint: Secondary | ICD-10-CM | POA: Insufficient documentation

## 2017-05-06 DIAGNOSIS — Z791 Long term (current) use of non-steroidal anti-inflammatories (NSAID): Secondary | ICD-10-CM | POA: Diagnosis not present

## 2017-05-06 DIAGNOSIS — G8929 Other chronic pain: Secondary | ICD-10-CM

## 2017-05-06 MED ORDER — OXYCODONE-ACETAMINOPHEN 5-325 MG PO TABS
ORAL_TABLET | ORAL | Status: AC
  Administered 2017-05-06: 1 via ORAL
  Filled 2017-05-06: qty 1

## 2017-05-06 MED ORDER — OXYCODONE-ACETAMINOPHEN 5-325 MG PO TABS
1.0000 | ORAL_TABLET | ORAL | Status: DC | PRN
  Administered 2017-05-06: 1 via ORAL

## 2017-05-06 MED ORDER — OXYCODONE-ACETAMINOPHEN 5-325 MG PO TABS
1.0000 | ORAL_TABLET | Freq: Once | ORAL | Status: AC
  Administered 2017-05-06: 1 via ORAL
  Filled 2017-05-06: qty 1
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
## 2017-05-06 NOTE — ED Triage Notes (Signed)
Pt reports lower back and R knee pain present for several weeks. Pain worsened within past week. Pt states pain 10/10. Pt has hx of back surgery and R knee replacement several years ago.

## 2017-05-06 NOTE — Discharge Instructions (Signed)
Please keep your appointment with your orthopedist. Please keep your appointment with pain management.  If you have any new falls, injuries, or if you develop a high fever, if the knee becomes red and hot, please return to the emergency department for reevaluation.

## 2018-03-22 ENCOUNTER — Encounter (HOSPITAL_COMMUNITY): Payer: Self-pay

## 2018-03-22 ENCOUNTER — Emergency Department (HOSPITAL_COMMUNITY)
Admission: EM | Admit: 2018-03-22 | Discharge: 2018-03-22 | Disposition: A | Discharge status: Home / Self Care | Payer: No Typology Code available for payment source | Attending: Emergency Medicine | Admitting: Emergency Medicine

## 2018-03-22 ENCOUNTER — Other Ambulatory Visit: Payer: Self-pay

## 2018-03-22 ENCOUNTER — Emergency Department (HOSPITAL_COMMUNITY): Payer: No Typology Code available for payment source

## 2018-03-22 DIAGNOSIS — Y998 Other external cause status: Secondary | ICD-10-CM | POA: Diagnosis not present

## 2018-03-22 DIAGNOSIS — M5030 Other cervical disc degeneration, unspecified cervical region: Secondary | ICD-10-CM | POA: Diagnosis not present

## 2018-03-22 DIAGNOSIS — F17210 Nicotine dependence, cigarettes, uncomplicated: Secondary | ICD-10-CM | POA: Insufficient documentation

## 2018-03-22 DIAGNOSIS — S161XXA Strain of muscle, fascia and tendon at neck level, initial encounter: Secondary | ICD-10-CM | POA: Insufficient documentation

## 2018-03-22 DIAGNOSIS — M25511 Pain in right shoulder: Secondary | ICD-10-CM | POA: Diagnosis not present

## 2018-03-22 DIAGNOSIS — Y939 Activity, unspecified: Secondary | ICD-10-CM | POA: Diagnosis not present

## 2018-03-22 DIAGNOSIS — S199XXA Unspecified injury of neck, initial encounter: Secondary | ICD-10-CM | POA: Diagnosis present

## 2018-03-22 DIAGNOSIS — M25461 Effusion, right knee: Secondary | ICD-10-CM | POA: Diagnosis not present

## 2018-03-22 DIAGNOSIS — Y92410 Unspecified street and highway as the place of occurrence of the external cause: Secondary | ICD-10-CM | POA: Insufficient documentation

## 2018-03-22 DIAGNOSIS — Z79899 Other long term (current) drug therapy: Secondary | ICD-10-CM | POA: Diagnosis not present

## 2018-03-22 MED ORDER — CYCLOBENZAPRINE HCL 10 MG PO TABS: 5.0000 mg | ORAL_TABLET | Freq: Two times a day (BID) | ORAL | 0 refills | Status: DC | PRN

## 2018-03-22 MED ORDER — MELOXICAM 15 MG PO TABS: 15.0000 mg | ORAL_TABLET | Freq: Every day | ORAL | 0 refills | Status: DC
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
## 2018-03-22 NOTE — ED Notes (Signed)
Attempted to discharge pt, pt is currently talking on the phone at this time and will return when phone call has ended.
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
## 2018-03-22 NOTE — Discharge Instructions (Signed)

## 2018-05-11 ENCOUNTER — Other Ambulatory Visit: Payer: Self-pay | Admitting: Specialist

## 2018-05-11 DIAGNOSIS — Z1231 Encounter for screening mammogram for malignant neoplasm of breast: Secondary | ICD-10-CM

## 2018-05-28 IMAGING — CR DG LUMBAR SPINE COMPLETE 4+V
5 series · 5 of 5 positions shown · non-contrast
Comparison: Plain film of the lumbar spine dated 02/20/2014.

CLINICAL DATA: Pt reports lower back and R knee pain present for
several weeks. Pain worsened within past week. Pt states pain [DATE].
Pt has hx of back surgery and R knee replacement several years ago

EXAM:
LUMBAR SPINE - COMPLETE 4+ VIEW

[l-spine ap]
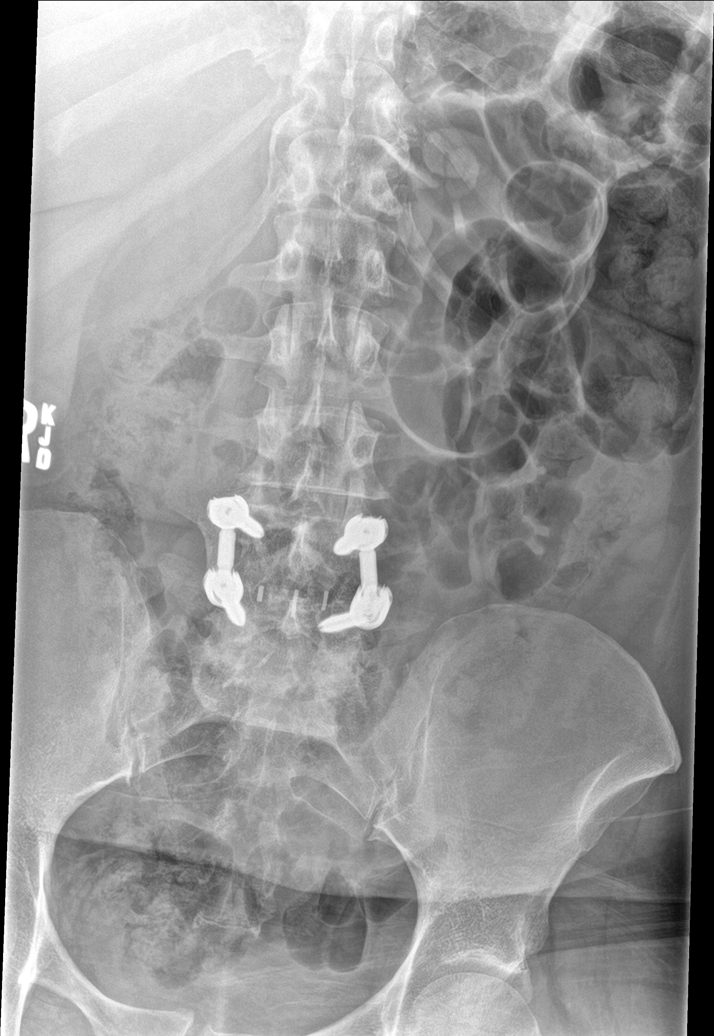

[l-spine obl (1 of 2)]
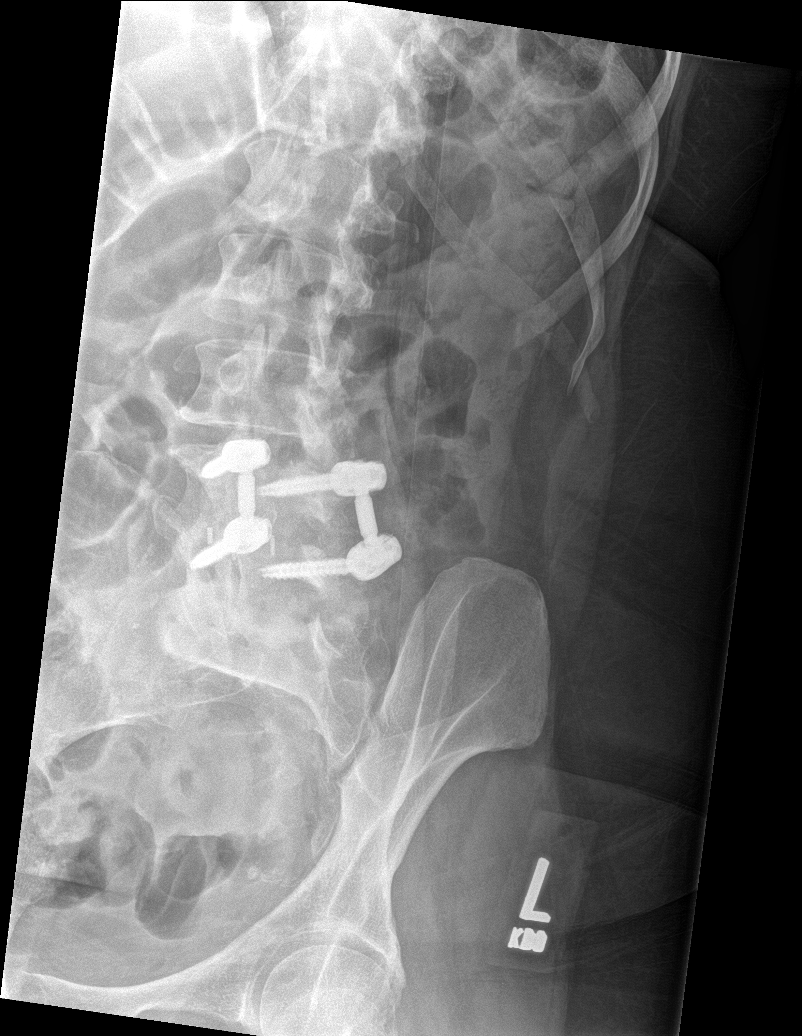

[l-spine obl (2 of 2)]
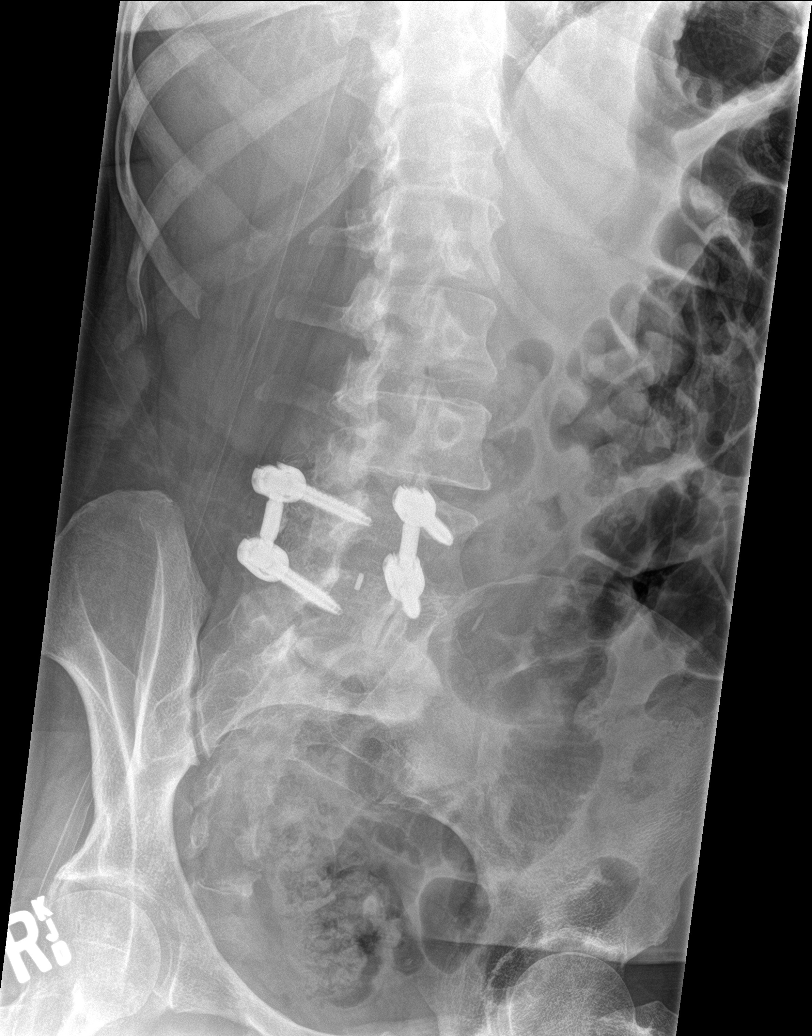

[l-spine lat]
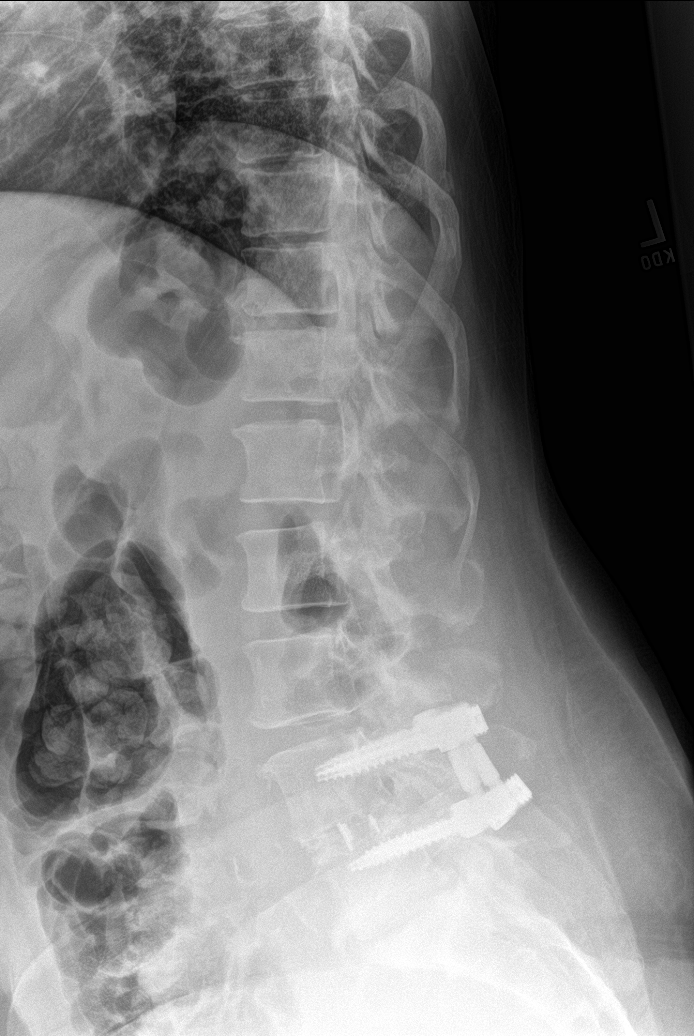

[l-spine spot]
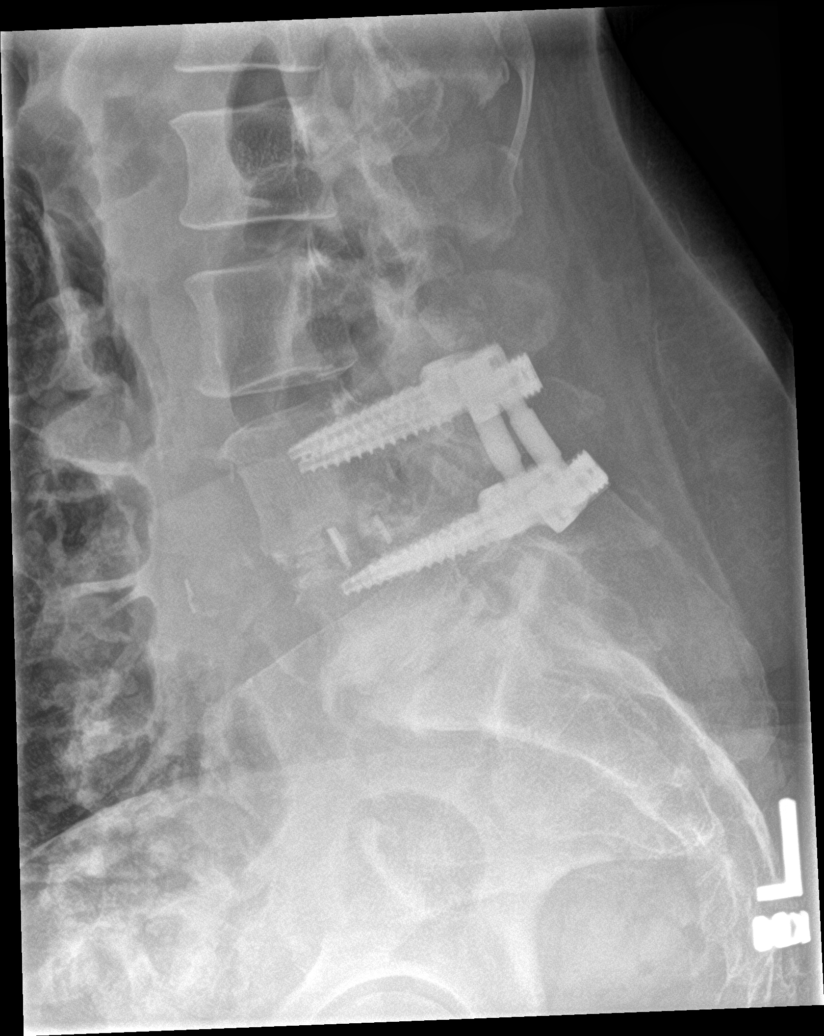

[5 of 5 positions shown; findings below may reference images not displayed]

FINDINGS: Fixation hardware at the L4-L5 level appears intact and stable in
alignment, with intervening disc spacers/cage. Osseous alignment
appears stable. No acute or suspicious osseous finding. Visualized
paravertebral soft tissues are unremarkable.

Worsened disc desiccation at L5-S1.
IMPRESSION: 1. No acute findings.
2. Fixation hardware within the lower lumbar spine appears intact
and stable alignment.
3. Worsening disc desiccation at L5-S1, with associated osseous
spurring, of uncertain clinical significance. The disc desiccation
at this level does appear similar to the appearance on interval MRI
of 05/14/2016. Consider nonemergent follow-up MRI to exclude
clinically significant interval change.

## 2018-05-28 IMAGING — CR DG KNEE COMPLETE 4+V*R*
4 series · 4 of 4 positions shown · non-contrast
Comparison: Plain film of the right knee dated 04/22/2017. Plain
film of the right knee dated 11/11/2016.

CLINICAL DATA: Pt reports lower back and R knee pain present for
several weeks. Pain worsened within past week. Pt states pain [DATE].
Pt has hx of back surgery and R knee replacement several years ago

EXAM:
RIGHT KNEE - COMPLETE 4+ VIEW

[knee ap]
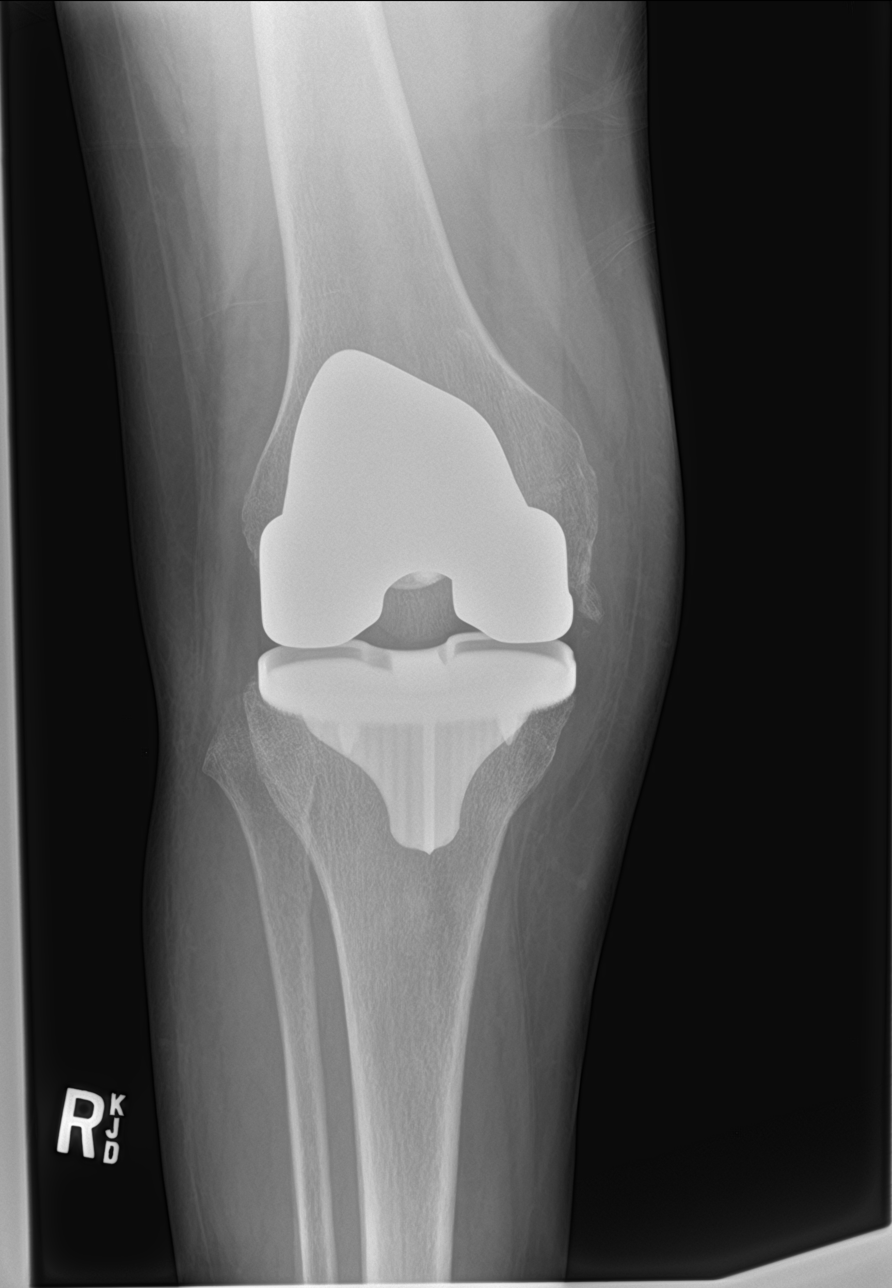

[knee lat]
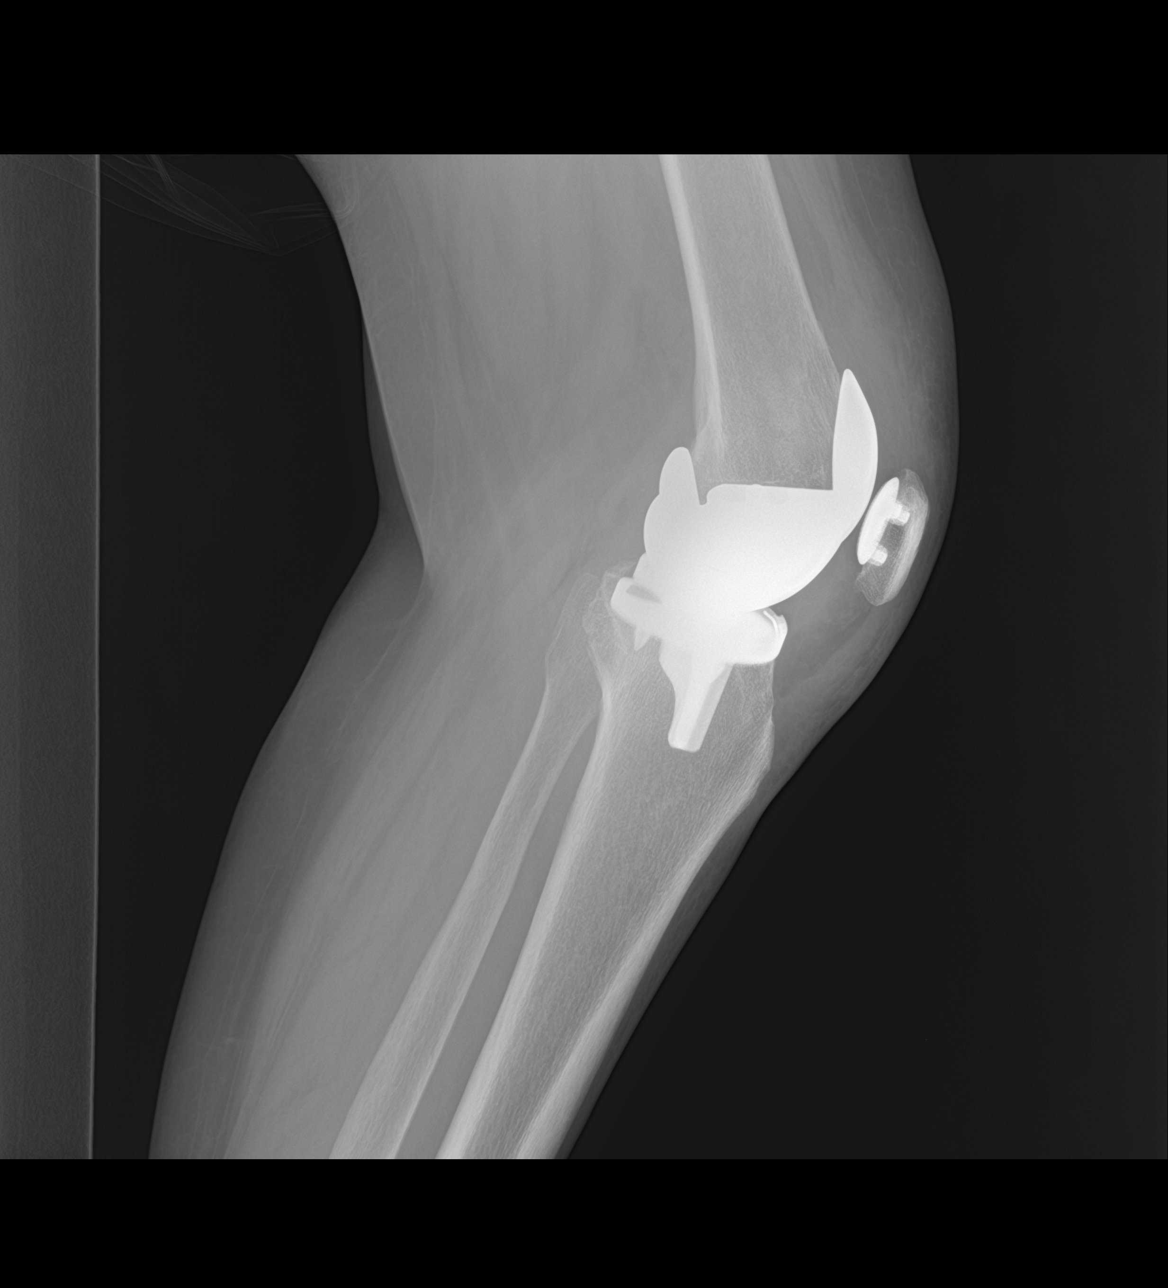

[knee obl (1 of 2)]
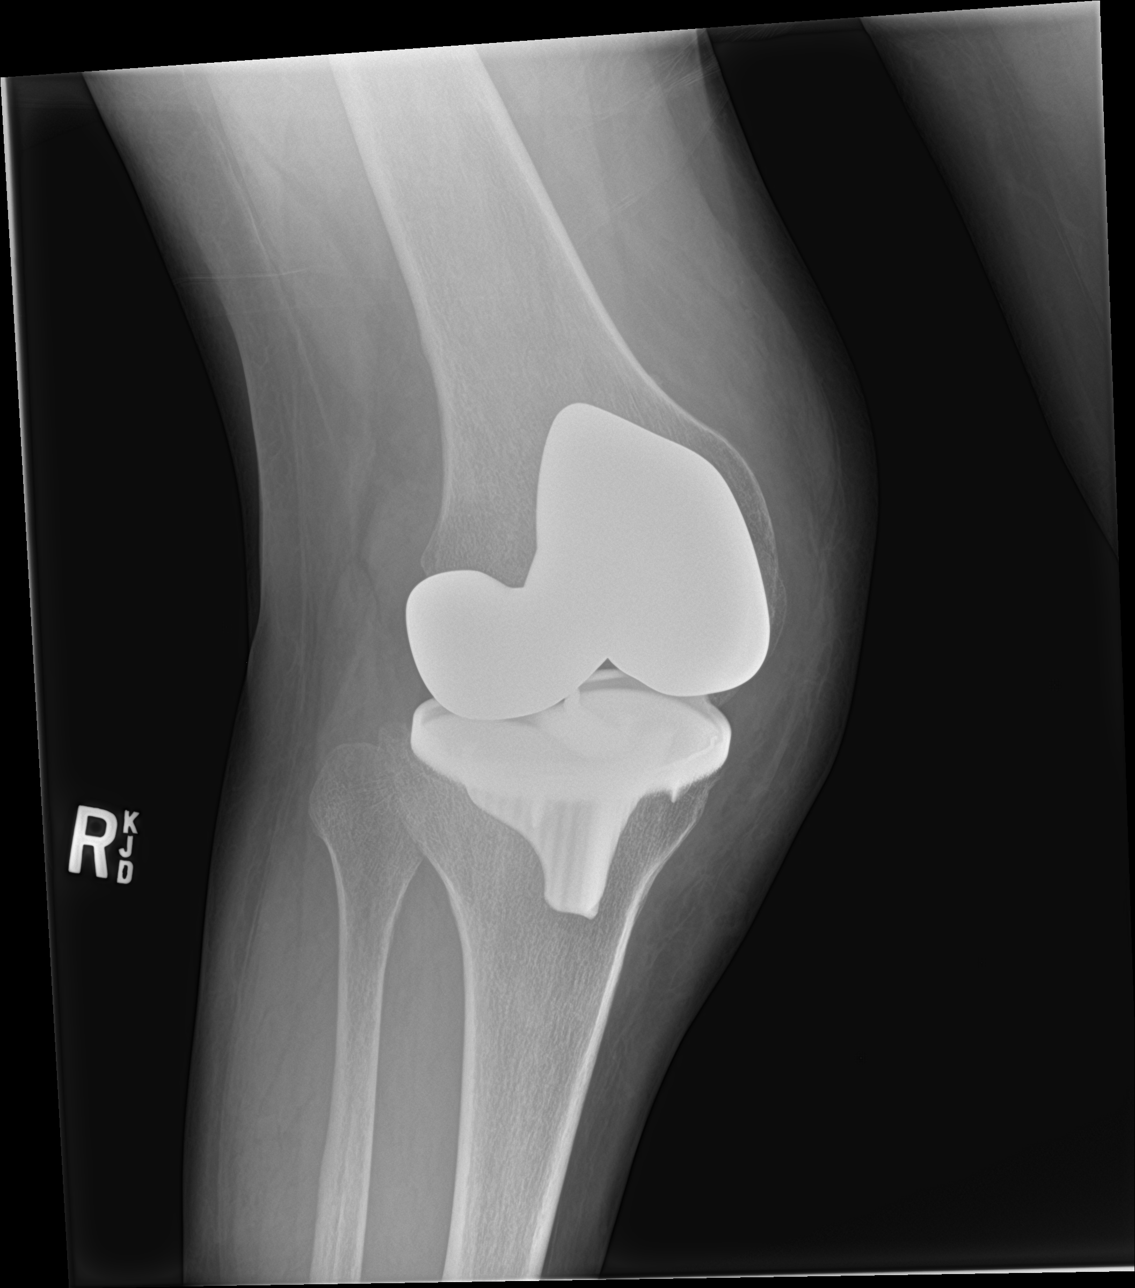

[knee obl (2 of 2)]
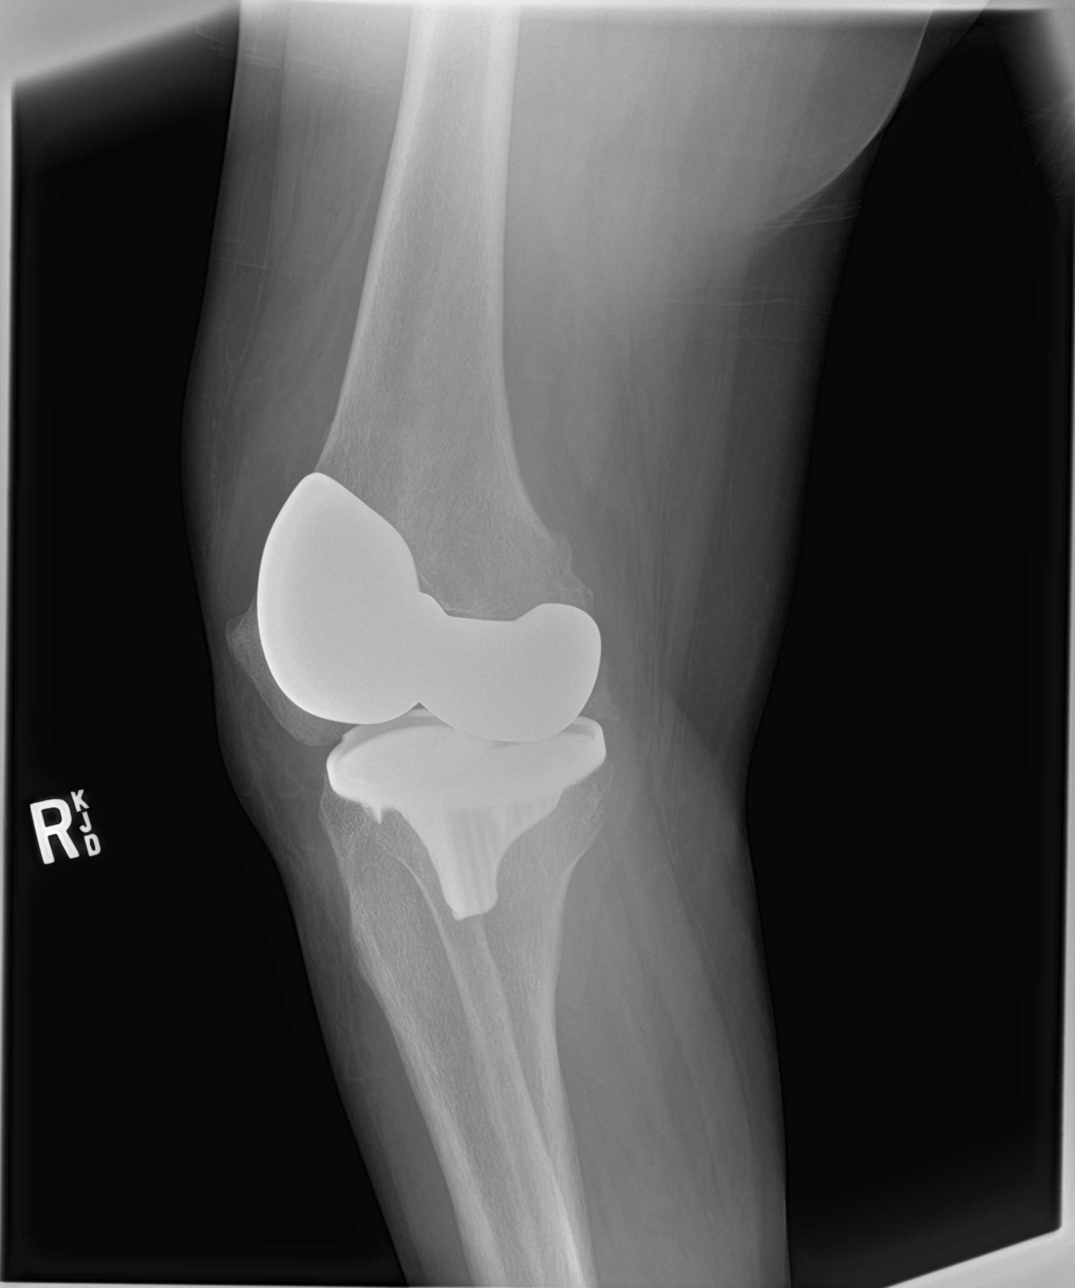

[4 of 4 positions shown; findings below may reference images not displayed]

FINDINGS: Right knee total arthroplasty hardware appears intact and
appropriately positioned, stable alignment compared to the earlier
exam. No evidence of loosening or other complicating feature.

No acute appearing osseous abnormality. Probable small chronic joint
effusion. Superficial soft tissues are unremarkable.
IMPRESSION: 1. No acute findings.
2. Arthroplasty hardware appears intact and stable in alignment.

## 2018-06-07 ENCOUNTER — Ambulatory Visit: Payer: Self-pay

## 2018-07-12 ENCOUNTER — Ambulatory Visit
Admission: RE | Admit: 2018-07-12 | Discharge: 2018-07-12 | Disposition: A | Discharge status: Home / Self Care | Payer: Medicare Other | Source: Ambulatory Visit | Attending: Specialist | Admitting: Specialist

## 2018-07-12 DIAGNOSIS — Z1231 Encounter for screening mammogram for malignant neoplasm of breast: Secondary | ICD-10-CM

## 2019-03-01 ENCOUNTER — Encounter (HOSPITAL_COMMUNITY): Payer: Self-pay | Admitting: Emergency Medicine

## 2019-03-01 ENCOUNTER — Emergency Department (HOSPITAL_COMMUNITY)
Admission: EM | Admit: 2019-03-01 | Discharge: 2019-03-01 | Disposition: A | Discharge status: Home / Self Care | Payer: Medicare Other | Attending: Emergency Medicine | Admitting: Emergency Medicine

## 2019-03-01 ENCOUNTER — Other Ambulatory Visit: Payer: Self-pay

## 2019-03-01 DIAGNOSIS — G8929 Other chronic pain: Secondary | ICD-10-CM | POA: Diagnosis not present

## 2019-03-01 DIAGNOSIS — Z79899 Other long term (current) drug therapy: Secondary | ICD-10-CM | POA: Insufficient documentation

## 2019-03-01 DIAGNOSIS — F17210 Nicotine dependence, cigarettes, uncomplicated: Secondary | ICD-10-CM | POA: Diagnosis not present

## 2019-03-01 DIAGNOSIS — M25561 Pain in right knee: Secondary | ICD-10-CM | POA: Diagnosis not present

## 2019-03-01 LAB — BASIC METABOLIC PANEL
Anion gap: 11 (ref 5–15)
BUN: 13 mg/dL (ref 6–20)
CO2: 21 mmol/L — ABNORMAL LOW (ref 22–32)
Calcium: 8.8 mg/dL — ABNORMAL LOW (ref 8.9–10.3)
Chloride: 107 mmol/L (ref 98–111)
Creatinine, Ser: 0.97 mg/dL (ref 0.44–1.00)
GFR calc Af Amer: >60 mL/min (ref >60)
GFR calc non Af Amer: >60 mL/min (ref >60)
Glucose, Bld: 117 mg/dL — ABNORMAL HIGH (ref 70–99)
Potassium: 4 mmol/L (ref 3.5–5.1)
Sodium: 139 mmol/L (ref 135–145)

## 2019-03-01 LAB — CBC
HCT: 44.6 % (ref 36.0–46.0)
Hemoglobin: 14.3 g/dL (ref 12.0–15.0)
MCH: 31.8 pg (ref 26.0–34.0)
MCHC: 32.1 g/dL (ref 30.0–36.0)
MCV: 99.1 fL (ref 80.0–100.0)
Platelets: 272 10*3/uL (ref 150–400)
RBC: 4.5 MIL/uL (ref 3.87–5.11)
RDW: 13.5 % (ref 11.5–15.5)
WBC: 8.8 10*3/uL (ref 4.0–10.5)
nRBC: 0 % (ref 0.0–0.2)

## 2019-03-01 NOTE — Discharge Instructions (Signed)
We recommend follow-up with your primary care doctor and orthopedist regarding your ongoing and worsening leg and knee pain.  Continue your daily prescribed medications, especially your prescription for muscle relaxers and anti-inflammatories.  You may return to the ED for any new or concerning symptoms.

## 2019-03-01 NOTE — ED Notes (Signed)
Pt discharged with all belongings. Discharge instructions reviewed with pt, and pt verbalized understanding. Opportunity for questions provided.  

## 2019-03-01 NOTE — ED Provider Notes (Signed)
[facility] EMERGENCY DEPARTMENT Provider Note   CSN: [id number] Arrival date & time: 03/01/19  1703     History   Chief Complaint Chief Complaint  [facility]    HPI Martha Buck is a 58 y.o. female.     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  States that pain will radiate into her thigh.  She feels as though it travels through her back and all the way up her right side to her neck.  Patient ambulates with a walker at baseline.  Feels that her left leg has been causing her more discomfort lately due to her need to accommodate her right leg.  Has continued to go to physical therapy.  Is followed by pain management and taking her daily Suboxone.  Is also on muscle relaxers and anti-inflammatories.  Does not feel as though her medications are helping her.  She has not had any recent trauma, injury, fall.  No increased erythema, heat to touch, drainage from the knee.  Patient denies fevers.  [facility]  Last follow up was in April.  The history is provided by the patient. No language interpreter was used.  Muscle Pain    Past Medical History:  Diagnosis Date  . Depression   . DJD (degenerative joint disease) of knee    RIGHT  . History of kidney stones    hx of  . History of pneumonia   . Joint pain     Patient Active Problem List   Diagnosis Date Noted  . Primary localized osteoarthritis of right knee 11/12/2016  . S/P lumbar fusion 10/20/2016  . Primary osteoarthritis of right knee 10/20/2016  . Cholelithiasis 09/14/2015  . Pneumonia 09/14/2015  . Emesis   . Community acquired pneumonia 09/13/2015  . 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 Body mass index (BMI) of 31.0-31.9 in adult 06/26/2014    Past  Surgical History:  Procedure Laterality Date  . BACK SURGERY    . ECTOPIC PREGNANCY SURGERY  1991  . KNEE ARTHROSCOPY WITH MEDIAL MENISECTOMY Right 11/26/2012   Procedure: KNEE ARTHROSCOPY WITH PARTIAL MEDIAL MENISECTOMY,DEBRIDEMENT;  Surgeon: Javier DockerJeffrey C Beane, MD;  Location: [facility] [place];  Service: Orthopedics;  Laterality: Right;  . KNEE CLOSED REDUCTION Right 01/27/2017   Procedure: CLOSED MANIPULATION RIGHT KNEE;  Surgeon: Sheral ApleyMurphy, Timothy D, MD;  Location: [facility];  Service: Orthopedics;  Laterality: Right;  . LUMBAR FUSION  03-20-2010   L4 -- L5  . TOTAL KNEE ARTHROPLASTY Right 11/11/2016   Procedure: TOTAL KNEE ARTHROPLASTY;  Surgeon: Sheral ApleyMurphy, Timothy D, MD;  [facility];  Service: Orthopedics;  Laterality: Right;     OB History   No obstetric history on file.      Home Medications    Prior to Admission medications   Medication Sig Start Date End Date Taking? Authorizing Provider  cyclobenzaprine (FLEXERIL) 10 MG tablet Take 0.5-1 tablets (5-10 mg total) by mouth 2 (two) times daily as needed for muscle spasms. 03/22/18   Harris, Cammy CopaAbigail, PA-C  gabapentin (NEURONTIN) 800 MG tablet Take 800 mg by mouth 3 (three) times daily.    [provider]  meloxicam (MOBIC) 15 MG tablet Take 1 tablet (15 mg total) by mouth daily. 03/22/18   Arthor CaptainHarris, Abigail, PA-C  ondansetron (ZOFRAN) 4 MG tablet Take 1 tablet (4 mg total) by mouth every 8 (eight) hours as needed for nausea or vomiting. Patient not taking: Reported on 04/22/2017 01/27/17   Prudencio Burly III, PA-C  potassium chloride SA (K-DUR,KLOR-CON) 20 MEQ tablet Take 1 tablet (20 mEq total) by mouth 2 (two) times daily. 12/27/16 04/22/17  Renita Papa, PA-C    Family History Family History  Problem Relation Age of Onset  . Diabetes Mellitus II Mother   . Hypertension Mother     Social History Social History   Tobacco Use  . Smoking status: Current Every Day Smoker    Packs/day: 0.25     Years: 34.00    Pack years: 8.50    Types: Cigarettes  . Smokeless tobacco: Never Used  . Tobacco comment: smokes 4 cigs per day  Substance Use Topics  . Alcohol use: Yes    Comment: socially  . Drug use: No     Allergies   Bee venom, Aspirin, Lyrica [pregabalin], and Morphine and related   Review of Systems Review of Systems Ten systems reviewed and are negative for acute change, except as noted in the HPI.    Physical Exam Updated Vital Signs BP 121/77   Pulse (!) 58   Temp 98.4 F (36.9 C) (Oral)   Resp 18   LMP 04/26/2012   SpO2 100%   Physical Exam Vitals signs and nursing note reviewed.  Constitutional:      General: She is not in acute distress.    Appearance: She is well-developed. She is not diaphoretic.     Comments: Nontoxic appearing and in NAD  HENT:     Head: Normocephalic and atraumatic.  Eyes:     General: No scleral icterus.    Conjunctiva/sclera: Conjunctivae normal.  Neck:     Musculoskeletal: Normal range of motion.  Cardiovascular:     Rate and Rhythm: Normal rate and regular rhythm.     Pulses: Normal pulses.     Comments: DP pulse 2+ bilaterally Pulmonary:     Effort: Pulmonary effort is normal. No respiratory distress.     Comments: Respirations even and unlabored Musculoskeletal:     Comments: There is chronic flexion of the R knee; hx of flexion contracture of this knee. Well healed midline incision overlying the R knee/patella with swelling of the R knee. No dehiscence, erythema, significant heat to touch, lymphangitic streaking.  No palpable effusion.   Skin:    General: Skin is warm and dry.     Coloration: Skin is not pale.     Findings: No erythema or rash.  Neurological:     Mental Status: She is alert and oriented to person, place, and time.     Coordination: Coordination normal.     Comments: Sensation to light touch intact in the BLE. There is 5/5 strength against resistance in the LLE. RLE strength exam is limited by  chronic contracture, but preserved. Patient ambulatory with steady gait. Ambulates with walker at baseline.  Psychiatric:        Behavior: Behavior normal.      ED Treatments / Results  Labs (all labs ordered are listed, but only abnormal results are displayed) Labs Reviewed  BASIC METABOLIC PANEL - Abnormal; Notable for the following components:      Result Value   CO2 21 (*)    Glucose, Bld 117 (*)    Calcium 8.8 (*)    All other components within normal limits  CBC    EKG None  Radiology No results found.  Procedures Procedures (including critical care time)  Medications Ordered in ED Medications - No data to display   Initial Impression / Assessment and Plan / ED Course  I have reviewed the triage vital signs and the nursing notes.  Pertinent labs & imaging results that were available during my care of the patient were reviewed by me and considered in my medical decision making (see chart for details).        58 year old female presenting for acute exacerbation of chronic right knee and lower extremity pain.  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  Has undergone 3 prior knee surgeries.  Is neurovascularly intact on exam without concern for septic joint.  Extremity is soft, compressible.  No swelling to the distal extremity.  I do not feel her symptoms are consistent with a DVT.  I have encouraged the patient to follow-up with her orthopedic specialist.  Discussed our inability to assist with any additional pain control as she is already on Suboxone prescribed by pain management.  Has maximized her therapies with topical Voltaren as well as oral anti-inflammatories and muscle relaxers.  She verbalizes understanding and expresses thanks for care received.  Is comfortable with plan for outpatient follow-up.  Return precautions discussed and provided. Patient discharged in stable condition with no unaddressed concerns.   Final Clinical Impressions(s) /  ED Diagnoses   Final diagnoses:  Chronic pain of right knee    ED Discharge Orders    None       Antony MaduraHumes, Veer Elamin, PA-C 03/03/19 0528    Gerhard MunchLockwood, Robert, MD 03/07/19 1919

## 2019-03-01 NOTE — ED Triage Notes (Signed)
Pt is tearful in triage reports she had pain that began in her right thigh and and went into right lower leg that started this morning. Pt reports eating mustard and gabapentin to try and help with muscle pain- pt states then she had pain all over from her neck into left arm and leg.

## 2019-03-01 NOTE — ED Notes (Signed)
Patient verbalizes understanding of discharge instructions. Opportunity for questioning and answers were provided. Armband removed by staff, pt discharged from ED in wheelchair.  

## 2019-03-08 ENCOUNTER — Other Ambulatory Visit: Payer: Self-pay

## 2019-03-08 ENCOUNTER — Encounter (HOSPITAL_COMMUNITY): Payer: Self-pay

## 2019-03-08 ENCOUNTER — Ambulatory Visit (HOSPITAL_COMMUNITY)
Admission: EM | Admit: 2019-03-08 | Discharge: 2019-03-08 | Disposition: A | Discharge status: Home / Self Care | Payer: Medicare Other | Attending: Family Medicine | Admitting: Family Medicine

## 2019-03-08 DIAGNOSIS — R509 Fever, unspecified: Secondary | ICD-10-CM | POA: Diagnosis not present

## 2019-03-08 DIAGNOSIS — R6889 Other general symptoms and signs: Secondary | ICD-10-CM | POA: Diagnosis present

## 2019-03-08 DIAGNOSIS — Z20822 Contact with and (suspected) exposure to covid-19: Secondary | ICD-10-CM

## 2019-03-08 DIAGNOSIS — Z20828 Contact with and (suspected) exposure to other viral communicable diseases: Secondary | ICD-10-CM | POA: Diagnosis not present

## 2019-03-08 DIAGNOSIS — R51 Headache: Secondary | ICD-10-CM | POA: Diagnosis not present

## 2019-03-08 NOTE — ED Triage Notes (Signed)
Patient presents to Urgent Care with complaints of fever and headache since 3 days ago. Patient reports she would like a covid test.

## 2019-03-08 NOTE — Discharge Instructions (Addendum)
Your COVID test are pending.  You should self quarantine until your cover test results are back and are negative.    Go to the emergency department if you develop shortness of breath or severe diarrhea.    Return here or go to your primary care provider if you develop cough, rash, vomiting, diarrhea, or other concerning symptoms.

## 2019-03-08 NOTE — ED Provider Notes (Signed)
[place]    CSN: [id number] Arrival date & time: 03/08/19  1325     History   Chief Complaint Chief Complaint  Patient presents with  . Fever  . Headache    HPI Martha Buck is a 58 y.o. female.   Patient presents with fever and headache x3 days.  She did not have a thermometer to check her temperature but felt warm.  She states her daughter is a Dietitian; she is requesting a COVID test.  She denies cough, shortness of breath, diarrhea, rash.  The history is provided by the patient.    Past Medical History:  Diagnosis Date  . Depression   . DJD (degenerative joint disease) of knee    RIGHT  . History of kidney stones    hx of  . History of pneumonia   . Joint pain     Patient Active Problem List   Diagnosis Date Noted  . Primary localized osteoarthritis of right knee 11/12/2016  . S/P lumbar fusion 10/20/2016  . Primary osteoarthritis of right knee 10/20/2016  . Cholelithiasis 09/14/2015  . Pneumonia 09/14/2015  . Emesis   . Community acquired pneumonia 09/13/2015  . 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 Body mass index (BMI) of 31.0-31.9 in adult 06/26/2014    Past Surgical History:  Procedure Laterality Date  . BACK SURGERY    . ECTOPIC PREGNANCY SURGERY  1991  . KNEE ARTHROSCOPY WITH MEDIAL MENISECTOMY Right 11/26/2012   Procedure: KNEE ARTHROSCOPY WITH PARTIAL MEDIAL MENISECTOMY,DEBRIDEMENT;  Surgeon: Johnn Hai, MD;  Location: [facility];  Service: Orthopedics;  Laterality: Right;  . KNEE CLOSED REDUCTION Right 01/27/2017   Procedure: CLOSED MANIPULATION RIGHT KNEE;  Surgeon: Renette Butters, MD;  Location: [facility];  Service: Orthopedics;  Laterality: Right;  . LUMBAR FUSION  03-20-2010   L4 -- L5  . TOTAL KNEE ARTHROPLASTY Right 11/11/2016   Procedure: TOTAL KNEE ARTHROPLASTY;  Surgeon: Renette Butters, MD;  Location: [facility];  Service: Orthopedics;   Laterality: Right;    OB History   No obstetric history on file.      Home Medications    Prior to Admission medications   Medication Sig Start Date End Date Taking? Authorizing Provider  cyclobenzaprine (FLEXERIL) 10 MG tablet Take 0.5-1 tablets (5-10 mg total) by mouth 2 (two) times daily as needed for muscle spasms. 03/22/18   Harris, Vernie Shanks, PA-C  gabapentin (NEURONTIN) 800 MG tablet Take 800 mg by mouth 3 (three) times daily.    [provider]  meloxicam (MOBIC) 15 MG tablet Take 1 tablet (15 mg total) by mouth daily. 03/22/18   Harris, Abigail, PA-C  ondansetron (ZOFRAN) 4 MG tablet Take 1 tablet (4 mg total) by mouth every 8 (eight) hours as needed for nausea or vomiting. Patient not taking: Reported on 04/22/2017 01/27/17   Prudencio Burly III, PA-C  potassium chloride SA (K-DUR,KLOR-CON) 20 MEQ tablet Take 1 tablet (20 mEq total) by mouth 2 (two) times daily. 12/27/16 04/22/17  Renita Papa, PA-C    Family History Family History  Problem Relation Age of Onset  . Diabetes Mellitus II Mother   . Hypertension Mother     Social History Social History   Tobacco Use  . Smoking status: Current Every Day Smoker    Packs/day: 0.25    Years: 34.00    Pack years: 8.50    Types: Cigarettes  . Smokeless tobacco:  Never Used  . Tobacco comment: smokes 4 cigs per day  Substance Use Topics  . Alcohol use: Yes    Comment: socially  . Drug use: No     Allergies   Bee venom, Aspirin, Lyrica [pregabalin], and Morphine and related   Review of Systems Review of Systems  Constitutional: Positive for fever. Negative for chills.  HENT: Negative for congestion, ear pain, rhinorrhea and sore throat.   Eyes: Negative for pain and visual disturbance.  Respiratory: Negative for cough and shortness of breath.   Cardiovascular: Negative for chest pain and palpitations.  Gastrointestinal: Negative for abdominal pain, diarrhea and vomiting.  Genitourinary: Negative for  dysuria and hematuria.  Musculoskeletal: Negative for arthralgias and back pain.  Skin: Negative for color change and rash.  Neurological: Positive for headaches. Negative for seizures and syncope.  All other systems reviewed and are negative.    Physical Exam Triage Vital Signs ED Triage Vitals  Enc Vitals Group     BP 03/08/19 1342 106/62     Pulse Rate 03/08/19 1342 83     Resp 03/08/19 1342 17     Temp 03/08/19 1342 98.1 F (36.7 C)     Temp Source 03/08/19 1342 Oral     SpO2 03/08/19 1342 99 %     Weight --      Height --      Head Circumference --      Peak Flow --      Pain Score 03/08/19 1341 0     Pain Loc --      Pain Edu? --      Excl. in GC? --    No data found.  Updated Vital Signs BP 106/62 (BP Location: Right Arm)   Pulse 83   Temp 98.1 F (36.7 C) (Oral)   Resp 17   LMP 04/26/2012   SpO2 99%   Visual Acuity Right Eye Distance:   Left Eye Distance:   Bilateral Distance:    Right Eye Near:   Left Eye Near:    Bilateral Near:     Physical Exam Vitals signs and nursing note reviewed.  Constitutional:      General: She is not in acute distress.    Appearance: She is well-developed.  HENT:     Head: Normocephalic and atraumatic.     Mouth/Throat:     Mouth: Mucous membranes are moist.  Eyes:     Conjunctiva/sclera: Conjunctivae normal.  Neck:     Musculoskeletal: Neck supple.  Cardiovascular:     Rate and Rhythm: Normal rate and regular rhythm.  Pulmonary:     Effort: Pulmonary effort is normal. No respiratory distress.     Breath sounds: Normal breath sounds.  Abdominal:     Palpations: Abdomen is soft.     Tenderness: There is no abdominal tenderness. There is no guarding or rebound.  Skin:    General: Skin is warm and dry.  Neurological:     Mental Status: She is alert.      UC Treatments / Results  Labs (all labs ordered are listed, but only abnormal results are displayed) Labs Reviewed  NOVEL CORONAVIRUS, NAA (HOSPITAL  ORDER, SEND-OUT TO REF LAB)    EKG   Radiology No results found.  Procedures Procedures (including critical care time)  Medications Ordered in UC Medications - No data to display  Initial Impression / Assessment and Plan / UC Course  I have reviewed the triage vital signs and the nursing notes.  Pertinent labs & imaging results that were available during my care of the patient were reviewed by me and considered in my medical decision making (see chart for details).   Suspected COVID.  COVID test performed here and pending.  Discussed with patient that she should self quarantine until her cover test are back and are negative.  Discussed that she should go to the emergency department if she develops high fever, shortness of breath, severe diarrhea.  Discussed that she should return here or follow-up with her PCP if she develops cough, rash, vomiting, mild diarrhea, or other concerning symptoms.     Final Clinical Impressions(s) / UC Diagnoses   Final diagnoses:  Suspected Covid-19 Virus Infection     Discharge Instructions     Your COVID test are pending.  You should self quarantine until your cover test results are back and are negative.    Go to the emergency department if you develop shortness of breath or severe diarrhea.    Return here or go to your primary care provider if you develop cough, rash, vomiting, diarrhea, or other concerning symptoms.        ED Prescriptions    None     Controlled Substance Prescriptions [place] Controlled Substance Registry consulted? Not Applicable   Mickie Bailate, Wrenna Saks H, NP 03/08/19 1400

## 2019-03-10 LAB — NOVEL CORONAVIRUS, NAA (HOSP ORDER, SEND-OUT TO REF LAB; TAT 18-24 HRS): SARS-CoV-2, NAA: NOT DETECTED

## 2019-03-14 ENCOUNTER — Telehealth (HOSPITAL_COMMUNITY): Payer: Self-pay | Admitting: Emergency Medicine

## 2019-03-14 NOTE — Telephone Encounter (Signed)
Pt called asking about covid test, results given. All questions answered

## 2020-02-14 ENCOUNTER — Other Ambulatory Visit: Payer: Self-pay | Admitting: General Practice

## 2020-02-14 DIAGNOSIS — Z1231 Encounter for screening mammogram for malignant neoplasm of breast: Secondary | ICD-10-CM

## 2020-03-01 ENCOUNTER — Ambulatory Visit
Admission: RE | Admit: 2020-03-01 | Discharge: 2020-03-01 | Disposition: A | Discharge status: Home / Self Care | Payer: Medicare Other | Source: Ambulatory Visit | Attending: General Practice | Admitting: General Practice

## 2020-03-01 ENCOUNTER — Other Ambulatory Visit: Payer: Self-pay

## 2020-03-01 DIAGNOSIS — Z1231 Encounter for screening mammogram for malignant neoplasm of breast: Secondary | ICD-10-CM

## 2020-06-03 ENCOUNTER — Other Ambulatory Visit: Payer: Self-pay

## 2020-06-03 ENCOUNTER — Encounter (HOSPITAL_COMMUNITY): Payer: Self-pay | Admitting: *Deleted

## 2020-06-03 ENCOUNTER — Ambulatory Visit (HOSPITAL_COMMUNITY)
Admission: EM | Admit: 2020-06-03 | Discharge: 2020-06-03 | Disposition: A | Discharge status: Home / Self Care | Payer: Medicare Other | Attending: Emergency Medicine | Admitting: Emergency Medicine

## 2020-06-03 DIAGNOSIS — Z791 Long term (current) use of non-steroidal anti-inflammatories (NSAID): Secondary | ICD-10-CM | POA: Diagnosis not present

## 2020-06-03 DIAGNOSIS — Z20822 Contact with and (suspected) exposure to covid-19: Secondary | ICD-10-CM | POA: Diagnosis not present

## 2020-06-03 DIAGNOSIS — Z79899 Other long term (current) drug therapy: Secondary | ICD-10-CM | POA: Diagnosis not present

## 2020-06-03 DIAGNOSIS — M1711 Unilateral primary osteoarthritis, right knee: Secondary | ICD-10-CM | POA: Insufficient documentation

## 2020-06-03 DIAGNOSIS — J069 Acute upper respiratory infection, unspecified: Secondary | ICD-10-CM | POA: Insufficient documentation

## 2020-06-03 DIAGNOSIS — H65113 Acute and subacute allergic otitis media (mucoid) (sanguinous) (serous), bilateral: Secondary | ICD-10-CM

## 2020-06-03 DIAGNOSIS — F17210 Nicotine dependence, cigarettes, uncomplicated: Secondary | ICD-10-CM | POA: Diagnosis not present

## 2020-06-03 DIAGNOSIS — H65193 Other acute nonsuppurative otitis media, bilateral: Secondary | ICD-10-CM | POA: Insufficient documentation

## 2020-06-03 DIAGNOSIS — R059 Cough, unspecified: Secondary | ICD-10-CM | POA: Diagnosis present

## 2020-06-03 MED ORDER — FLUTICASONE PROPIONATE 50 MCG/ACT NA SUSP: 1.0000 | Freq: Every day | NASAL | 0 refills | Status: AC

## 2020-06-03 MED ORDER — AMOXICILLIN-POT CLAVULANATE 875-125 MG PO TABS: 1.0000 | ORAL_TABLET | Freq: Two times a day (BID) | ORAL | 0 refills | Status: AC

## 2020-06-03 MED ORDER — CETIRIZINE HCL 10 MG PO CAPS: 10.0000 mg | ORAL_CAPSULE | Freq: Every day | ORAL | 0 refills | Status: DC

## 2020-06-03 MED ORDER — BENZONATATE 200 MG PO CAPS: 200.0000 mg | ORAL_CAPSULE | Freq: Three times a day (TID) | ORAL | 0 refills | Status: AC | PRN

## 2020-06-03 NOTE — ED Notes (Signed)
Pt refused to continue with triage questions. Assigned pt to room. Assigned nurse will complete triage in room.

## 2020-06-03 NOTE — Discharge Instructions (Signed)
Covid test pending, monitor my chart for results Begin Augmentin twice daily for the next week to cover sinus infection/ear infection Daily cetirizine and Flonase nasal spray to further help with congestion and drainage Tessalon every 8 hours for cough Rest and fluids Follow-up if not improving or worsening

## 2020-06-03 NOTE — ED Provider Notes (Signed)
[facility]    CSN: [id number] Arrival date & time: 06/03/20  1507      History   Chief Complaint No chief complaint on file. Cough, Congestion  HPI Martha Buck is a 59 y.o. female presenting today for evaluation of URI symptoms body aches and sinus pressure.  Last reports over the past 3 days she has had rhinorrhea, cough, sinus pressure aches and headache.  She has felt very fatigued and low energy.  Denies any known fractures.  Has had Covid vaccines.   Denies sick contacts.  Denies chest pain or shortness of breath.  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 06/26/2014   Body mass index (BMI) of 31.0-31.9 in adult 06/26/2014    Past Surgical History:  Procedure Laterality Date   BACK SURGERY     ECTOPIC PREGNANCY SURGERY  1991   KNEE ARTHROSCOPY WITH MEDIAL MENISECTOMY Right 11/26/2012   Procedure: KNEE ARTHROSCOPY WITH PARTIAL MEDIAL MENISECTOMY,DEBRIDEMENT;  Surgeon: Javier Docker, MD;  Location: [facility];  Service: Orthopedics;  Laterality: Right;   KNEE CLOSED REDUCTION Right 01/27/2017   Procedure: CLOSED MANIPULATION RIGHT KNEE;  Surgeon: Sheral Apley, MD;  Location: [facility];  Service: Orthopedics;  Laterality: Right;   LUMBAR FUSION  03-20-2010   L4 -- L5   TOTAL KNEE ARTHROPLASTY Right 11/11/2016   Procedure: TOTAL KNEE ARTHROPLASTY;  Surgeon: Sheral Apley, MD;  Location: [facility];  Service: Orthopedics;  Laterality: Right;    OB History   No obstetric history on file.      Home Medications    Prior to Admission medications   Medication Sig Start Date End Date Taking? Authorizing Provider  cyclobenzaprine (FLEXERIL) 10 MG tablet Take 0.5-1 tablets (5-10 mg total) by mouth 2 (two) times daily as needed for muscle spasms. 03/22/18  Yes Harris, Abigail, PA-C  gabapentin (NEURONTIN) 800 MG tablet Take 800 mg by mouth 3 (three) times daily.   Yes [provider]  meloxicam (MOBIC) 15 MG tablet Take 1 tablet (15 mg total) by mouth daily. 03/22/18  Yes Harris, Abigail, PA-C  amoxicillin-clavulanate (AUGMENTIN) 875-125 MG tablet Take 1 tablet by mouth every 12 (twelve) hours for 7 days. 06/03/20 06/10/20  Runa Whittingham C, PA-C  benzonatate (TESSALON) 200 MG capsule Take 1 capsule (200 mg total) by mouth 3 (three) times daily as needed for up to 7 days for cough. 06/03/20 06/10/20  Kaan Tosh C, PA-C  Cetirizine HCl 10 MG CAPS Take 1 capsule (10 mg total) by mouth daily for 10 days. 06/03/20 06/13/20  Elen Acero C, PA-C  fluticasone (FLONASE) 50 MCG/ACT nasal spray Place 1-2 sprays into both nostrils daily. 06/03/20   Chondra Boyde C, PA-C  ondansetron (ZOFRAN) 4 MG tablet Take 1 tablet (4 mg total) by mouth every 8 (eight) hours as needed for nausea or vomiting. Patient not taking: Reported on  04/22/2017 01/27/17   Albina Billet III, PA-C  potassium chloride SA (K-DUR,KLOR-CON) 20 MEQ tablet Take 1 tablet (20 mEq total) by mouth 2 (two) times daily. 12/27/16 04/22/17  Jeanie Sewer, PA-C    Family History Family History  Problem Relation Age of Onset   Diabetes Mellitus II Mother    Hypertension Mother     Social History Social History   Tobacco Use   Smoking status: Current Every Day Smoker    Packs/day: 0.25    Years: 34.00    Pack years: 8.50    Types: Cigarettes   Smokeless tobacco: Never Used   Tobacco comment: smokes 4  cigs per day  Vaping Use   Vaping Use: Never used  Substance Use Topics   Alcohol use: Yes    Comment: socially   Drug use: No     Allergies   Bee venom, Aspirin, Lyrica [pregabalin], and Morphine and related   Review of Systems Review of Systems  Constitutional: Positive for chills and fatigue. Negative for activity change, appetite change and fever.  HENT: Positive for congestion, postnasal drip and rhinorrhea. Negative for ear pain, sinus pressure, sore throat and trouble swallowing.   Eyes: Negative for discharge and redness.  Respiratory: Positive for cough. Negative for chest tightness and shortness of breath.   Cardiovascular: Negative for chest pain.  Gastrointestinal: Negative for abdominal pain, diarrhea, nausea and vomiting.  Musculoskeletal: Positive for myalgias.  Skin: Negative for rash.  Neurological: Negative for dizziness, light-headedness and headaches.     Physical Exam Triage Vital Signs ED Triage Vitals  Enc Vitals Group     BP      Pulse      Resp      Temp      Temp src      SpO2      Weight      Height      Head Circumference      Peak Flow      Pain Score      Pain Loc      Pain Edu?      Excl. in GC?    No data found.  Updated Vital Signs BP 128/86    Pulse 74    Temp 97.9 F (36.6 C) (Oral)    Resp 18    LMP 04/26/2012    SpO2 100%   Visual Acuity Right Eye Distance:   Left Eye Distance:   Bilateral Distance:    Right Eye Near:   Left Eye Near:    Bilateral Near:     Physical Exam Vitals and nursing note reviewed.  Constitutional:      Appearance: She is well-developed.     Comments: No acute distress  HENT:     Head: Normocephalic and atraumatic.     Ears:     Comments: Bilateral TMs opaque and irregular, mildly erythematous      Nose: Nose normal.     Mouth/Throat:     Comments: Oral mucosa pink and moist, no tonsillar enlargement or exudate. Posterior pharynx patent and nonerythematous, no uvula deviation or  swelling. Normal phonation. Eyes:     Conjunctiva/sclera: Conjunctivae normal.  Cardiovascular:     Rate and Rhythm: Normal rate.  Pulmonary:     Effort: Pulmonary effort is normal. No respiratory distress.     Comments: Breathing comfortably at rest, CTABL, no wheezing, rales or other adventitious sounds auscultated Abdominal:     General: There is no distension.  Musculoskeletal:        General: Normal range of motion.     Cervical back: Neck supple.  Skin:    General: Skin is warm and dry.  Neurological:     Mental Status: She is alert and oriented to person, place, and time.      UC Treatments / Results  Labs (all labs ordered are listed, but only abnormal results are displayed) Labs Reviewed  SARS CORONAVIRUS 2 (TAT 6-24 HRS)    EKG   Radiology No results found.  Procedures Procedures (including critical care time)  Medications Ordered in UC Medications - No data to display  Initial Impression / Assessment and Plan / UC Course  I have reviewed the triage vital signs and the nursing notes.  Pertinent labs & imaging results that were available during my care of the patient were reviewed by me and considered in my medical decision making (see chart for details).  Clinical Course as of Jun 05 1055  Sun Jun 03, 2020  1622 128/94   [HW]    Clinical Course User Index [HW] Jaz Laningham C, PA-C    3 days of URI symptoms, suspect likely viral etiology, but given appearance of ears opting to go ahead and treat with Augmentin to cover otitis media/sinusitis.  Flonase and Zyrtec for further congestion relief, Tessalon for cough, Covid test pending.  Discussed strict return precautions. Patient verbalized understanding and is agreeable with plan.  Final Clinical Impressions(s) / UC Diagnoses   Final diagnoses:  Viral URI with cough  Acute mucoid otitis media of both ears     Discharge Instructions     Covid test pending, monitor my chart for  results Begin Augmentin twice daily for the next week to cover sinus infection/ear infection Daily cetirizine and Flonase nasal spray to further help with congestion and drainage Tessalon every 8 hours for cough Rest and fluids Follow-up if not improving or worsening    ED Prescriptions    Medication Sig Dispense Auth. Provider   amoxicillin-clavulanate (AUGMENTIN) 875-125 MG tablet Take 1 tablet by mouth every 12 (twelve) hours for 7 days. 14 tablet Quince Santana C, PA-C   fluticasone (FLONASE) 50 MCG/ACT nasal spray Place 1-2 sprays into both nostrils daily. 16 g Krystena Reitter C, PA-C   Cetirizine HCl 10 MG CAPS Take 1 capsule (10 mg total) by mouth daily for 10 days. 10 capsule Caelie Remsburg C, PA-C   benzonatate (TESSALON) 200 MG capsule Take 1 capsule (200 mg total) by mouth 3 (three) times daily as needed for up to 7 days for cough. 28 capsule Jilda Kress, [place] C, PA-C     PDMP not reviewed this encounter.   Lew Dawes, PA-C 06/04/20 1058

## 2020-06-04 LAB — SARS CORONAVIRUS 2 (TAT 6-24 HRS): SARS Coronavirus 2: NEGATIVE

## 2020-07-04 ENCOUNTER — Other Ambulatory Visit (HOSPITAL_COMMUNITY): Payer: Self-pay | Admitting: General Practice

## 2020-07-04 DIAGNOSIS — R011 Cardiac murmur, unspecified: Secondary | ICD-10-CM

## 2020-07-06 ENCOUNTER — Ambulatory Visit (HOSPITAL_COMMUNITY): Payer: Medicare Other | Attending: Internal Medicine

## 2020-07-06 ENCOUNTER — Other Ambulatory Visit: Payer: Self-pay

## 2020-07-06 DIAGNOSIS — R011 Cardiac murmur, unspecified: Secondary | ICD-10-CM | POA: Diagnosis present

## 2020-07-06 LAB — ECHOCARDIOGRAM COMPLETE
Area-P 1/2: 3.12 cm2
S' Lateral: 2.8 cm

## 2020-11-16 ENCOUNTER — Ambulatory Visit: Payer: Medicare Other | Admitting: Orthopaedic Surgery

## 2020-11-23 ENCOUNTER — Ambulatory Visit: Payer: Medicare Other | Admitting: Orthopaedic Surgery

## 2020-12-06 ENCOUNTER — Other Ambulatory Visit: Payer: Self-pay

## 2020-12-06 ENCOUNTER — Ambulatory Visit (INDEPENDENT_AMBULATORY_CARE_PROVIDER_SITE_OTHER): Payer: Medicare Other | Admitting: Orthopaedic Surgery

## 2020-12-06 ENCOUNTER — Encounter: Payer: Self-pay | Admitting: Orthopaedic Surgery

## 2020-12-06 ENCOUNTER — Ambulatory Visit: Payer: Self-pay

## 2020-12-06 VITALS — Ht 68.5 in | Wt 214.0 lb

## 2020-12-06 DIAGNOSIS — M1712 Unilateral primary osteoarthritis, left knee: Secondary | ICD-10-CM | POA: Diagnosis not present

## 2020-12-06 MED ORDER — LIDOCAINE HCL 1 % IJ SOLN
2.0000 mL | INTRAMUSCULAR | Status: AC | PRN
  Administered 2020-12-06: 2 mL

## 2020-12-06 MED ORDER — METHYLPREDNISOLONE ACETATE 40 MG/ML IJ SUSP
40.0000 mg | INTRAMUSCULAR | Status: AC | PRN
  Administered 2020-12-06: 40 mg via INTRA_ARTICULAR

## 2020-12-06 MED ORDER — BUPIVACAINE HCL 0.5 % IJ SOLN
2.0000 mL | INTRAMUSCULAR | Status: AC | PRN
  Administered 2020-12-06: 2 mL via INTRA_ARTICULAR

## 2020-12-06 NOTE — Progress Notes (Signed)
Office Visit Note   Patient: Martha Buck           Date of Birth: 26-Apr-1961           MRN: [id number] Visit Date: 12/06/2020              Requested by: Karl Ito, DO [address] [place],  [place] [address] PCP: Karl Ito, DO   Assessment & Plan: Visit Diagnoses:  1. Primary osteoarthritis of left knee     Plan: In terms of the left knee she underwent MRI in December after she saw Dr. Josefine Class at [facility] in consultation.  Based on my review of the records it does not look like she followed up with Dr. Andrey Campanile after the MRI.  At this time she agreed to a cortisone injection which will hopefully provide her with some relief.  Visco injection was also discussed today.  She will use over-the-counter Voltaren gel.  Knee brace with during activity.  She has my card if she ever needs to get back in touch with me.  Total face to face encounter time was greater than 45 minutes and over half of this time was spent in counseling and/or coordination of care.  Follow-Up Instructions: Return if symptoms worsen or fail to improve.   Orders:  Orders Placed This Encounter  Procedures  . XR KNEE 3 VIEW LEFT   No orders of the defined types were placed in this encounter.     Procedures: Large Joint Inj: L knee on 12/06/2020 7:54 PM Details: 22 G needle Medications: 2 mL bupivacaine 0.5 %; 2 mL lidocaine 1 %; 40 mg methylPREDNISolone acetate 40 MG/ML Outcome: tolerated well, no immediate complications Patient was prepped and draped in the usual sterile fashion.       Clinical Data: No additional findings.   Subjective: Chief Complaint  Patient presents with  . Left Knee - Pain    Martha Buck is a 60 year old female who comes in for evaluation of of chronic left knee pain mainly on the medial side for about a year.  She endorses giving way and popping with worsening pain at night.  She is in chronic pain management and takes oxycodone and Cymbalta.  She had a  right total knee replacement in 2018 by Dr. Renaye Rakers which later needed manipulation.  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  She has needed to walk with a rolling walker since then.  In terms of the left knee she feels that that she experiences some locking and catching but mainly chronic pain.   Review of Systems  Constitutional: Negative.   HENT: Negative.   Eyes: Negative.   Respiratory: Negative.   Cardiovascular: Negative.   Endocrine: Negative.   Musculoskeletal: Negative.   Neurological: Negative.   Hematological: Negative.   Psychiatric/Behavioral: Negative.   All other systems reviewed and are negative.    Objective: Vital Signs: Ht 5' 8.5" (1.74 m)   Wt 214 lb (97.1 kg)   LMP 04/26/2012   BMI 32.07 kg/m   Physical Exam Vitals and nursing note reviewed.  Constitutional:      Appearance: She is well-developed.  HENT:     Head: Normocephalic and atraumatic.  Pulmonary:     Effort: Pulmonary effort is normal.  Abdominal:     Palpations: Abdomen is soft.  Musculoskeletal:     Cervical back: Neck supple.  Skin:  General: Skin is warm.     Capillary Refill: Capillary refill takes less than 2 seconds.  Neurological:     Mental Status: She is alert and oriented to person, place, and time.  Psychiatric:        Behavior: Behavior normal.        Thought Content: Thought content normal.        Judgment: Judgment normal.     Ortho Exam Left knee shows near normal range of motion with mild pain.  Slight medial joint line tenderness.  Collaterals and cruciates are stable.  Negative [place].  Trace effusion. Specialty Comments:  No specialty comments available.  Imaging: XR KNEE 3 VIEW LEFT  Result Date: 12/06/2020 Mild tricompartmental OA of the left knee.  Status post right total knee replacement with stemmed femoral component.    PMFS  History: Patient Active Problem List   Diagnosis Date Noted  . Primary osteoarthritis of left knee 12/06/2020  . Primary localized osteoarthritis of right knee 11/12/2016  . S/P lumbar fusion 10/20/2016  . Primary osteoarthritis of right knee 10/20/2016  . Cholelithiasis 09/14/2015  . Pneumonia 09/14/2015  . Emesis   . Community acquired pneumonia 09/13/2015  . 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 Body mass index (BMI) of 31.0-31.9 in adult 06/26/2014   Past Medical History:  Diagnosis Date  . Depression   . DJD (degenerative joint disease) of knee    RIGHT  . History of kidney stones    hx of  . History of pneumonia   . Joint pain     Family History  Problem Relation Age of Onset  . Diabetes Mellitus II Mother   . Hypertension Mother     Past Surgical History:  Procedure Laterality Date  . BACK SURGERY    . ECTOPIC PREGNANCY SURGERY  1991  . KNEE ARTHROSCOPY WITH MEDIAL MENISECTOMY Right 11/26/2012   Procedure: KNEE ARTHROSCOPY WITH PARTIAL MEDIAL MENISECTOMY,DEBRIDEMENT;  Surgeon: Javier Docker, MD;  Location: [facility];  Service: Orthopedics;  Laterality: Right;  . KNEE CLOSED REDUCTION Right 01/27/2017   Procedure: CLOSED MANIPULATION RIGHT KNEE;  Surgeon: Sheral Apley, MD;  Location: [facility];  Service: Orthopedics;  Laterality: Right;  . LUMBAR FUSION  03-20-2010   L4 -- L5  . TOTAL KNEE ARTHROPLASTY Right 11/11/2016   Procedure: TOTAL KNEE ARTHROPLASTY;  Surgeon: Sheral Apley, MD;  Location: [facility];  Service: Orthopedics;  Laterality: Right;   Social History   Occupational History  . Not on file  Tobacco Use  . Smoking status: Current Every Day Smoker    Packs/day: 0.25    Years: 34.00    Pack years: 8.50    Types: Cigarettes  . Smokeless tobacco: Never Used  . Tobacco comment: smokes 4 cigs per day  Vaping Use  . Vaping Use: Never used  Substance and Sexual Activity  .  Alcohol use: Yes    Comment: socially  . Drug use: No  . Sexual activity: Not on file

## 2021-03-14 ENCOUNTER — Other Ambulatory Visit: Payer: Self-pay

## 2021-03-14 ENCOUNTER — Ambulatory Visit (HOSPITAL_COMMUNITY)
Admission: EM | Admit: 2021-03-14 | Discharge: 2021-03-14 | Disposition: A | Discharge status: Home / Self Care | Payer: Medicare Other

## 2021-03-14 NOTE — ED Notes (Signed)
Pt was present today for a Covid test but stated that she tested positive for covid last night. Dr.Lamptey explained to patient that she did not need another test since she tested positive at home. Pt decided to go elsewhere to be tested.

## 2021-03-17 ENCOUNTER — Emergency Department (HOSPITAL_COMMUNITY)
Admission: EM | Admit: 2021-03-17 | Discharge: 2021-03-17 | Disposition: A | Discharge status: Home / Self Care | Payer: Medicare Other | Attending: Emergency Medicine | Admitting: Emergency Medicine

## 2021-03-17 ENCOUNTER — Other Ambulatory Visit: Payer: Self-pay

## 2021-03-17 DIAGNOSIS — Z96651 Presence of right artificial knee joint: Secondary | ICD-10-CM | POA: Insufficient documentation

## 2021-03-17 DIAGNOSIS — U071 COVID-19: Secondary | ICD-10-CM | POA: Diagnosis not present

## 2021-03-17 DIAGNOSIS — R059 Cough, unspecified: Secondary | ICD-10-CM | POA: Diagnosis present

## 2021-03-17 DIAGNOSIS — F17210 Nicotine dependence, cigarettes, uncomplicated: Secondary | ICD-10-CM | POA: Insufficient documentation

## 2021-03-17 DIAGNOSIS — Z20822 Contact with and (suspected) exposure to covid-19: Secondary | ICD-10-CM

## 2021-03-17 NOTE — Discharge Instructions (Addendum)
Your covid results are pending.  You can see the final result in MyChart. COVID is a virus, will need to be treated symptomatically. Use Tylenol or ibuprofen as needed for fever, body ache, headache, sore throat. Make sure you are staying well-hydrated with water. Return to the emergency room with any new, worsening, concerning symptoms

## 2021-03-17 NOTE — ED Triage Notes (Signed)
Pt states she was exposed to covid and wants test

## 2021-03-17 NOTE — ED Provider Notes (Signed)
[facility] EMERGENCY DEPARTMENT Provider Note   CSN: [id number] Arrival date & time: 03/17/21  2201     History No chief complaint on file.   Elliona Doddridge Kamath is a 60 y.o. female presenting for URI symptoms in the setting of a recent COVID exposure.  Patient states in the past 24 hours she has developed cold symptoms, which she describes as a nonproductive cough.  She denies shortness of breath, chest pain, nausea, abdominal pain.  She has been using over-the-counter decongestants.  She is vaccinated for COVID.  She has not done home testing.  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 06/26/2014   Body mass index (BMI) of 31.0-31.9 in adult 06/26/2014    Past Surgical History:  Procedure Laterality Date   BACK SURGERY     ECTOPIC PREGNANCY SURGERY  1991   KNEE ARTHROSCOPY WITH MEDIAL MENISECTOMY Right 11/26/2012   Procedure: KNEE ARTHROSCOPY WITH PARTIAL MEDIAL MENISECTOMY,DEBRIDEMENT;  Surgeon: Javier Docker, MD;  Location: [facility];  Service: Orthopedics;  Laterality: Right;   KNEE CLOSED REDUCTION Right 01/27/2017   Procedure: CLOSED MANIPULATION RIGHT KNEE;  Surgeon: Sheral Apley, MD;  Location: [facility];  Service: Orthopedics;  Laterality: Right;   LUMBAR FUSION  03-20-2010   L4 -- L5   TOTAL KNEE ARTHROPLASTY Right 11/11/2016   Procedure: TOTAL KNEE  ARTHROPLASTY;  Surgeon: Sheral Apley, MD;  Location: [facility] OR;  Service: Orthopedics;  Laterality: Right;     OB History   No obstetric history on file.     Family History  Problem Relation Age of Onset   Diabetes Mellitus II Mother    Hypertension Mother     Social History   Tobacco Use   Smoking status: Every Day    Packs/day: 0.25    Years: 34.00    Pack years: 8.50    Types: Cigarettes   Smokeless tobacco: Never   Tobacco comments:    smokes 4 cigs per day  Vaping Use   Vaping Use: Never used  Substance Use Topics   Alcohol use: Yes    Comment: socially   Drug use: No    Home Medications Prior to Admission medications   Medication Sig Start Date End Date Taking? Authorizing Provider  Cetirizine HCl 10 MG CAPS Take 1 capsule (10 mg total) by mouth daily for 10 days. 06/03/20 06/13/20  Wieters, Hallie C, PA-C  cyclobenzaprine (FLEXERIL) 10 MG tablet Take 0.5-1 tablets (5-10 mg total) by mouth 2 (two) times daily as needed for muscle spasms. 03/22/18   Harris, Abigail, PA-C  fluticasone (FLONASE) 50 MCG/ACT nasal spray Place 1-2 sprays into both nostrils daily. 06/03/20   Wieters, Hallie C, PA-C  gabapentin (NEURONTIN) 800 MG tablet Take 800 mg by mouth 3 (three) times daily.    [provider]  meloxicam (MOBIC) 15 MG tablet Take 1 tablet (15 mg total) by mouth daily. 03/22/18   Arthor Captain, PA-C  ondansetron (ZOFRAN) 4 MG tablet Take 1 tablet (4 mg total) by mouth every 8 (eight) hours as needed for nausea or vomiting. 01/27/17   Albina Billet III, PA-C  potassium chloride SA (K-DUR,KLOR-CON) 20 MEQ tablet Take 1 tablet (20 mEq total) by mouth 2 (two) times daily. 12/27/16 04/22/17  Michela Pitcher A, PA-C    Allergies    Bee venom, Aspirin, Lyrica [pregabalin], and Morphine and related  Review of Systems   Review of Systems  Constitutional:  Negative for fever.  HENT:  Negative for congestion.   Respiratory:  Positive for cough. Negative for  shortness of breath.   Cardiovascular:  Negative for chest pain.  Gastrointestinal:  Negative for nausea and vomiting.  Musculoskeletal:  Negative for myalgias.  Skin:  Negative for pallor.  Allergic/Immunologic: Positive for immunocompromised state.  Neurological:  Negative for headaches.  Hematological:  Does not bruise/bleed easily.  Psychiatric/Behavioral:  Negative for confusion.    Physical Exam Updated Vital Signs BP 114/79 (BP Location: Right Arm)   Pulse 75   Temp 98.6 F (37 C) (Oral)   Resp 20   LMP 04/26/2012   SpO2 99%   Physical Exam Vitals and nursing note reviewed.  Constitutional:      General: She is not in acute distress.    Appearance: Normal appearance.     Comments: Sitting in the chair in NAD  HENT:     Head: Normocephalic and atraumatic.     Right Ear: Tympanic membrane, ear canal and external ear normal.     Left Ear: Tympanic membrane, ear canal and external ear normal.     Mouth/Throat:     Pharynx: Uvula midline.  Eyes:     Extraocular Movements: Extraocular movements intact.     Conjunctiva/sclera: Conjunctivae normal.     Pupils: Pupils are equal, round, and reactive to light.  Cardiovascular:     Rate and Rhythm: Normal rate and regular rhythm.     Pulses: Normal pulses.  Pulmonary:     Effort: Pulmonary effort is normal.     Breath sounds: Normal breath sounds. No decreased breath sounds, wheezing, rhonchi or rales.     Comments: Speaking in full sentences.  Clear lung sounds in all fields.  Sats stable on room air. Abdominal:     General: There is no distension.     Palpations: Abdomen is soft. There is no mass.     Tenderness: There is no abdominal tenderness. There is no guarding or rebound.  Musculoskeletal:        General: Normal range of motion.     Cervical back: Normal range of motion.  Lymphadenopathy:     Cervical: No cervical adenopathy.  Skin:    General: Skin is warm.     Capillary Refill: Capillary refill takes less  than 2 seconds.  Neurological:     Mental Status: She is alert and oriented to person, place, and time.    ED Results / Procedures / Treatments   Labs (all labs ordered are listed, but only abnormal results are displayed) Labs Reviewed  SARS CORONAVIRUS 2 (TAT 6-24 HRS)    EKG None  Radiology No results found.  Procedures Procedures   Medications Ordered in ED Medications - No data to display  ED Course  I have reviewed the triage vital signs and the nursing notes.  Pertinent labs & imaging results that were available during my care of the patient were reviewed by me and considered in my medical decision making (see chart for details).    MDM Rules/Calculators/A&P                           Patient presenting for COVID testing.  URI symptoms in the last 24 hours, recent COVID exposure.  On exam, pulmonary exam is reassuring.  Sats stable.  Patient does not need hospitalization or further work-up for COVID.  Testing sent.  Discussed symptomatic management and strict return precautions.  At this time, patient appears safe for discharge.  Return precautions given.  Patient states she understands and agrees to plan.   Krislynn Gronau Kloehn was evaluated in Emergency Department on 03/17/2021 for the symptoms described in the history of present illness. She was evaluated in the context of the global COVID-19 pandemic, which necessitated consideration that the patient might be at risk for infection with the SARS-CoV-2 virus that causes COVID-19. Institutional protocols and algorithms that pertain to the evaluation of patients at risk for COVID-19 are in a state of rapid change based on information released by regulatory bodies including the CDC and federal and state organizations. These policies and algorithms were followed during the patient's care in the ED.   Final Clinical Impression(s) / ED Diagnoses Final diagnoses:  Suspected COVID-19 virus infection  Exposure to COVID-19 virus     Rx / DC Orders ED Discharge Orders     None        Alveria Apley, PA-C 03/17/21 2306    Pollyann Savoy, MD 03/17/21 [phone]

## 2021-03-18 LAB — SARS CORONAVIRUS 2 (TAT 6-24 HRS): SARS Coronavirus 2: POSITIVE — AB

## 2021-06-20 ENCOUNTER — Other Ambulatory Visit: Payer: Self-pay | Admitting: General Practice

## 2021-06-20 DIAGNOSIS — Z1231 Encounter for screening mammogram for malignant neoplasm of breast: Secondary | ICD-10-CM

## 2021-07-23 ENCOUNTER — Ambulatory Visit
Admission: RE | Admit: 2021-07-23 | Discharge: 2021-07-23 | Disposition: A | Discharge status: Home / Self Care | Payer: Medicare Other | Source: Ambulatory Visit | Attending: General Practice | Admitting: General Practice

## 2021-07-23 DIAGNOSIS — Z1231 Encounter for screening mammogram for malignant neoplasm of breast: Secondary | ICD-10-CM

## 2022-02-12 ENCOUNTER — Other Ambulatory Visit: Payer: Self-pay

## 2022-02-12 ENCOUNTER — Emergency Department (HOSPITAL_COMMUNITY): Payer: Medicare Other

## 2022-02-12 ENCOUNTER — Encounter (HOSPITAL_COMMUNITY): Payer: Self-pay | Admitting: *Deleted

## 2022-02-12 ENCOUNTER — Inpatient Hospital Stay (HOSPITAL_COMMUNITY)
Admission: EM | Admit: 2022-02-12 | Discharge: 2022-02-17 | DRG: 446 | Disposition: A | Discharge status: Home / Self Care | Payer: Medicare Other | Attending: Surgery | Admitting: Surgery

## 2022-02-12 DIAGNOSIS — Z20822 Contact with and (suspected) exposure to covid-19: Secondary | ICD-10-CM | POA: Diagnosis present

## 2022-02-12 DIAGNOSIS — K8000 Calculus of gallbladder with acute cholecystitis without obstruction: Secondary | ICD-10-CM | POA: Diagnosis not present

## 2022-02-12 DIAGNOSIS — K810 Acute cholecystitis: Secondary | ICD-10-CM | POA: Diagnosis present

## 2022-02-12 DIAGNOSIS — F32A Depression, unspecified: Secondary | ICD-10-CM | POA: Diagnosis present

## 2022-02-12 DIAGNOSIS — Z8249 Family history of ischemic heart disease and other diseases of the circulatory system: Secondary | ICD-10-CM

## 2022-02-12 DIAGNOSIS — F17210 Nicotine dependence, cigarettes, uncomplicated: Secondary | ICD-10-CM | POA: Diagnosis present

## 2022-02-12 DIAGNOSIS — K819 Cholecystitis, unspecified: Principal | ICD-10-CM

## 2022-02-12 DIAGNOSIS — E785 Hyperlipidemia, unspecified: Secondary | ICD-10-CM | POA: Diagnosis present

## 2022-02-12 DIAGNOSIS — Z96651 Presence of right artificial knee joint: Secondary | ICD-10-CM | POA: Diagnosis present

## 2022-02-12 DIAGNOSIS — G8929 Other chronic pain: Secondary | ICD-10-CM | POA: Diagnosis present

## 2022-02-12 DIAGNOSIS — Z833 Family history of diabetes mellitus: Secondary | ICD-10-CM

## 2022-02-12 LAB — CBC WITH DIFFERENTIAL/PLATELET
Abs Immature Granulocytes: 0.16 10*3/uL — ABNORMAL HIGH (ref 0.00–0.07)
Basophils Absolute: 0.1 10*3/uL (ref 0.0–0.1)
Basophils Relative: 0 %
Eosinophils Absolute: 0 10*3/uL (ref 0.0–0.5)
Eosinophils Relative: 0 %
HCT: 36.8 % (ref 36.0–46.0)
Hemoglobin: 12 g/dL (ref 12.0–15.0)
Immature Granulocytes: 1 %
Lymphocytes Relative: 7 %
Lymphs Abs: 1.8 10*3/uL (ref 0.7–4.0)
MCH: 31.6 pg (ref 26.0–34.0)
MCHC: 32.6 g/dL (ref 30.0–36.0)
MCV: 96.8 fL (ref 80.0–100.0)
Monocytes Absolute: 1.2 10*3/uL — ABNORMAL HIGH (ref 0.1–1.0)
Monocytes Relative: 5 %
Neutro Abs: 22.1 10*3/uL — ABNORMAL HIGH (ref 1.7–7.7)
Neutrophils Relative %: 87 %
Platelets: 265 10*3/uL (ref 150–400)
RBC: 3.8 MIL/uL — ABNORMAL LOW (ref 3.87–5.11)
RDW: 13.4 % (ref 11.5–15.5)
WBC: 25.4 10*3/uL — ABNORMAL HIGH (ref 4.0–10.5)
nRBC: 0 % (ref 0.0–0.2)

## 2022-02-12 LAB — COMPREHENSIVE METABOLIC PANEL
ALT: 20 U/L (ref 0–44)
AST: 19 U/L (ref 15–41)
Albumin: 3.6 g/dL (ref 3.5–5.0)
Alkaline Phosphatase: 88 U/L (ref 38–126)
Anion gap: 11 (ref 5–15)
BUN: 20 mg/dL (ref 6–20)
CO2: 22 mmol/L (ref 22–32)
Calcium: 8.3 mg/dL — ABNORMAL LOW (ref 8.9–10.3)
Chloride: 104 mmol/L (ref 98–111)
Creatinine, Ser: 1.14 mg/dL — ABNORMAL HIGH (ref 0.44–1.00)
GFR, Estimated: 55 mL/min — ABNORMAL LOW (ref >60)
Glucose, Bld: 112 mg/dL — ABNORMAL HIGH (ref 70–99)
Potassium: 3.1 mmol/L — ABNORMAL LOW (ref 3.5–5.1)
Sodium: 137 mmol/L (ref 135–145)
Total Bilirubin: 0.7 mg/dL (ref 0.3–1.2)
Total Protein: 7.3 g/dL (ref 6.5–8.1)

## 2022-02-12 LAB — RESP PANEL BY RT-PCR (FLU A&B, COVID) ARPGX2
Influenza A by PCR: NEGATIVE
Influenza B by PCR: NEGATIVE
SARS Coronavirus 2 by RT PCR: NEGATIVE

## 2022-02-12 LAB — LACTIC ACID, PLASMA: Lactic Acid, Venous: 1.3 mmol/L (ref 0.5–1.9)

## 2022-02-12 LAB — PROTIME-INR
INR: 1.2 (ref 0.8–1.2)
Prothrombin Time: 14.7 seconds (ref 11.4–15.2)

## 2022-02-12 LAB — LIPASE, BLOOD: Lipase: 19 U/L (ref 11–51)

## 2022-02-12 MED ORDER — FENTANYL CITRATE PF 50 MCG/ML IJ SOSY: 50.0000 ug | PREFILLED_SYRINGE | Freq: Once | INTRAMUSCULAR | Status: DC

## 2022-02-12 MED ORDER — METRONIDAZOLE 500 MG/100ML IV SOLN
500.0000 mg | Freq: Once | INTRAVENOUS | Status: AC
  Administered 2022-02-12: 500 mg via INTRAVENOUS
  Filled 2022-02-12: qty 100

## 2022-02-12 MED ORDER — ONDANSETRON HCL 4 MG/2ML IJ SOLN
4.0000 mg | Freq: Once | INTRAMUSCULAR | Status: AC
Start: 2022-02-12 — End: 2022-02-12
  Administered 2022-02-12: 4 mg via INTRAVENOUS
  Filled 2022-02-12: qty 2

## 2022-02-12 MED ORDER — ACETAMINOPHEN 325 MG PO TABS
650.0000 mg | ORAL_TABLET | Freq: Once | ORAL | Status: AC
  Administered 2022-02-12: 650 mg via ORAL
  Filled 2022-02-12: qty 2

## 2022-02-12 MED ORDER — POTASSIUM CHLORIDE CRYS ER 20 MEQ PO TBCR
40.0000 meq | EXTENDED_RELEASE_TABLET | Freq: Once | ORAL | Status: AC
  Administered 2022-02-12: 40 meq via ORAL
  Filled 2022-02-12: qty 2

## 2022-02-12 MED ORDER — LACTATED RINGERS IV SOLN: INTRAVENOUS | Status: DC

## 2022-02-12 MED ORDER — FENTANYL CITRATE PF 50 MCG/ML IJ SOSY
100.0000 ug | PREFILLED_SYRINGE | Freq: Once | INTRAMUSCULAR | Status: AC
  Administered 2022-02-12: 100 ug via INTRAVENOUS
  Filled 2022-02-12: qty 2

## 2022-02-12 MED ORDER — CEFTRIAXONE SODIUM 2 G IJ SOLR
2.0000 g | Freq: Once | INTRAMUSCULAR | Status: AC
  Administered 2022-02-12: 2 g via INTRAVENOUS
  Filled 2022-02-12: qty 20

## 2022-02-12 MED ORDER — LACTATED RINGERS IV BOLUS (SEPSIS)
1000.0000 mL | Freq: Once | INTRAVENOUS | Status: AC
  Administered 2022-02-12: 1000 mL via INTRAVENOUS

## 2022-02-12 NOTE — ED Provider Triage Note (Signed)
Emergency Medicine Provider Triage Evaluation Note  Martha Buck , a 61 y.o. female  was evaluated in triage.  Pt complains of flank pain. L flank pain and increase urinary frequency x 2 weeks.  Felt nauseous and feverish.  No diarrhea or constipation  Review of Systems  Positive: As above Negative: As above  Physical Exam  BP 108/71 (BP Location: Right Arm)   Pulse (!) 109   Temp 100.3 F (37.9 C) (Oral)   Resp 17   Ht 5' 8.5" (1.74 m)   Wt 97.1 kg   LMP 04/26/2012   SpO2 95%   BMI 32.08 kg/m  Gen:   Awake, no distress   Resp:  Normal effort  MSK:   Moves extremities without difficulty  Other:    Medical Decision Making  Medically screening exam initiated at 5:07 PM.  Appropriate orders placed.  Martha Buck was informed that the remainder of the evaluation will be completed by another provider, this initial triage assessment does not replace that evaluation, and the importance of remaining in the ED until their evaluation is complete.     Fayrene Helper, PA-C 02/12/22 1710

## 2022-02-12 NOTE — ED Triage Notes (Signed)
The pt reports that she has much back pain for sevrral days   and she reports that she had a spasm in her lt arm  yesterday and she could not talk the episode lasted for 20-25 minutes  she did not see anyone

## 2022-02-12 NOTE — ED Provider Notes (Signed)
[facility] EMERGENCY DEPARTMENT Provider Note   CSN: [id number] Arrival date & time: 02/12/22  1541     History {Add pertinent medical, surgical, social history, OB history to HPI:1} Chief Complaint  Patient presents with   Back Pain    Martha Buck is a 61 y.o. female.   Back Pain Associated symptoms: abdominal pain, fever and numbness (Transient)   Patient presents for multiple complaints.  Medical history includes arthritis, nephrolithiasis, depression.  2 days ago, she experienced a numbness sensation throughout her body.  This only lasted for a few seconds.  At that point, she developed epigastric pain and nausea.  She has had continued epigastric pain and nausea for the past 2 days.  Because of this, she has not eaten or drank.  She has had subjective fevers and generalized body aches.  She denies any dysuria but does states that she has had urinary frequency for the past 2 weeks.      Home Medications Prior to Admission medications   Medication Sig Start Date End Date Taking? Authorizing Provider  Cetirizine HCl 10 MG CAPS Take 1 capsule (10 mg total) by mouth daily for 10 days. 06/03/20 06/13/20  Wieters, Hallie C, PA-C  cyclobenzaprine (FLEXERIL) 10 MG tablet Take 0.5-1 tablets (5-10 mg total) by mouth 2 (two) times daily as needed for muscle spasms. 03/22/18   Harris, Abigail, PA-C  fluticasone (FLONASE) 50 MCG/ACT nasal spray Place 1-2 sprays into both nostrils daily. 06/03/20   Wieters, Hallie C, PA-C  gabapentin (NEURONTIN) 800 MG tablet Take 800 mg by mouth 3 (three) times daily.    [provider]  meloxicam (MOBIC) 15 MG tablet Take 1 tablet (15 mg total) by mouth daily. 03/22/18   Arthor Captain, PA-C  ondansetron (ZOFRAN) 4 MG tablet Take 1 tablet (4 mg total) by mouth every 8 (eight) hours as needed for nausea or vomiting. 01/27/17   Albina Billet III, PA-C  potassium chloride SA (K-DUR,KLOR-CON) 20 MEQ tablet Take 1 tablet  (20 mEq total) by mouth 2 (two) times daily. 12/27/16 04/22/17  Michela Pitcher A, PA-C      Allergies    Bee venom, Aspirin, Lyrica [pregabalin], and Morphine and related    Review of Systems   Review of Systems  Constitutional:  Positive for appetite change, chills, fatigue and fever.  Gastrointestinal:  Positive for abdominal pain and nausea.  Genitourinary:  Positive for frequency.  Musculoskeletal:  Positive for arthralgias and myalgias.  Neurological:  Positive for numbness (Transient).  All other systems reviewed and are negative.   Physical Exam Updated Vital Signs BP 99/68   Pulse 98   Temp 100.3 F (37.9 C) (Oral)   Resp 16   Ht 5' 8.5" (1.74 m)   Wt 97.1 kg   LMP 04/26/2012   SpO2 94%   BMI 32.08 kg/m  Physical Exam Vitals and nursing note reviewed.  Constitutional:      General: She is not in acute distress.    Appearance: Normal appearance. She is well-developed. She is not toxic-appearing or diaphoretic.  HENT:     Head: Normocephalic and atraumatic.     Right Ear: External ear normal.     Left Ear: External ear normal.     Nose: Nose normal.     Mouth/Throat:     Mouth: Mucous membranes are moist.     Pharynx: Oropharynx is clear.  Eyes:     Extraocular Movements: Extraocular movements intact.  Conjunctiva/sclera: Conjunctivae normal.  Cardiovascular:     Rate and Rhythm: Normal rate and regular rhythm.     Heart sounds: No murmur heard. Pulmonary:     Effort: Pulmonary effort is normal. No respiratory distress.     Breath sounds: Normal breath sounds. No wheezing or rales.  Chest:     Chest wall: No tenderness.  Abdominal:     Palpations: Abdomen is soft.     Tenderness: There is abdominal tenderness (Epigastric, greater on the right). There is no guarding or rebound.  Musculoskeletal:        General: No swelling. Normal range of motion.     Cervical back: Normal range of motion and neck supple.     Right lower leg: No edema.     Left lower leg:  No edema.  Skin:    General: Skin is warm and dry.     Coloration: Skin is not jaundiced or pale.  Neurological:     General: No focal deficit present.     Mental Status: She is alert and oriented to person, place, and time.     Cranial Nerves: No cranial nerve deficit.     Sensory: No sensory deficit.     Motor: No weakness.     Coordination: Coordination normal.  Psychiatric:        Mood and Affect: Mood normal.        Behavior: Behavior normal.     ED Results / Procedures / Treatments   Labs (all labs ordered are listed, but only abnormal results are displayed) Labs Reviewed  CBC WITH DIFFERENTIAL/PLATELET - Abnormal; Notable for the following components:      Result Value   WBC 25.4 (*)    RBC 3.80 (*)    Neutro Abs 22.1 (*)    Monocytes Absolute 1.2 (*)    Abs Immature Granulocytes 0.16 (*)    All other components within normal limits  COMPREHENSIVE METABOLIC PANEL - Abnormal; Notable for the following components:   Potassium 3.1 (*)    Glucose, Bld 112 (*)    Creatinine, Ser 1.14 (*)    Calcium 8.3 (*)    GFR, Estimated 55 (*)    All other components within normal limits  LIPASE, BLOOD  URINALYSIS, ROUTINE W REFLEX MICROSCOPIC    EKG None  Radiology CT Renal Stone Study  Result Date: 02/12/2022 CLINICAL DATA:  LEFT flank and back pain question kidney stone, increased urinary frequency for 2 weeks, nauseous, fever H, history of kidney stones EXAM: CT ABDOMEN AND PELVIS WITHOUT CONTRAST TECHNIQUE: Multidetector CT imaging of the abdomen and pelvis was performed following the standard protocol without IV contrast. RADIATION DOSE REDUCTION: This exam was performed according to the departmental dose-optimization program which includes automated exposure control, adjustment of the mA and/or kV according to patient size and/or use of iterative reconstruction technique. COMPARISON:  04/07/2010 FINDINGS: Lower chest: RIGHT basilar atelectasis Hepatobiliary: Abnormal  enlarged gallbladder containing calculus, wall thickening, and pericholecystic infiltration highly suspicious for acute cholecystitis. Cannot exclude pericholecystic fluid collection. Liver unremarkable. No definite biliary dilatation. Pancreas: Normal appearance Spleen: Normal appearance Adrenals/Urinary Tract: Adrenal glands, kidneys, and ureters normal appearance. Bladder decompressed. Stomach/Bowel: Stomach mildly distended by gas. Large and small bowel loops unremarkable. Normal appendix. Vascular/Lymphatic: Atherosclerotic calcifications aorta without aneurysm. No adenopathy. Reproductive: Retroverted uterus.  No adnexal masses. Other: No free air or free fluid.  No hernia. Musculoskeletal: Prior lumbar fusion L4-L5. Degenerative disc disease changes L5-S1. IMPRESSION: Abnormally enlarged gallbladder containing calculus, wall  thickening, and pericholecystic infiltration highly suspicious for acute cholecystitis. Cannot exclude a discrete pericholecystic fluid collection; recommend correlation with ultrasound. Aortic Atherosclerosis (ICD10-I70.0). Electronically Signed   By: Ulyses Southward M.D.   On: 02/12/2022 17:46    Procedures Procedures  {Document cardiac monitor, telemetry assessment procedure when appropriate:1}  Medications Ordered in ED Medications - No data to display  ED Course/ Medical Decision Making/ A&P                           Medical Decision Making Amount and/or Complexity of Data Reviewed Labs: ordered. Radiology: ordered. ECG/medicine tests: ordered.  Risk OTC drugs. Prescription drug management.   This patient presents to the ED for concern of epigastric pain and nausea, this involves an extensive number of treatment options, and is a complaint that carries with it a high risk of complications and morbidity.  The differential diagnosis includes PUD, pancreatitis, cholecystitis, hepatitis, nephrolithiasis, pyelonephritis   Co morbidities that complicate the patient  evaluation  arthritis, nephrolithiasis, depression   Additional history obtained:  Additional history obtained from N/A External records from outside source obtained and reviewed including EMR   Lab Tests:  I Ordered, and personally interpreted labs.  The pertinent results include: Leukocytosis of 25.4.  Patient has normal hemoglobin and no lactic acidosis.  Lipase and hepatobiliary enzymes are normal.  Urinalysis shows***.   Imaging Studies ordered:  I ordered imaging studies including CT of abdomen pelvis, chest x-ray, right upper quadrant ultrasound I independently visualized and interpreted imaging which showed CT scan showed concern of cholecystitis.  Abdominal ultrasound showed***. I agree with the radiologist interpretation   Cardiac Monitoring: / EKG:  The patient was maintained on a cardiac monitor.  I personally viewed and interpreted the cardiac monitored which showed an underlying rhythm of: Sinus rhythm   Consultations Obtained:  I requested consultation with the ***,  and discussed lab and imaging findings as well as pertinent plan - they recommend: ***   Problem List / ED Course / Critical interventions / Medication management  Patient is a 61 year old female presenting for multiple complaints.  She has had fever nausea over the past several days.  Vital signs in triage notable for low-grade fever.  Due to her nausea, she has not eaten or drank in the last day and a half.  On initial assessment, patient complaining of epigastric pain.  Tenderness is present in the epigastrium, greater on the right.  Prior being bedded in the ED, laboratory work-up and CT imaging were obtained.  CT imaging is concerning for cholecystitis.  Patient hepatobiliary enzymes are normal.  She does have a leukocytosis of 25.4.  Due to her decreased p.o. intake and concern of possible sepsis, 30 cc/kg of IV fluids were ordered.  Patient was treated empirically for intra-abdominal infection  and/or a urine infection with ceftriaxone and Flagyl.  She was given Tylenol and fentanyl for analgesia.  Lab work also showed hypokalemia of 3.1.  Replacement potassium was ordered.  Ultrasound of right upper quadrant was ordered to further characterize gallbladder findings on CT scan.  Results of ultrasound showed***. I ordered medication including IV fluids for dehydration; ceftriaxone and Flagyl for empiric treatment of intra-abdominal/or urinary infection; Tylenol and fentanyl for analgesia; Zofran for nausea; potassium chloride for hypokalemia;***. Reevaluation of the patient after these medicines showed that the patient improved I have reviewed the patients home medicines and have made adjustments as needed   Social Determinants of Health:  Does not have PCP   Test / Admission - Considered:  ***   {Document critical care time when appropriate:1} {Document review of labs and clinical decision tools ie heart score, Chads2Vasc2 etc:1}  {Document your independent review of radiology images, and any outside records:1} {Document your discussion with family members, caretakers, and with consultants:1} {Document social determinants of health affecting pt's care:1} {Document your decision making why or why not admission, treatments were needed:1} Final Clinical Impression(s) / ED Diagnoses Final diagnoses:  None    Rx / DC Orders ED Discharge Orders     None

## 2022-02-13 ENCOUNTER — Encounter (HOSPITAL_COMMUNITY): Payer: Self-pay

## 2022-02-13 ENCOUNTER — Inpatient Hospital Stay (HOSPITAL_COMMUNITY): Payer: Medicare Other

## 2022-02-13 DIAGNOSIS — Z20822 Contact with and (suspected) exposure to covid-19: Secondary | ICD-10-CM | POA: Diagnosis present

## 2022-02-13 DIAGNOSIS — F17210 Nicotine dependence, cigarettes, uncomplicated: Secondary | ICD-10-CM | POA: Diagnosis present

## 2022-02-13 DIAGNOSIS — Z833 Family history of diabetes mellitus: Secondary | ICD-10-CM | POA: Diagnosis not present

## 2022-02-13 DIAGNOSIS — Z8249 Family history of ischemic heart disease and other diseases of the circulatory system: Secondary | ICD-10-CM | POA: Diagnosis not present

## 2022-02-13 DIAGNOSIS — F32A Depression, unspecified: Secondary | ICD-10-CM | POA: Diagnosis present

## 2022-02-13 DIAGNOSIS — E785 Hyperlipidemia, unspecified: Secondary | ICD-10-CM | POA: Diagnosis present

## 2022-02-13 DIAGNOSIS — K810 Acute cholecystitis: Secondary | ICD-10-CM | POA: Diagnosis present

## 2022-02-13 DIAGNOSIS — K8000 Calculus of gallbladder with acute cholecystitis without obstruction: Secondary | ICD-10-CM | POA: Diagnosis present

## 2022-02-13 DIAGNOSIS — Z96651 Presence of right artificial knee joint: Secondary | ICD-10-CM | POA: Diagnosis present

## 2022-02-13 DIAGNOSIS — G8929 Other chronic pain: Secondary | ICD-10-CM | POA: Diagnosis present

## 2022-02-13 HISTORY — PX: IR PERC CHOLECYSTOSTOMY: IMG2326

## 2022-02-13 LAB — URINALYSIS, ROUTINE W REFLEX MICROSCOPIC
Bilirubin Urine: NEGATIVE
Glucose, UA: NEGATIVE mg/dL
Ketones, ur: 5 mg/dL — AB
Nitrite: NEGATIVE
Protein, ur: 100 mg/dL — AB
Specific Gravity, Urine: 1.02 (ref 1.005–1.030)
pH: 5 (ref 5.0–8.0)

## 2022-02-13 LAB — COMPREHENSIVE METABOLIC PANEL
ALT: 16 U/L (ref 0–44)
AST: 15 U/L (ref 15–41)
Albumin: 2.7 g/dL — ABNORMAL LOW (ref 3.5–5.0)
Alkaline Phosphatase: 63 U/L (ref 38–126)
Anion gap: 11 (ref 5–15)
BUN: 14 mg/dL (ref 6–20)
CO2: 25 mmol/L (ref 22–32)
Calcium: 8.1 mg/dL — ABNORMAL LOW (ref 8.9–10.3)
Chloride: 105 mmol/L (ref 98–111)
Creatinine, Ser: 0.87 mg/dL (ref 0.44–1.00)
GFR, Estimated: >60 mL/min (ref >60)
Glucose, Bld: 100 mg/dL — ABNORMAL HIGH (ref 70–99)
Potassium: 3 mmol/L — ABNORMAL LOW (ref 3.5–5.1)
Sodium: 141 mmol/L (ref 135–145)
Total Bilirubin: 0.7 mg/dL (ref 0.3–1.2)
Total Protein: 5.8 g/dL — ABNORMAL LOW (ref 6.5–8.1)

## 2022-02-13 LAB — HIV ANTIBODY (ROUTINE TESTING W REFLEX): HIV Screen 4th Generation wRfx: NONREACTIVE

## 2022-02-13 MED ORDER — FENTANYL CITRATE (PF) 100 MCG/2ML IJ SOLN
INTRAMUSCULAR | Status: AC
  Filled 2022-02-13: qty 2

## 2022-02-13 MED ORDER — LACTATED RINGERS IV SOLN: INTRAVENOUS | Status: DC

## 2022-02-13 MED ORDER — MIDAZOLAM HCL 2 MG/2ML IJ SOLN
INTRAMUSCULAR | Status: AC | PRN
  Administered 2022-02-13: 1 mg via INTRAVENOUS

## 2022-02-13 MED ORDER — DIPHENHYDRAMINE HCL 50 MG/ML IJ SOLN: 12.5000 mg | Freq: Four times a day (QID) | INTRAMUSCULAR | Status: DC | PRN

## 2022-02-13 MED ORDER — LIDOCAINE HCL 1 % IJ SOLN
INTRAMUSCULAR | Status: AC
  Administered 2022-02-13: 10 mL
  Filled 2022-02-13: qty 20

## 2022-02-13 MED ORDER — ONDANSETRON HCL 4 MG/2ML IJ SOLN: 4.0000 mg | Freq: Four times a day (QID) | INTRAMUSCULAR | Status: DC | PRN

## 2022-02-13 MED ORDER — IBUPROFEN 600 MG PO TABS: 600.0000 mg | ORAL_TABLET | Freq: Four times a day (QID) | ORAL | Status: DC | PRN

## 2022-02-13 MED ORDER — DIPHENHYDRAMINE HCL 12.5 MG/5ML PO ELIX: 12.5000 mg | ORAL_SOLUTION | Freq: Four times a day (QID) | ORAL | Status: DC | PRN

## 2022-02-13 MED ORDER — TRAMADOL HCL 50 MG PO TABS
50.0000 mg | ORAL_TABLET | Freq: Four times a day (QID) | ORAL | Status: DC | PRN
  Administered 2022-02-13 – 2022-02-16 (×4): 50 mg via ORAL
  Filled 2022-02-13 (×4): qty 1

## 2022-02-13 MED ORDER — SIMETHICONE 80 MG PO CHEW: 40.0000 mg | CHEWABLE_TABLET | Freq: Four times a day (QID) | ORAL | Status: DC | PRN

## 2022-02-13 MED ORDER — ENOXAPARIN SODIUM 40 MG/0.4ML IJ SOSY: 40.0000 mg | PREFILLED_SYRINGE | Freq: Every day | INTRAMUSCULAR | Status: DC

## 2022-02-13 MED ORDER — HYDROMORPHONE HCL 1 MG/ML IJ SOLN
0.5000 mg | INTRAMUSCULAR | Status: DC | PRN
  Administered 2022-02-13 – 2022-02-14 (×2): 0.5 mg via INTRAVENOUS
  Filled 2022-02-13 (×2): qty 0.5

## 2022-02-13 MED ORDER — CYCLOBENZAPRINE HCL 10 MG PO TABS
10.0000 mg | ORAL_TABLET | Freq: Two times a day (BID) | ORAL | Status: DC | PRN
  Administered 2022-02-13: 10 mg via ORAL
  Filled 2022-02-13: qty 1

## 2022-02-13 MED ORDER — ONDANSETRON 4 MG PO TBDP: 4.0000 mg | ORAL_TABLET | Freq: Four times a day (QID) | ORAL | Status: DC | PRN

## 2022-02-13 MED ORDER — GABAPENTIN 400 MG PO CAPS
800.0000 mg | ORAL_CAPSULE | Freq: Three times a day (TID) | ORAL | Status: DC
Start: 2022-02-13 — End: 2022-02-17
  Administered 2022-02-13 – 2022-02-17 (×13): 800 mg via ORAL
  Filled 2022-02-13 (×13): qty 2

## 2022-02-13 MED ORDER — FENTANYL CITRATE (PF) 100 MCG/2ML IJ SOLN
INTRAMUSCULAR | Status: AC | PRN
  Administered 2022-02-13: 50 ug via INTRAVENOUS

## 2022-02-13 MED ORDER — HYDRALAZINE HCL 20 MG/ML IJ SOLN: 10.0000 mg | INTRAMUSCULAR | Status: DC | PRN

## 2022-02-13 MED ORDER — IOHEXOL 300 MG/ML  SOLN
100.0000 mL | Freq: Once | INTRAMUSCULAR | Status: AC | PRN
Start: 2022-02-13 — End: 2022-02-13
  Administered 2022-02-13: 10 mL

## 2022-02-13 MED ORDER — ACETAMINOPHEN 500 MG PO TABS
1000.0000 mg | ORAL_TABLET | Freq: Four times a day (QID) | ORAL | Status: DC
Start: 2022-02-13 — End: 2022-02-17
  Administered 2022-02-13 – 2022-02-17 (×14): 1000 mg via ORAL
  Filled 2022-02-13 (×15): qty 2

## 2022-02-13 MED ORDER — PIPERACILLIN-TAZOBACTAM 3.375 G IVPB
3.3750 g | Freq: Three times a day (TID) | INTRAVENOUS | Status: DC
  Administered 2022-02-13 – 2022-02-17 (×14): 3.375 g via INTRAVENOUS
  Filled 2022-02-13 (×14): qty 50

## 2022-02-13 MED ORDER — MIDAZOLAM HCL 2 MG/2ML IJ SOLN
INTRAMUSCULAR | Status: AC
  Filled 2022-02-13: qty 2

## 2022-02-13 NOTE — H&P (Signed)
CC: RUQ Pain x 3 days  HPI: Martha Buck is an 61 y.o. female with hx of arthritis, seasonal allergies - presented to [facility] ED with 3d hx of progressive RUQ pain and some nausea. Reports she has had a sorts of abdominal pains over the months, some in RUQ, but over the last 3 days things getting much more noticible and worse. No radiation. 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   History of kidney stones    hx of   History of pneumonia    Joint pain     Past Surgical History:  Procedure Laterality Date   BACK SURGERY     ECTOPIC PREGNANCY SURGERY  1991   KNEE ARTHROSCOPY WITH MEDIAL MENISECTOMY Right 11/26/2012   Procedure: KNEE ARTHROSCOPY WITH PARTIAL MEDIAL MENISECTOMY,DEBRIDEMENT;  Surgeon: Javier Docker, MD;  Location: [facility];  Service: Orthopedics;  Laterality: Right;   KNEE CLOSED REDUCTION Right 01/27/2017   Procedure: CLOSED MANIPULATION RIGHT KNEE;  Surgeon: Sheral Apley, MD;  Location: [facility];  Service: Orthopedics;  Laterality: Right;   LUMBAR FUSION  03-20-2010   L4 -- L5   TOTAL KNEE ARTHROPLASTY Right 11/11/2016   Procedure: TOTAL KNEE ARTHROPLASTY;  Surgeon: Sheral Apley, MD;  Location: [facility] OR;  Service: Orthopedics;  Laterality: Right;    Family History  Problem Relation Age of Onset   Diabetes Mellitus II Mother    Hypertension Mother     Social:  reports that she has been smoking cigarettes. She has a 8.50 pack-year smoking history. She has never used smokeless tobacco. She reports current alcohol use. She reports that she does not use drugs.  Allergies:  Allergies  Allergen Reactions   Bee Venom Anaphylaxis   Aspirin Itching   Lyrica [Pregabalin] Rash    Pt. States that she breaks out in hives when she takes Lyrica.    Morphine And Related Itching    Medications: I have reviewed the patient's current  medications.  Results for orders placed or performed during the hospital encounter of 02/12/22 (from the past 48 hour(s))  CBC with Differential     Status: Abnormal   Collection Time: 02/12/22  5:40 PM  Result Value Ref Range   WBC 25.4 (H) 4.0 - 10.5 K/uL   RBC 3.80 (L) 3.87 - 5.11 MIL/uL   Hemoglobin 12.0 12.0 - 15.0 g/dL   HCT 58.5 27.7 - 82.4 %   MCV 96.8 80.0 - 100.0 fL   MCH 31.6 26.0 - 34.0 pg   MCHC 32.6 30.0 - 36.0 g/dL   RDW 23.5 36.1 - 44.3 %   Platelets 265 150 - 400 K/uL   nRBC 0.0 0.0 - 0.2 %   Neutrophils Relative % 87 %   Neutro Abs 22.1 (H) 1.7 - 7.7 K/uL   Lymphocytes Relative 7 %   Lymphs Abs 1.8 0.7 - 4.0 K/uL   Monocytes Relative 5 %   Monocytes Absolute 1.2 (H) 0.1 - 1.0 K/uL   Eosinophils Relative 0 %   Eosinophils Absolute 0.0 0.0 - 0.5 K/uL   Basophils Relative 0 %   Basophils Absolute 0.1 0.0 - 0.1 K/uL   Immature Granulocytes 1 %   Abs Immature Granulocytes 0.16 (H) 0.00 - 0.07 K/uL    Comment: Performed at [facility], [address] [place], [place] [address]  Comprehensive  metabolic panel     Status: Abnormal   Collection Time: 02/12/22  5:40 PM  Result Value Ref Range   Sodium 137 135 - 145 mmol/L   Potassium 3.1 (L) 3.5 - 5.1 mmol/L   Chloride 104 98 - 111 mmol/L   CO2 22 22 - 32 mmol/L   Glucose, Bld 112 (H) 70 - 99 mg/dL    Comment: Glucose reference range applies only to samples taken after fasting for at least 8 hours.   BUN 20 6 - 20 mg/dL   Creatinine, Ser 4.09 (H) 0.44 - 1.00 mg/dL   Calcium 8.3 (L) 8.9 - 10.3 mg/dL   Total Protein 7.3 6.5 - 8.1 g/dL   Albumin 3.6 3.5 - 5.0 g/dL   AST 19 15 - 41 U/L   ALT 20 0 - 44 U/L   Alkaline Phosphatase 88 38 - 126 U/L   Total Bilirubin 0.7 0.3 - 1.2 mg/dL   GFR, Estimated 55 (L) >60 mL/min    Comment: (NOTE) Calculated using the CKD-EPI Creatinine Equation (2021)    Anion gap 11 5 - 15    Comment: Performed at [facility], [address] [place], [place] [address]   Lipase, blood     Status: None   Collection Time: 02/12/22  5:40 PM  Result Value Ref Range   Lipase 19 11 - 51 U/L    Comment: Performed at [facility], [address] [place], [place] [address]  Resp Panel by RT-PCR (Flu A&B, Covid) Anterior Nasal Swab     Status: None   Collection Time: 02/12/22  9:11 PM   Specimen: Anterior Nasal Swab  Result Value Ref Range   SARS Coronavirus 2 by RT PCR NEGATIVE NEGATIVE    Comment: (NOTE) SARS-CoV-2 target nucleic acids are NOT DETECTED.  The SARS-CoV-2 RNA is generally detectable in upper respiratory specimens during the acute phase of infection. The lowest concentration of SARS-CoV-2 viral copies this assay can detect is 138 copies/mL. A negative result does not preclude SARS-Cov-2 infection and should not be used as the sole basis for treatment or other patient management decisions. A negative result may occur with  improper specimen collection/handling, submission of specimen other than nasopharyngeal swab, presence of viral mutation(s) within the areas targeted by this assay, and inadequate number of viral copies(<138 copies/mL). A negative result must be combined with clinical observations, patient history, and epidemiological information. The expected result is Negative.  Fact Sheet for Patients:  BloggerCourse.com  Fact Sheet for Healthcare Providers:  SeriousBroker.it  This test is no t yet approved or cleared by the [place] FDA and  has been authorized for detection and/or diagnosis of SARS-CoV-2 by FDA under an Emergency Use Authorization (EUA). This EUA will remain  in effect (meaning this test can be used) for the duration of the COVID-19 declaration under Section 564(b)(1) of the Act, 21 U.S.C.section 360bbb-3(b)(1), unless the authorization is terminated  or revoked sooner.       Influenza A by PCR NEGATIVE NEGATIVE   Influenza B by PCR NEGATIVE NEGATIVE     Comment: (NOTE) The Xpert Xpress SARS-CoV-2/FLU/RSV plus assay is intended as an aid in the diagnosis of influenza from Nasopharyngeal swab specimens and should not be used as a sole basis for treatment. Nasal washings and aspirates are unacceptable for Xpert Xpress SARS-CoV-2/FLU/RSV testing.  Fact Sheet for Patients: BloggerCourse.com  Fact Sheet for Healthcare Providers: SeriousBroker.it  This test is not yet approved or cleared by  the [employer] and has been authorized for detection and/or diagnosis of SARS-CoV-2 by FDA under an Emergency Use Authorization (EUA). This EUA will remain in effect (meaning this test can be used) for the duration of the COVID-19 declaration under Section 564(b)(1) of the Act, 21 U.S.C. section 360bbb-3(b)(1), unless the authorization is terminated or revoked.  Performed at [facility], [address] [place], [place] [address]   Lactic acid, plasma     Status: None   Collection Time: 02/12/22  9:46 PM  Result Value Ref Range   Lactic Acid, Venous 1.3 0.5 - 1.9 mmol/L    Comment: Performed at [facility], [address] [place], [place] [address]  Protime-INR     Status: None   Collection Time: 02/12/22  9:46 PM  Result Value Ref Range   Prothrombin Time 14.7 11.4 - 15.2 seconds   INR 1.2 0.8 - 1.2    Comment: (NOTE) INR goal varies based on device and disease states. Performed at [facility], [address] [place], [place] [address]   Urinalysis, Routine w reflex microscopic     Status: Abnormal   Collection Time: 02/13/22 12:12 AM  Result Value Ref Range   Color, Urine AMBER (A) YELLOW    Comment: BIOCHEMICALS MAY BE AFFECTED BY COLOR   APPearance HAZY (A) CLEAR   Specific Gravity, Urine 1.020 1.005 - 1.030   pH 5.0 5.0 - 8.0   Glucose, UA NEGATIVE NEGATIVE mg/dL   Hgb urine dipstick SMALL (A) NEGATIVE   Bilirubin Urine NEGATIVE NEGATIVE   Ketones,  ur 5 (A) NEGATIVE mg/dL   Protein, ur 213 (A) NEGATIVE mg/dL   Nitrite NEGATIVE NEGATIVE   Leukocytes,Ua TRACE (A) NEGATIVE   RBC / HPF 11-20 0 - 5 RBC/hpf   WBC, UA 6-10 0 - 5 WBC/hpf   Bacteria, UA RARE (A) NONE SEEN   Squamous Epithelial / LPF 0-5 0 - 5   Mucus PRESENT    Hyaline Casts, UA PRESENT     Comment: Performed at [facility], [address], [place], [place] [address]    US Abdomen Limited  Result Date: 02/12/2022 CLINICAL DATA:  Right upper quadrant pain EXAM: ULTRASOUND ABDOMEN LIMITED RIGHT UPPER QUADRANT COMPARISON:  CT 02/12/2022 FINDINGS: Gallbladder: Multiple large gallstones. Gallbladder sludge with gallbladder wall septation and pericholecystic fluid. Murphy's sign is negative. Common bile duct: Diameter: 5 mm, normal Liver: No focal lesion identified. Within normal limits in parenchymal echogenicity. Portal vein is patent on color Doppler imaging with normal direction of blood flow towards the liver. Other: None. IMPRESSION: Abnormal gallbladder with cholelithiasis, sludge, and pericholecystic edema. Murphy's sign is negative. Electronically Signed   By: Burman Nieves M.D.   On: 02/12/2022 23:10   DG Chest Port 1 View  Result Date: 02/12/2022 CLINICAL DATA:  Question of sepsis. EXAM: PORTABLE CHEST 1 VIEW COMPARISON:  12/27/2016 FINDINGS: Shallow inspiration with atelectasis or fibrosis in the lung bases. Heart size is normal for technique. No pleural effusions. No pneumothorax. No focal consolidation. Gas-filled colon in the upper abdomen suggests ileus. IMPRESSION: Shallow inspiration with linear atelectasis or fibrosis in the lung bases. Electronically Signed   By: Burman Nieves M.D.   On: 02/12/2022 21:28   CT Renal Stone Study  Result Date: 02/12/2022 CLINICAL DATA:  LEFT flank and back pain question kidney stone, increased urinary frequency for 2 weeks, nauseous, fever H, history of kidney stones EXAM: CT ABDOMEN AND PELVIS WITHOUT  CONTRAST TECHNIQUE:  Multidetector CT imaging of the abdomen and pelvis was performed following the standard protocol without IV contrast. RADIATION DOSE REDUCTION: This exam was performed according to the departmental dose-optimization program which includes automated exposure control, adjustment of the mA and/or kV according to patient size and/or use of iterative reconstruction technique. COMPARISON:  04/07/2010 FINDINGS: Lower chest: RIGHT basilar atelectasis Hepatobiliary: Abnormal enlarged gallbladder containing calculus, wall thickening, and pericholecystic infiltration highly suspicious for acute cholecystitis. Cannot exclude pericholecystic fluid collection. Liver unremarkable. No definite biliary dilatation. Pancreas: Normal appearance Spleen: Normal appearance Adrenals/Urinary Tract: Adrenal glands, kidneys, and ureters normal appearance. Bladder decompressed. Stomach/Bowel: Stomach mildly distended by gas. Large and small bowel loops unremarkable. Normal appendix. Vascular/Lymphatic: Atherosclerotic calcifications aorta without aneurysm. No adenopathy. Reproductive: Retroverted uterus.  No adnexal masses. Other: No free air or free fluid.  No hernia. Musculoskeletal: Prior lumbar fusion L4-L5. Degenerative disc disease changes L5-S1. IMPRESSION: Abnormally enlarged gallbladder containing calculus, wall thickening, and pericholecystic infiltration highly suspicious for acute cholecystitis. Cannot exclude a discrete pericholecystic fluid collection; recommend correlation with ultrasound. Aortic Atherosclerosis (ICD10-I70.0). Electronically Signed   By: Ulyses Southward M.D.   On: 02/12/2022 17:46    ROS - all of the below systems have been reviewed with the patient and positives are indicated with bold text General: chills, fever or night sweats Eyes: blurry vision or double vision ENT: epistaxis or sore throat Allergy/Immunology: itchy/watery eyes or nasal congestion Hematologic/Lymphatic: bleeding problems, blood clots or  swollen lymph nodes Endocrine: temperature intolerance or unexpected weight changes Breast: new or changing breast lumps or nipple discharge Resp: cough, shortness of breath, or wheezing CV: chest pain or dyspnea on exertion GI: as per HPI GU: dysuria, trouble voiding, or hematuria MSK: joint pain or joint stiffness Neuro: TIA or stroke symptoms Derm: pruritus and skin lesion changes Psych: anxiety and depression  PE Blood pressure 96/70, pulse 72, temperature 100.3 F (37.9 C), temperature source Oral, resp. rate (!) 21, height 5' 8.5" (1.74 m), weight 97.1 kg, last menstrual period 04/26/2012, SpO2 93 %.  Constitutional: NAD; conversant; no deformities Eyes: Moist conjunctiva; no lid lag; anicteric; PERRL Neck: Trachea midline; no thyromegaly Lungs: Normal respiratory effort; no tactile fremitus CV: RRR; no palpable thrills; no pitting edema GI: Abd soft, moderately ttp RUQ, no rebound nor guarding; no palpable hepatosplenomegaly MSK: Normal range of motion of extremities; no clubbing/cyanosis Psychiatric: Appropriate affect; alert and oriented x3 Lymphatic: No palpable cervical or axillary lymphadenopathy  Results for orders placed or performed during the hospital encounter of 02/12/22 (from the past 48 hour(s))  CBC with Differential     Status: Abnormal   Collection Time: 02/12/22  5:40 PM  Result Value Ref Range   WBC 25.4 (H) 4.0 - 10.5 K/uL   RBC 3.80 (L) 3.87 - 5.11 MIL/uL   Hemoglobin 12.0 12.0 - 15.0 g/dL   HCT 86.7 54.4 - 92.0 %   MCV 96.8 80.0 - 100.0 fL   MCH 31.6 26.0 - 34.0 pg   MCHC 32.6 30.0 - 36.0 g/dL   RDW 10.0 71.2 - 19.7 %   Platelets 265 150 - 400 K/uL   nRBC 0.0 0.0 - 0.2 %   Neutrophils Relative % 87 %   Neutro Abs 22.1 (H) 1.7 - 7.7 K/uL   Lymphocytes Relative 7 %   Lymphs Abs 1.8 0.7 - 4.0 K/uL   Monocytes Relative 5 %   Monocytes Absolute 1.2 (H) 0.1 - 1.0 K/uL   Eosinophils Relative 0 %  Eosinophils Absolute 0.0 0.0 - 0.5 K/uL   Basophils  Relative 0 %   Basophils Absolute 0.1 0.0 - 0.1 K/uL   Immature Granulocytes 1 %   Abs Immature Granulocytes 0.16 (H) 0.00 - 0.07 K/uL    Comment: Performed at [facility], [address] [place], [place] [address]  Comprehensive metabolic panel     Status: Abnormal   Collection Time: 02/12/22  5:40 PM  Result Value Ref Range   Sodium 137 135 - 145 mmol/L   Potassium 3.1 (L) 3.5 - 5.1 mmol/L   Chloride 104 98 - 111 mmol/L   CO2 22 22 - 32 mmol/L   Glucose, Bld 112 (H) 70 - 99 mg/dL    Comment: Glucose reference range applies only to samples taken after fasting for at least 8 hours.   BUN 20 6 - 20 mg/dL   Creatinine, Ser 3.38 (H) 0.44 - 1.00 mg/dL   Calcium 8.3 (L) 8.9 - 10.3 mg/dL   Total Protein 7.3 6.5 - 8.1 g/dL   Albumin 3.6 3.5 - 5.0 g/dL   AST 19 15 - 41 U/L   ALT 20 0 - 44 U/L   Alkaline Phosphatase 88 38 - 126 U/L   Total Bilirubin 0.7 0.3 - 1.2 mg/dL   GFR, Estimated 55 (L) >60 mL/min    Comment: (NOTE) Calculated using the CKD-EPI Creatinine Equation (2021)    Anion gap 11 5 - 15    Comment: Performed at [facility], [address] [place], [place] [address]  Lipase, blood     Status: None   Collection Time: 02/12/22  5:40 PM  Result Value Ref Range   Lipase 19 11 - 51 U/L    Comment: Performed at [facility], [address] [place], [place] [address]  Resp Panel by RT-PCR (Flu A&B, Covid) Anterior Nasal Swab     Status: None   Collection Time: 02/12/22  9:11 PM   Specimen: Anterior Nasal Swab  Result Value Ref Range   SARS Coronavirus 2 by RT PCR NEGATIVE NEGATIVE    Comment: (NOTE) SARS-CoV-2 target nucleic acids are NOT DETECTED.  The SARS-CoV-2 RNA is generally detectable in upper respiratory specimens during the acute phase of infection. The lowest concentration of SARS-CoV-2 viral copies this assay can detect is 138 copies/mL. A negative result does not preclude SARS-Cov-2 infection and should not be used as the sole basis for  treatment or other patient management decisions. A negative result may occur with  improper specimen collection/handling, submission of specimen other than nasopharyngeal swab, presence of viral mutation(s) within the areas targeted by this assay, and inadequate number of viral copies(<138 copies/mL). A negative result must be combined with clinical observations, patient history, and epidemiological information. The expected result is Negative.  Fact Sheet for Patients:  BloggerCourse.com  Fact Sheet for Healthcare Providers:  SeriousBroker.it  This test is no t yet approved or cleared by the [place] FDA and  has been authorized for detection and/or diagnosis of SARS-CoV-2 by FDA under an Emergency Use Authorization (EUA). This EUA will remain  in effect (meaning this test can be used) for the duration of the COVID-19 declaration under Section 564(b)(1) of the Act, 21 U.S.C.section 360bbb-3(b)(1), unless the authorization is terminated  or revoked sooner.       Influenza A by PCR NEGATIVE NEGATIVE   Influenza B by PCR NEGATIVE NEGATIVE    Comment: (NOTE) The Xpert Xpress SARS-CoV-2/FLU/RSV plus assay is  intended as an aid in the diagnosis of influenza from Nasopharyngeal swab specimens and should not be used as a sole basis for treatment. Nasal washings and aspirates are unacceptable for Xpert Xpress SARS-CoV-2/FLU/RSV testing.  Fact Sheet for Patients: BloggerCourse.comhttps://www.fda.gov/media/152166/download  Fact Sheet for Healthcare Providers: SeriousBroker.ithttps://www.fda.gov/media/152162/download  This test is not yet approved or cleared by the [place] FDA and has been authorized for detection and/or diagnosis of SARS-CoV-2 by FDA under an Emergency Use Authorization (EUA). This EUA will remain in effect (meaning this test can be used) for the duration of the COVID-19 declaration under Section 564(b)(1) of the Act, 21 U.S.C. section  360bbb-3(b)(1), unless the authorization is terminated or revoked.  Performed at [facility], [address] [place], [place] [address]   Lactic acid, plasma     Status: None   Collection Time: 02/12/22  9:46 PM  Result Value Ref Range   Lactic Acid, Venous 1.3 0.5 - 1.9 mmol/L    Comment: Performed at [facility], [address] [place], [place] [address]  Protime-INR     Status: None   Collection Time: 02/12/22  9:46 PM  Result Value Ref Range   Prothrombin Time 14.7 11.4 - 15.2 seconds   INR 1.2 0.8 - 1.2    Comment: (NOTE) INR goal varies based on device and disease states. Performed at [facility], [address] [place], [place] [address]   Urinalysis, Routine w reflex microscopic     Status: Abnormal   Collection Time: 02/13/22 12:12 AM  Result Value Ref Range   Color, Urine AMBER (A) YELLOW    Comment: BIOCHEMICALS MAY BE AFFECTED BY COLOR   APPearance HAZY (A) CLEAR   Specific Gravity, Urine 1.020 1.005 - 1.030   pH 5.0 5.0 - 8.0   Glucose, UA NEGATIVE NEGATIVE mg/dL   Hgb urine dipstick SMALL (A) NEGATIVE   Bilirubin Urine NEGATIVE NEGATIVE   Ketones, ur 5 (A) NEGATIVE mg/dL   Protein, ur 914100 (A) NEGATIVE mg/dL   Nitrite NEGATIVE NEGATIVE   Leukocytes,Ua TRACE (A) NEGATIVE   RBC / HPF 11-20 0 - 5 RBC/hpf   WBC, UA 6-10 0 - 5 WBC/hpf   Bacteria, UA RARE (A) NONE SEEN   Squamous Epithelial / LPF 0-5 0 - 5   Mucus PRESENT    Hyaline Casts, UA PRESENT     Comment: Performed at [facility], [address] [place], [place] [address]    US Abdomen Limited  Result Date: 02/12/2022 CLINICAL DATA:  Right upper quadrant pain EXAM: ULTRASOUND ABDOMEN LIMITED RIGHT UPPER QUADRANT COMPARISON:  CT 02/12/2022 FINDINGS: Gallbladder: Multiple large gallstones. Gallbladder sludge with gallbladder wall septation and pericholecystic fluid. Murphy's sign is negative. Common bile duct: Diameter: 5 mm, normal Liver: No focal lesion identified. Within  normal limits in parenchymal echogenicity. Portal vein is patent on color Doppler imaging with normal direction of blood flow towards the liver. Other: None. IMPRESSION: Abnormal gallbladder with cholelithiasis, sludge, and pericholecystic edema. Murphy's sign is negative. Electronically Signed   By: Burman NievesWilliam  Stevens M.D.   On: 02/12/2022 23:10   DG Chest Port 1 View  Result Date: 02/12/2022 CLINICAL DATA:  Question of sepsis. EXAM: PORTABLE CHEST 1 VIEW COMPARISON:  12/27/2016 FINDINGS: Shallow inspiration with atelectasis or fibrosis in the lung bases. Heart size is normal for technique. No pleural effusions. No pneumothorax. No focal consolidation. Gas-filled colon in the upper abdomen suggests ileus. IMPRESSION: Shallow inspiration with linear atelectasis or  fibrosis in the lung bases. Electronically Signed   By: Burman Nieves M.D.   On: 02/12/2022 21:28   CT Renal Stone Study  Result Date: 02/12/2022 CLINICAL DATA:  LEFT flank and back pain question kidney stone, increased urinary frequency for 2 weeks, nauseous, fever H, history of kidney stones EXAM: CT ABDOMEN AND PELVIS WITHOUT CONTRAST TECHNIQUE: Multidetector CT imaging of the abdomen and pelvis was performed following the standard protocol without IV contrast. RADIATION DOSE REDUCTION: This exam was performed according to the departmental dose-optimization program which includes automated exposure control, adjustment of the mA and/or kV according to patient size and/or use of iterative reconstruction technique. COMPARISON:  04/07/2010 FINDINGS: Lower chest: RIGHT basilar atelectasis Hepatobiliary: Abnormal enlarged gallbladder containing calculus, wall thickening, and pericholecystic infiltration highly suspicious for acute cholecystitis. Cannot exclude pericholecystic fluid collection. Liver unremarkable. No definite biliary dilatation. Pancreas: Normal appearance Spleen: Normal appearance Adrenals/Urinary Tract: Adrenal glands, kidneys, and  ureters normal appearance. Bladder decompressed. Stomach/Bowel: Stomach mildly distended by gas. Large and small bowel loops unremarkable. Normal appendix. Vascular/Lymphatic: Atherosclerotic calcifications aorta without aneurysm. No adenopathy. Reproductive: Retroverted uterus.  No adnexal masses. Other: No free air or free fluid.  No hernia. Musculoskeletal: Prior lumbar fusion L4-L5. Degenerative disc disease changes L5-S1. IMPRESSION: Abnormally enlarged gallbladder containing calculus, wall thickening, and pericholecystic infiltration highly suspicious for acute cholecystitis. Cannot exclude a discrete pericholecystic fluid collection; recommend correlation with ultrasound. Aortic Atherosclerosis (ICD10-I70.0). Electronically Signed   By: Ulyses Southward M.D.   On: 02/12/2022 17:46    I have personally reviewed the relevant CBC, Lipase and CMP 02/12/22; CT 02/12/22, RUQ [place] 02/12/22  A/P: Martha Buck is an 61 y.o. female with acute cholecystitis  -Admit to ward -NPO -MIVF  -Empiric IV abx -Repeat CBC, CMP in AM -Cholecystectomy vs percutaneous cholecystostomy tube will be ultimate plan - will review with Dr. Janee Morn in AM whom is on call for our group during the day this week  All the above has been reviewed with the patient, her questions answered and she expressed understanding and agreement with the plan  I spent a total of 75 minutes in both face-to-face and non-face-to-face activities, excluding procedures performed, for this visit on the date of this encounter.  Marin Olp, MD [facility], [facility]

## 2022-02-13 NOTE — Consult Note (Signed)
Chief Complaint: Acute cholecystitis  Referring Physician(s): Georganna Skeans  Supervising Physician: Corrie Mckusick  Patient Status: [facility] - Out-pt  History of Present Illness: Martha Buck is a 61 y.o. female who presented to the ED yesterday with RUQ pain which has been worsening over the past 3 days.  US showed = Abnormal gallbladder with cholelithiasis, sludge, and pericholecystic edema.   She has been evaluated by the General Surgery team. Their note reads= Long standing symptoms and very thick GB wall, cholecystectomy would be quite risky at this point with high chance of subtotal cholecystectomy and bile leak as well as other complications. Plan IR perc chole tube then interval lap chole later.  We are asked to perform a percutaneous cholecystostomy.  She is NPO. No blood thinners. No nausea/vomiting. No Fever/chills. ROS negative.  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   History of kidney stones    hx of   History of pneumonia    Joint pain     Past Surgical History:  Procedure Laterality Date   BACK SURGERY     ECTOPIC PREGNANCY SURGERY  1991   KNEE ARTHROSCOPY WITH MEDIAL MENISECTOMY Right 11/26/2012   Procedure: KNEE ARTHROSCOPY WITH PARTIAL MEDIAL MENISECTOMY,DEBRIDEMENT;  Surgeon: Johnn Hai, MD;  Location: [place];  Service: Orthopedics;  Laterality: Right;   KNEE CLOSED REDUCTION Right 01/27/2017   Procedure: CLOSED MANIPULATION RIGHT KNEE;  Surgeon: Renette Butters, MD;  Location: [place];  Service: Orthopedics;  Laterality: Right;   LUMBAR FUSION  03-20-2010   L4 -- L5   TOTAL KNEE ARTHROPLASTY Right 11/11/2016   Procedure: TOTAL KNEE ARTHROPLASTY;  Surgeon: Renette Butters, MD;  Location: [place];  Service: Orthopedics;  Laterality: Right;    Allergies: Bee venom, Aspirin, Lyrica [pregabalin], and Morphine and  related  Medications: Prior to Admission medications   Medication Sig Start Date End Date Taking? Authorizing Provider  betamethasone dipropionate (DIPROLENE) 0.05 % ointment Apply 1 Application topically in the morning and at bedtime. Both legs 09/26/21  Yes [provider]  cetirizine (ZYRTEC) 10 MG tablet Take 10 mg by mouth daily.   Yes [provider]  cyclobenzaprine (FLEXERIL) 10 MG tablet Take 0.5-1 tablets (5-10 mg total) by mouth 2 (two) times daily as needed for muscle spasms. 03/22/18  Yes Harris, Abigail, PA-C  DULoxetine (CYMBALTA) 60 MG capsule Take 60 mg by mouth daily. 01/25/22  Yes [provider]  fluticasone (FLONASE) 50 MCG/ACT nasal spray Place 1-2 sprays into both nostrils daily. 06/03/20  Yes Wieters, Hallie C, PA-C  gabapentin (NEURONTIN) 600 MG tablet Take 600 mg by mouth 2 (two) times daily. 01/25/22  Yes [provider]  naloxone (NARCAN) nasal spray 4 mg/0.1 mL Place 1 spray into the nose once as needed (opiod overdose). 01/25/22  Yes [provider]  oxyCODONE (ROXICODONE) 15 MG immediate release tablet Take 15 mg by mouth 5 (five) times daily as needed for severe pain. 01/25/22  Yes [provider]  pravastatin (PRAVACHOL) 10 MG tablet Take 10 mg by mouth daily. 12/26/21  Yes [provider]     Family History  Problem Relation Age of Onset   Diabetes Mellitus II Mother    Hypertension Mother     Social History   Socioeconomic History   Marital status: Divorced    Spouse name: Not on file  Number of children: Not on file   Years of education: Not on file   Highest education level: Not on file  Occupational History   Not on file  Tobacco Use   Smoking status: Every Day    Packs/day: 0.25    Years: 34.00    Total pack years: 8.50    Types: Cigarettes   Smokeless tobacco: Never   Tobacco comments:    smokes 4 cigs per day  Vaping Use   Vaping Use: Never used  Substance and Sexual Activity    Alcohol use: Yes    Comment: socially   Drug use: No   Sexual activity: Not on file  Other Topics Concern   Not on file  Social History Narrative   Not on file   Social Determinants of Health   Financial Resource Strain: Not on file  Food Insecurity: Not on file  Transportation Needs: Not on file  Physical Activity: Not on file  Stress: Not on file  Social Connections: Not on file     Review of Systems: A 12 point ROS discussed and pertinent positives are indicated in the HPI above.  All other systems are negative.  Review of Systems  Vital Signs: BP 100/69 (BP Location: Right Arm)   Pulse 78   Temp 98.6 F (37 C) (Oral)   Resp 17   Ht 5' 8.5" (1.74 m)   Wt 214 lb 1.1 oz (97.1 kg)   LMP 04/26/2012   SpO2 96%   BMI 32.08 kg/m   Physical Exam Vitals reviewed.  Constitutional:      Appearance: Normal appearance.  HENT:     Head: Normocephalic and atraumatic.  Eyes:     Extraocular Movements: Extraocular movements intact.  Cardiovascular:     Rate and Rhythm: Normal rate and regular rhythm.  Pulmonary:     Effort: Pulmonary effort is normal. No respiratory distress.     Breath sounds: Normal breath sounds.  Abdominal:     Tenderness: There is abdominal tenderness.     Comments: RUQ tenderness  Musculoskeletal:        General: Normal range of motion.     Cervical back: Normal range of motion.  Skin:    General: Skin is warm and dry.  Neurological:     General: No focal deficit present.     Mental Status: She is alert and oriented to person, place, and time.  Psychiatric:        Mood and Affect: Mood normal.        Behavior: Behavior normal.        Thought Content: Thought content normal.        Judgment: Judgment normal.     Imaging: US Abdomen Limited  Result Date: 02/12/2022 CLINICAL DATA:  Right upper quadrant pain EXAM: ULTRASOUND ABDOMEN LIMITED RIGHT UPPER QUADRANT COMPARISON:  CT 02/12/2022 FINDINGS: Gallbladder: Multiple large gallstones.  Gallbladder sludge with gallbladder wall septation and pericholecystic fluid. Murphy's sign is negative. Common bile duct: Diameter: 5 mm, normal Liver: No focal lesion identified. Within normal limits in parenchymal echogenicity. Portal vein is patent on color Doppler imaging with normal direction of blood flow towards the liver. Other: None. IMPRESSION: Abnormal gallbladder with cholelithiasis, sludge, and pericholecystic edema. Murphy's sign is negative. Electronically Signed   By: Burman Nieves M.D.   On: 02/12/2022 23:10   DG Chest Port 1 View  Result Date: 02/12/2022 CLINICAL DATA:  Question of sepsis. EXAM: PORTABLE CHEST 1 VIEW COMPARISON:  12/27/2016 FINDINGS:  Shallow inspiration with atelectasis or fibrosis in the lung bases. Heart size is normal for technique. No pleural effusions. No pneumothorax. No focal consolidation. Gas-filled colon in the upper abdomen suggests ileus. IMPRESSION: Shallow inspiration with linear atelectasis or fibrosis in the lung bases. Electronically Signed   By: Burman Nieves M.D.   On: 02/12/2022 21:28   CT Renal Stone Study  Result Date: 02/12/2022 CLINICAL DATA:  LEFT flank and back pain question kidney stone, increased urinary frequency for 2 weeks, nauseous, fever H, history of kidney stones EXAM: CT ABDOMEN AND PELVIS WITHOUT CONTRAST TECHNIQUE: Multidetector CT imaging of the abdomen and pelvis was performed following the standard protocol without IV contrast. RADIATION DOSE REDUCTION: This exam was performed according to the departmental dose-optimization program which includes automated exposure control, adjustment of the mA and/or kV according to patient size and/or use of iterative reconstruction technique. COMPARISON:  04/07/2010 FINDINGS: Lower chest: RIGHT basilar atelectasis Hepatobiliary: Abnormal enlarged gallbladder containing calculus, wall thickening, and pericholecystic infiltration highly suspicious for acute cholecystitis. Cannot exclude  pericholecystic fluid collection. Liver unremarkable. No definite biliary dilatation. Pancreas: Normal appearance Spleen: Normal appearance Adrenals/Urinary Tract: Adrenal glands, kidneys, and ureters normal appearance. Bladder decompressed. Stomach/Bowel: Stomach mildly distended by gas. Large and small bowel loops unremarkable. Normal appendix. Vascular/Lymphatic: Atherosclerotic calcifications aorta without aneurysm. No adenopathy. Reproductive: Retroverted uterus.  No adnexal masses. Other: No free air or free fluid.  No hernia. Musculoskeletal: Prior lumbar fusion L4-L5. Degenerative disc disease changes L5-S1. IMPRESSION: Abnormally enlarged gallbladder containing calculus, wall thickening, and pericholecystic infiltration highly suspicious for acute cholecystitis. Cannot exclude a discrete pericholecystic fluid collection; recommend correlation with ultrasound. Aortic Atherosclerosis (ICD10-I70.0). Electronically Signed   By: Ulyses Southward M.D.   On: 02/12/2022 17:46    Labs:  CBC: Recent Labs    02/12/22 1740  WBC 25.4*  HGB 12.0  HCT 36.8  PLT 265    COAGS: Recent Labs    02/12/22 2146  INR 1.2    BMP: Recent Labs    02/12/22 1740 02/13/22 0312  NA 137 141  K 3.1* 3.0*  CL 104 105  CO2 22 25  GLUCOSE 112* 100*  BUN 20 14  CALCIUM 8.3* 8.1*  CREATININE 1.14* 0.87  GFRNONAA 55* >60    LIVER FUNCTION TESTS: Recent Labs    02/12/22 1740 02/13/22 0312  BILITOT 0.7 0.7  AST 19 15  ALT 20 16  ALKPHOS 88 63  PROT 7.3 5.8*  ALBUMIN 3.6 2.7*    TUMOR MARKERS: No results for input(s): "AFPTM", "CEA", "CA199", "CHROMGRNA" in the last 8760 hours.  Assessment and Plan:  Acute cholecystitis = not currently a surgical candidate, but surgery plans Lap Chole in ~6 weeks.   Will proceed with image guided placement of a percutaneous cholecystostomy today by Dr. Loreta Ave.  Risks and benefits discussed with the patient including, but not limited to bleeding, infection,  gallbladder perforation, bile leak, sepsis or even death.  All of the patient's questions were answered, patient is agreeable to proceed. Consent signed and in chart.  Percutaneous cholecystostomy drain to remain in place at least 6 weeks.   Recommend fluoroscopy with injection of the drain in IR to evaluate for patency of the cystic duct.  If the duct is patent and general surgery feels patient is stable for cholecystectomy, the drain would be removed at time of surgery.  Please call the IR PA at [phone] when patient is about to be discharged and we will arrange the follow up drain  injection (ok to leave message).   Thank you for allowing our service to participate in Martha Buck 's care.  Electronically Signed: Gwynneth Macleod, PA-C   02/13/2022, 10:38 AM      I spent a total of 40 Minutes  in face to face in clinical consultation, greater than 50% of which was counseling/coordinating care for perc chole.

## 2022-02-13 NOTE — TOC CM/SW Note (Signed)
  Transition of Care [facility]) Screening Note   Patient Details  Name: Martha Buck Date of Birth: 25-Oct-1960     Transition of Care Department [facility]) has reviewed patient and no TOC needs have been identified at this time. We will continue to monitor patient advancement through interdisciplinary progression rounds. If new patient transition needs arise, please place a TOC consult.

## 2022-02-13 NOTE — Progress Notes (Signed)
Arrived to 6n24 via stretcher from ED. Ambulated to bathroom using walker. C/O RUQ pain, denies nausea.

## 2022-02-13 NOTE — Procedures (Signed)
Interventional Radiology Procedure Note  Procedure: Image guided drain placement, perc chole.  64F pigtail drain.  Frankly purulent fluid.   Complications: None  EBL: None Sample: Culture sent,   Recommendations: - Routine drain care, with sterile flushes, record output - follow up Cx - routine wound care  Signed,  Yvone Neu. Loreta Ave, DO

## 2022-02-13 NOTE — Plan of Care (Signed)

## 2022-02-13 NOTE — Progress Notes (Signed)
[name], female   DOB: [birth year], 61 y.o.   MRN: [id number]      Subjective: C/O RUQ pain ROS negative except as listed above. Objective: Vital signs in last 24 hours: Temp:  [97.6 F (36.4 C)-100.3 F (37.9 C)] 98.6 F (37 C) (06/29 0759) Pulse Rate:  [68-109] 78 (06/29 0759) Resp:  [16-21] 17 (06/29 0759) BP: (96-116)/(61-76) 100/69 (06/29 0759) SpO2:  [91 %-100 %] 96 % (06/29 0759) Weight:  [97.1 kg] 97.1 kg (06/28 1704) Last BM Date : 02/12/22  Intake/Output from previous day: 06/28 0701 - 06/29 0700 In: 3700 [I.V.:500; IV Piggyback:3200] Out: -  Intake/Output this shift: No intake/output data recorded.  General appearance: alert and cooperative Resp: clear to auscultation bilaterally Cardio: regular rate and rhythm GI: quite tender RUQ, no peritonitis  Lab Results: CBC  Recent Labs    02/12/22 1740  WBC 25.4*  HGB 12.0  HCT 36.8  PLT 265   BMET Recent Labs    02/12/22 1740 02/13/22 0312  NA 137 141  K 3.1* 3.0*  CL 104 105  CO2 22 25  GLUCOSE 112* 100*  BUN 20 14  CREATININE 1.14* 0.87  CALCIUM 8.3* 8.1*   PT/INR Recent Labs    02/12/22 2146  LABPROT 14.7  INR 1.2   ABG No results for input(s): "PHART", "HCO3" in the last 72 hours.  Invalid input(s): "PCO2", "PO2"  Studies/Results: US Abdomen Limited  Result Date: 02/12/2022 CLINICAL DATA:  Right upper quadrant pain EXAM: ULTRASOUND ABDOMEN LIMITED RIGHT UPPER QUADRANT COMPARISON:  CT 02/12/2022 FINDINGS: Gallbladder: Multiple large gallstones. Gallbladder sludge with gallbladder wall septation and pericholecystic fluid. Murphy's sign is negative. Common bile duct: Diameter: 5 mm, normal Liver: No focal lesion identified. Within normal limits in parenchymal echogenicity. Portal vein is patent on color Doppler imaging with normal direction of blood flow towards the liver. Other: None. IMPRESSION: Abnormal gallbladder with cholelithiasis, sludge, and pericholecystic  edema. Murphy's sign is negative. Electronically Signed   By: Burman Nieves M.D.   On: 02/12/2022 23:10   DG Chest Port 1 View  Result Date: 02/12/2022 CLINICAL DATA:  Question of sepsis. EXAM: PORTABLE CHEST 1 VIEW COMPARISON:  12/27/2016 FINDINGS: Shallow inspiration with atelectasis or fibrosis in the lung bases. Heart size is normal for technique. No pleural effusions. No pneumothorax. No focal consolidation. Gas-filled colon in the upper abdomen suggests ileus. IMPRESSION: Shallow inspiration with linear atelectasis or fibrosis in the lung bases. Electronically Signed   By: Burman Nieves M.D.   On: 02/12/2022 21:28   CT Renal Stone Study  Result Date: 02/12/2022 CLINICAL DATA:  LEFT flank and back pain question kidney stone, increased urinary frequency for 2 weeks, nauseous, fever H, history of kidney stones EXAM: CT ABDOMEN AND PELVIS WITHOUT CONTRAST TECHNIQUE: Multidetector CT imaging of the abdomen and pelvis was performed following the standard protocol without IV contrast. RADIATION DOSE REDUCTION: This exam was performed according to the departmental dose-optimization program which includes automated exposure control, adjustment of the mA and/or kV according to patient size and/or use of iterative reconstruction technique. COMPARISON:  04/07/2010 FINDINGS: Lower chest: RIGHT basilar atelectasis Hepatobiliary: Abnormal enlarged gallbladder containing calculus, wall thickening, and pericholecystic infiltration highly suspicious for acute cholecystitis. Cannot exclude pericholecystic fluid collection. Liver unremarkable. No definite biliary dilatation. Pancreas: Normal appearance Spleen: Normal appearance Adrenals/Urinary Tract: Adrenal glands, kidneys, and ureters normal appearance. Bladder decompressed. Stomach/Bowel: Stomach mildly distended by gas. Large and small bowel loops unremarkable. Normal appendix. Vascular/Lymphatic: Atherosclerotic calcifications  aorta without aneurysm. No  adenopathy. Reproductive: Retroverted uterus.  No adnexal masses. Other: No free air or free fluid.  No hernia. Musculoskeletal: Prior lumbar fusion L4-L5. Degenerative disc disease changes L5-S1. IMPRESSION: Abnormally enlarged gallbladder containing calculus, wall thickening, and pericholecystic infiltration highly suspicious for acute cholecystitis. Cannot exclude a discrete pericholecystic fluid collection; recommend correlation with ultrasound. Aortic Atherosclerosis (ICD10-I70.0). Electronically Signed   By: Ulyses Southward M.D.   On: 02/12/2022 17:46    Anti-infectives: Anti-infectives (From admission, onward)    Start     Dose/Rate Route Frequency Ordered Stop   02/13/22 0200  piperacillin-tazobactam (ZOSYN) IVPB 3.375 g        3.375 g 12.5 mL/hr over 240 Minutes Intravenous Every 8 hours 02/13/22 0143     02/12/22 2115  cefTRIAXone (ROCEPHIN) 2 g in sodium chloride 0.9 % 100 mL IVPB        2 g 200 mL/hr over 30 Minutes Intravenous  Once 02/12/22 2112 02/12/22 2256   02/12/22 2115  metroNIDAZOLE (FLAGYL) IVPB 500 mg        500 mg 100 mL/hr over 60 Minutes Intravenous  Once 02/12/22 2112 02/12/22 2348       Assessment/Plan: Cholecystitis - Rocephin and flagyl - LFTs WNL - long standing symptoms and very thick GB wall, cholecystectomy would be quite risky at this point with high chance of subtotal cholecystectomy and bile leak as well as other complications. Plan IR perc chole tube then interval lap chole later. I discussed this with her in detail. - NPO except meds  VTE - hold LMWH for procedure   LOS: 0 days    Violeta Gelinas, MD, MPH, FACS Trauma & General Surgery Use [email or web address] to contact on call provider  02/13/2022

## 2022-02-13 NOTE — Progress Notes (Signed)
Mobility Specialist Progress Note:   02/13/22 1000  Mobility  Activity Ambulated with assistance in room  Level of Assistance Contact guard assist, steadying assist  Assistive Device Other (Comment) (IV Pole)  Distance Ambulated (ft) 15 ft  Activity Response Tolerated well  $Mobility charge 1 Mobility   Pt received in BR. MinG assist given throughout for safety. Pt left sitting EOB with PA present. Bed alarm on.   Add[facility] L[facility] Secure Chat or Office Phone: [phone]

## 2022-02-13 NOTE — Progress Notes (Signed)
Mobility Specialist Progress Note:   02/13/22 0945  Mobility  Activity Ambulated with assistance to bathroom  Level of Assistance Contact guard assist, steadying assist  Assistive Device Other (Comment) (IV Pole)  Distance Ambulated (ft) 15 ft  Activity Response Tolerated well  $Mobility charge 1 Mobility   Responded to bed alarm, pt found ambulating to BR with IV Pole. Pt unable to heel strike with RLE, states since TKA in 2018. Pt became tearful when speaking about it. Pt left in BR, educated to pull string when done. Will f/u to return to bed.   Addison Lank [facility] Secure Chat or Office Phone: [phone]

## 2022-02-14 ENCOUNTER — Other Ambulatory Visit (HOSPITAL_COMMUNITY): Payer: Self-pay | Admitting: Radiology

## 2022-02-14 DIAGNOSIS — K810 Acute cholecystitis: Secondary | ICD-10-CM

## 2022-02-14 DIAGNOSIS — K8020 Calculus of gallbladder without cholecystitis without obstruction: Secondary | ICD-10-CM

## 2022-02-14 LAB — URINE CULTURE

## 2022-02-14 LAB — COMPREHENSIVE METABOLIC PANEL
ALT: 20 U/L (ref 0–44)
AST: 18 U/L (ref 15–41)
Albumin: 2.9 g/dL — ABNORMAL LOW (ref 3.5–5.0)
Alkaline Phosphatase: 65 U/L (ref 38–126)
Anion gap: 9 (ref 5–15)
BUN: 7 mg/dL (ref 6–20)
CO2: 27 mmol/L (ref 22–32)
Calcium: 8.1 mg/dL — ABNORMAL LOW (ref 8.9–10.3)
Chloride: 102 mmol/L (ref 98–111)
Creatinine, Ser: 0.83 mg/dL (ref 0.44–1.00)
GFR, Estimated: >60 mL/min (ref >60)
Glucose, Bld: 101 mg/dL — ABNORMAL HIGH (ref 70–99)
Potassium: 3 mmol/L — ABNORMAL LOW (ref 3.5–5.1)
Sodium: 138 mmol/L (ref 135–145)
Total Bilirubin: 0.5 mg/dL (ref 0.3–1.2)
Total Protein: 6.2 g/dL — ABNORMAL LOW (ref 6.5–8.1)

## 2022-02-14 LAB — CBC
HCT: 33.2 % — ABNORMAL LOW (ref 36.0–46.0)
Hemoglobin: 11.1 g/dL — ABNORMAL LOW (ref 12.0–15.0)
MCH: 32.6 pg (ref 26.0–34.0)
MCHC: 33.4 g/dL (ref 30.0–36.0)
MCV: 97.4 fL (ref 80.0–100.0)
Platelets: 218 10*3/uL (ref 150–400)
RBC: 3.41 MIL/uL — ABNORMAL LOW (ref 3.87–5.11)
RDW: 13.8 % (ref 11.5–15.5)
WBC: 7.5 10*3/uL (ref 4.0–10.5)
nRBC: 0 % (ref 0.0–0.2)

## 2022-02-14 LAB — MAGNESIUM: Magnesium: 2 mg/dL (ref 1.7–2.4)

## 2022-02-14 MED ORDER — METHOCARBAMOL 500 MG PO TABS
500.0000 mg | ORAL_TABLET | Freq: Three times a day (TID) | ORAL | Status: DC
  Administered 2022-02-14: 500 mg via ORAL
  Filled 2022-02-14: qty 1

## 2022-02-14 MED ORDER — POLYETHYLENE GLYCOL 3350 17 G PO PACK
17.0000 g | PACK | Freq: Every day | ORAL | Status: DC
  Administered 2022-02-14: 17 g via ORAL
  Filled 2022-02-14 (×2): qty 1

## 2022-02-14 MED ORDER — POTASSIUM CHLORIDE 20 MEQ PO PACK
40.0000 meq | PACK | Freq: Two times a day (BID) | ORAL | Status: AC
Start: 2022-02-14 — End: 2022-02-14
  Administered 2022-02-14 (×2): 40 meq via ORAL
  Filled 2022-02-14 (×2): qty 2

## 2022-02-14 MED ORDER — OXYCODONE HCL 5 MG PO TABS
15.0000 mg | ORAL_TABLET | ORAL | Status: DC | PRN
  Administered 2022-02-14 – 2022-02-15 (×3): 15 mg via ORAL
  Filled 2022-02-14 (×3): qty 3

## 2022-02-14 MED ORDER — PRAVASTATIN SODIUM 10 MG PO TABS
10.0000 mg | ORAL_TABLET | Freq: Every day | ORAL | Status: DC
  Administered 2022-02-14 – 2022-02-17 (×4): 10 mg via ORAL
  Filled 2022-02-14 (×4): qty 1

## 2022-02-14 MED ORDER — DOCUSATE SODIUM 100 MG PO CAPS
100.0000 mg | ORAL_CAPSULE | Freq: Two times a day (BID) | ORAL | Status: DC
  Administered 2022-02-14 – 2022-02-17 (×7): 100 mg via ORAL
  Filled 2022-02-14 (×7): qty 1

## 2022-02-14 MED ORDER — ENOXAPARIN SODIUM 40 MG/0.4ML IJ SOSY
40.0000 mg | PREFILLED_SYRINGE | INTRAMUSCULAR | Status: DC
  Administered 2022-02-14 – 2022-02-17 (×4): 40 mg via SUBCUTANEOUS
  Filled 2022-02-14 (×4): qty 0.4

## 2022-02-14 MED ORDER — CYCLOBENZAPRINE HCL 10 MG PO TABS
10.0000 mg | ORAL_TABLET | Freq: Two times a day (BID) | ORAL | Status: DC
  Administered 2022-02-14 – 2022-02-17 (×7): 10 mg via ORAL
  Filled 2022-02-14 (×7): qty 1

## 2022-02-14 MED ORDER — OXYCODONE HCL 5 MG PO TABS
5.0000 mg | ORAL_TABLET | ORAL | Status: DC | PRN
  Administered 2022-02-14: 10 mg via ORAL
  Filled 2022-02-14: qty 2

## 2022-02-14 MED ORDER — LACTATED RINGERS IV SOLN: INTRAVENOUS | Status: DC

## 2022-02-14 MED ORDER — BISACODYL 10 MG RE SUPP: 10.0000 mg | Freq: Every day | RECTAL | Status: DC | PRN

## 2022-02-14 MED ORDER — DULOXETINE HCL 60 MG PO CPEP
60.0000 mg | ORAL_CAPSULE | Freq: Every day | ORAL | Status: DC
  Administered 2022-02-14 – 2022-02-17 (×4): 60 mg via ORAL
  Filled 2022-02-14 (×4): qty 1

## 2022-02-14 MED ORDER — HYDROMORPHONE HCL 1 MG/ML IJ SOLN
0.5000 mg | INTRAMUSCULAR | Status: DC | PRN
  Administered 2022-02-15: 0.5 mg via INTRAVENOUS
  Filled 2022-02-14: qty 0.5

## 2022-02-14 NOTE — Progress Notes (Signed)
Referring Physician(s): * No referring provider recorded for this case *  Supervising Physician: Marliss Coots  Patient Status:  [facility] - In-pt  Chief Complaint:  Acute cholecystitis s/p cholecystomy tube placement on 6.29.23 by Dr. Loreta Ave  Subjective:   Patient reports some "sore"ness around the cholecystitis exit site and suprapubic pain.   Allergies: Bee venom, Aspirin, Lyrica [pregabalin], and Morphine and related  Medications: Prior to Admission medications   Medication Sig Start Date End Date Taking? Authorizing Provider  betamethasone dipropionate (DIPROLENE) 0.05 % ointment Apply 1 Application topically in the morning and at bedtime. Both legs 09/26/21  Yes [provider]  cetirizine (ZYRTEC) 10 MG tablet Take 10 mg by mouth daily.   Yes [provider]  cyclobenzaprine (FLEXERIL) 10 MG tablet Take 0.5-1 tablets (5-10 mg total) by mouth 2 (two) times daily as needed for muscle spasms. 03/22/18  Yes Harris, Abigail, PA-C  DULoxetine (CYMBALTA) 60 MG capsule Take 60 mg by mouth daily. 01/25/22  Yes [provider]  fluticasone (FLONASE) 50 MCG/ACT nasal spray Place 1-2 sprays into both nostrils daily. 06/03/20  Yes Wieters, Hallie C, PA-C  gabapentin (NEURONTIN) 600 MG tablet Take 600 mg by mouth 2 (two) times daily. 01/25/22  Yes [provider]  naloxone (NARCAN) nasal spray 4 mg/0.1 mL Place 1 spray into the nose once as needed (opiod overdose). 01/25/22  Yes [provider]  oxyCODONE (ROXICODONE) 15 MG immediate release tablet Take 15 mg by mouth 5 (five) times daily as needed for severe pain. 01/25/22  Yes [provider]  pravastatin (PRAVACHOL) 10 MG tablet Take 10 mg by mouth daily. 12/26/21  Yes [provider]     Vital Signs: BP 119/68 (BP Location: Right Arm)   Pulse (!) 52   Temp 98 F (36.7 C) (Oral)   Resp 18   Ht 5' 8.5" (1.74 m)   Wt 214 lb 1.1 oz (97.1 kg)   LMP 04/26/2012   SpO2 100%   BMI  32.08 kg/m   Physical Exam Vitals and nursing note reviewed.  Constitutional:      Appearance: She is well-developed.  HENT:     Head: Normocephalic and atraumatic.  Eyes:     Conjunctiva/sclera: Conjunctivae normal.  Pulmonary:     Effort: Pulmonary effort is normal.  Abdominal:     Comments: Positive RUQ drain to gravity bag. Site is unremarkable with no erythema, edema, tenderness, bleeding or drainage noted at exit site. Suture in place. Dressing is clean dry and intact. 50 ml of  green colored fluid noted in gravity device. Drain is able to be flushed easily.    Musculoskeletal:        General: Normal range of motion.     Cervical back: Normal range of motion.  Skin:    General: Skin is warm.  Neurological:     Mental Status: She is alert and oriented to person, place, and time.     Imaging: IR Perc Cholecystostomy  Result Date: 02/13/2022 INDICATION: 61 year old female referred for cholecystostomy EXAM: CHOLECYSTOSTOMY MEDICATIONS: None ANESTHESIA/SEDATION: Moderate (conscious) sedation was employed during this procedure. A total of Versed 1.0 mg and Fentanyl 50 mcg was administered intravenously. Moderate Sedation Time: 10 minutes. The patient's level of consciousness and vital signs were monitored continuously by radiology nursing throughout the procedure under my direct supervision. FLUOROSCOPY TIME:  Fluoroscopy Time: 18 minutes 0 seconds (2 mGy). COMPLICATIONS: None PROCEDURE: Informed written consent was obtained from the patient and the patient's  family after a thorough discussion of the procedural risks, benefits and alternatives. All questions were addressed. Maximal Sterile Barrier Technique was utilized including caps, mask, sterile gowns, sterile gloves, sterile drape, hand hygiene and skin antiseptic. A timeout was performed prior to the initiation of the procedure. Ultrasound survey of the right upper quadrant was performed for planning purposes. There appears to be  some pericholecystic fluid potentially abscess. Once the patient is prepped and draped in the usual sterile fashion, the skin and subcutaneous tissues overlying the gallbladder were generously infiltrated 1% lidocaine for local anesthesia. A coaxial needle was advanced under ultrasound guidance through the skin subcutaneous tissues and a small segment of liver into the gallbladder lumen. With removal of the stylet, spontaneous dark bile drainage occurred. Using modified Seldinger technique, a 10 French drain was placed into the gallbladder fossa, with aspiration of the sample for the lab. Contrast injection confirmed position of the tube within the gallbladder lumen. Drainage catheter was attached to gravity drain with a suture retention placed. Patient tolerated the procedure well and remained hemodynamically stable throughout. No complications were encountered and no significant blood loss encountered. IMPRESSION: Status post percutaneous cholecystostomy Signed, Yvone Neu. Miachel Roux, RPVI [facility] Electronically Signed   By: Gilmer Mor D.O.   On: 02/13/2022 12:05   US Abdomen Limited  Result Date: 02/12/2022 CLINICAL DATA:  Right upper quadrant pain EXAM: ULTRASOUND ABDOMEN LIMITED RIGHT UPPER QUADRANT COMPARISON:  CT 02/12/2022 FINDINGS: Gallbladder: Multiple large gallstones. Gallbladder sludge with gallbladder wall septation and pericholecystic fluid. Murphy's sign is negative. Common bile duct: Diameter: 5 mm, normal Liver: No focal lesion identified. Within normal limits in parenchymal echogenicity. Portal vein is patent on color Doppler imaging with normal direction of blood flow towards the liver. Other: None. IMPRESSION: Abnormal gallbladder with cholelithiasis, sludge, and pericholecystic edema. Murphy's sign is negative. Electronically Signed   By: Burman Nieves M.D.   On: 02/12/2022 23:10   DG Chest Port 1 View  Result  Date: 02/12/2022 CLINICAL DATA:  Question of sepsis. EXAM: PORTABLE CHEST 1 VIEW COMPARISON:  12/27/2016 FINDINGS: Shallow inspiration with atelectasis or fibrosis in the lung bases. Heart size is normal for technique. No pleural effusions. No pneumothorax. No focal consolidation. Gas-filled colon in the upper abdomen suggests ileus. IMPRESSION: Shallow inspiration with linear atelectasis or fibrosis in the lung bases. Electronically Signed   By: Burman Nieves M.D.   On: 02/12/2022 21:28   CT Renal Stone Study  Result Date: 02/12/2022 CLINICAL DATA:  LEFT flank and back pain question kidney stone, increased urinary frequency for 2 weeks, nauseous, fever H, history of kidney stones EXAM: CT ABDOMEN AND PELVIS WITHOUT CONTRAST TECHNIQUE: Multidetector CT imaging of the abdomen and pelvis was performed following the standard protocol without IV contrast. RADIATION DOSE REDUCTION: This exam was performed according to the departmental dose-optimization program which includes automated exposure control, adjustment of the mA and/or kV according to patient size and/or use of iterative reconstruction technique. COMPARISON:  04/07/2010 FINDINGS: Lower chest: RIGHT basilar atelectasis Hepatobiliary: Abnormal enlarged gallbladder containing calculus, wall thickening, and pericholecystic infiltration highly suspicious for acute cholecystitis. Cannot exclude pericholecystic fluid collection. Liver unremarkable. No definite biliary dilatation. Pancreas: Normal appearance Spleen: Normal appearance Adrenals/Urinary Tract: Adrenal glands, kidneys, and ureters normal appearance. Bladder decompressed. Stomach/Bowel: Stomach mildly distended by gas. Large and small bowel loops unremarkable. Normal appendix. Vascular/Lymphatic: Atherosclerotic calcifications aorta without aneurysm. No adenopathy. Reproductive: Retroverted uterus.  No adnexal masses. Other: No free  air or free fluid.  No hernia. Musculoskeletal: Prior lumbar fusion  L4-L5. Degenerative disc disease changes L5-S1. IMPRESSION: Abnormally enlarged gallbladder containing calculus, wall thickening, and pericholecystic infiltration highly suspicious for acute cholecystitis. Cannot exclude a discrete pericholecystic fluid collection; recommend correlation with ultrasound. Aortic Atherosclerosis (ICD10-I70.0). Electronically Signed   By: Ulyses Southward M.D.   On: 02/12/2022 17:46    Labs:  CBC: Recent Labs    02/12/22 1740 02/14/22 0920  WBC 25.4* 7.5  HGB 12.0 11.1*  HCT 36.8 33.2*  PLT 265 218    COAGS: Recent Labs    02/12/22 2146  INR 1.2    BMP: Recent Labs    02/12/22 1740 02/13/22 0312 02/14/22 0920  NA 137 141 138  K 3.1* 3.0* 3.0*  CL 104 105 102  CO2 22 25 27   GLUCOSE 112* 100* 101*  BUN 20 14 7   CALCIUM 8.3* 8.1* 8.1*  CREATININE 1.14* 0.87 0.83  GFRNONAA 55* >60 >60    LIVER FUNCTION TESTS: Recent Labs    02/12/22 1740 02/13/22 0312 02/14/22 0920  BILITOT 0.7 0.7 0.5  AST 19 15 18   ALT 20 16 20   ALKPHOS 88 63 65  PROT 7.3 5.8* 6.2*  ALBUMIN 3.6 2.7* 2.9*    Assessment and Plan:  61 y.o. female inpatient. History of RUQ pain. Presented to the ED at [facility] on 6.28.23 with RUQ abdominal pain, nausea, fever and transient numbness. Found to have acute cholecystitis. IR placed a chole tube on 6.29.23.   Drain Location: RUQ Size: Fr size: 10 Fr Date of placement: 6.29.23  Currently to: Drain collection device: gravity 24 hour output:  Output by Drain (mL) 02/12/22 0700 - 02/12/22 1459 02/12/22 1500 - 02/12/22 2259 02/12/22 2300 - 02/13/22 0659 02/13/22 0700 - 02/13/22 1459 02/13/22 1500 - 02/13/22 2259 02/13/22 2300 - 02/14/22 0659 02/14/22 0700 - 02/14/22 1315  Closed System Drain Right RUQ 10.2 Fr.     150 350 200  Cultures shows few gram negative rods. Cultures re incubated for better growth.   Interval imaging/drain manipulation:  None since drain placement  Current examination: Flushes/aspirates easily.   Insertion site unremarkable. Dressed appropriately.   Plan: Continue TID flushes with 5 cc NS. Record output Q shift. Dressing changes QD or PRN if soiled.  Call IR APP or on call IR MD if difficulty flushing or sudden change in drain output.  Repeat imaging/possible drain injection once output < 10 mL/QD (excluding flush material). Consideration for drain removal if output is < 10 mL/QD (excluding flush material), pending discussion with the providing surgical service.  Discharge planning: Please contact IR APP or on call IR MD prior to patient d/c to ensure appropriate follow up plans are in place. Typically patient will follow up with IR clinic 6 - weeks post d/c for repeat imaging/possible drain injection. Outpatient orders have been placed to facilitate. R scheduler will contact patient with date/time of appointment. Patient will need to flush drain QD with 5 cc NS, record output QD, dressing changes every 2-3 days or earlier if soiled.   IR will continue to follow - please call with questions or concerns.  Electronically Signed: 02/15/22, NP 02/14/2022, 1:00 PM   I spent a total of 15 Minutes at the patient's bedside AND on the patient's hospital floor or unit, greater than 50% of which was counseling/coordinating care for chole tube placement.

## 2022-02-14 NOTE — TOC Progression Note (Signed)
Transition of Care [facility]) - Progression Note    Patient Details  Name: Surie Suchocki MRN: [id number] Date of Birth: 06-Jan-1961   Transition of Care [facility]) Screening Note   Patient Details  Name: Taleyah Hillman Guderian Date of Birth: May 30, 1961   Transition of Care [facility]) CM/SW Contact:    Kingsley Plan, RN Phone Number: 02/14/2022, 10:42 AM    Transition of Care Department (TOC) has reviewed patient and no TOC needs have been identified at this time. We will continue to monitor patient advancement through interdisciplinary progression rounds. If new patient transition needs arise, please place a TOC consult.                                                    Social Determinants of Health (SDOH) Interventions    Readmission Risk Interventions     No data to display

## 2022-02-14 NOTE — Progress Notes (Signed)
[name], female   DOB: Oct 28, 1960, 61 y.o.   MRN: [id number] [facility] Surgery Progress Note     Subjective: CC-  Sore after perc chole tube placement yesterday. A little nauseated, no vomiting. Tolerating clear liquids. Mobilized around the room yesterday. Last BM 4 days ago.  Objective: Vital signs in last 24 hours: Temp:  [97.5 F (36.4 C)-98.8 F (37.1 C)] 98 F (36.7 C) (06/30 0734) Pulse Rate:  [52-77] 52 (06/30 0734) Resp:  [13-20] 18 (06/30 0734) BP: (101-131)/(67-86) 119/68 (06/30 0734) SpO2:  [93 %-100 %] 100 % (06/30 0734) Last BM Date : 02/12/22  Intake/Output from previous day: 06/29 0701 - 06/30 0700 In: 1483.4 [P.O.:240; I.V.:1000; IV Piggyback:243.4] Out: 500 [Drains:500] Intake/Output this shift: Total I/O In: 240 [P.O.:240] Out: -   PE: Gen:  Alert, NAD, pleasant Abd: soft, mild distension, focally tender around RUQ drain, drain with bilious fluid in bag  Lab Results:  Recent Labs    02/12/22 1740  WBC 25.4*  HGB 12.0  HCT 36.8  PLT 265   BMET Recent Labs    02/12/22 1740 02/13/22 0312  NA 137 141  K 3.1* 3.0*  CL 104 105  CO2 22 25  GLUCOSE 112* 100*  BUN 20 14  CREATININE 1.14* 0.87  CALCIUM 8.3* 8.1*   PT/INR Recent Labs    02/12/22 2146  LABPROT 14.7  INR 1.2   CMP     Component Value Date/Time   NA 141 02/13/2022 0312   K 3.0 (L) 02/13/2022 0312   CL 105 02/13/2022 0312   CO2 25 02/13/2022 0312   GLUCOSE 100 (H) 02/13/2022 0312   BUN 14 02/13/2022 0312   CREATININE 0.87 02/13/2022 0312   CALCIUM 8.1 (L) 02/13/2022 0312   PROT 5.8 (L) 02/13/2022 0312   ALBUMIN 2.7 (L) 02/13/2022 0312   AST 15 02/13/2022 0312   ALT 16 02/13/2022 0312   ALKPHOS 63 02/13/2022 0312   BILITOT 0.7 02/13/2022 0312   GFRNONAA >60 02/13/2022 0312   GFRAA >60 03/01/2019 1718   Lipase     Component Value Date/Time   LIPASE 19 02/12/2022 1740       Studies/Results: IR Perc Cholecystostomy  Result  Date: 02/13/2022 INDICATION: 61 year old female referred for cholecystostomy EXAM: CHOLECYSTOSTOMY MEDICATIONS: None ANESTHESIA/SEDATION: Moderate (conscious) sedation was employed during this procedure. A total of Versed 1.0 mg and Fentanyl 50 mcg was administered intravenously. Moderate Sedation Time: 10 minutes. The patient's level of consciousness and vital signs were monitored continuously by radiology nursing throughout the procedure under my direct supervision. FLUOROSCOPY TIME:  Fluoroscopy Time: 18 minutes 0 seconds (2 mGy). COMPLICATIONS: None PROCEDURE: Informed written consent was obtained from the patient and the patient's family after a thorough discussion of the procedural risks, benefits and alternatives. All questions were addressed. Maximal Sterile Barrier Technique was utilized including caps, mask, sterile gowns, sterile gloves, sterile drape, hand hygiene and skin antiseptic. A timeout was performed prior to the initiation of the procedure. Ultrasound survey of the right upper quadrant was performed for planning purposes. There appears to be some pericholecystic fluid potentially abscess. Once the patient is prepped and draped in the usual sterile fashion, the skin and subcutaneous tissues overlying the gallbladder were generously infiltrated 1% lidocaine for local anesthesia. A coaxial needle was advanced under ultrasound guidance through the skin subcutaneous tissues and a small segment of liver into the gallbladder lumen. With removal of the stylet, spontaneous dark bile drainage occurred. Using modified Seldinger  technique, a 10 French drain was placed into the gallbladder fossa, with aspiration of the sample for the lab. Contrast injection confirmed position of the tube within the gallbladder lumen. Drainage catheter was attached to gravity drain with a suture retention placed. Patient tolerated the procedure well and remained hemodynamically stable throughout. No complications were  encountered and no significant blood loss encountered. IMPRESSION: Status post percutaneous cholecystostomy Signed, Yvone Neu. Miachel Roux, RPVI [facility]iology Special[facility]iology Electronically Signed   By: Gilmer Mor D.O.   On: 02/13/2022 12:05   US Abdomen Limited  Result Date: 02/12/2022 CLINICAL DATA:  Right upper quadrant pain EXAM: ULTRASOUND ABDOMEN LIMITED RIGHT UPPER QUADRANT COMPARISON:  CT 02/12/2022 FINDINGS: Gallbladder: Multiple large gallstones. Gallbladder sludge with gallbladder wall septation and pericholecystic fluid. Murphy's sign is negative. Common bile duct: Diameter: 5 mm, normal Liver: No focal lesion identified. Within normal limits in parenchymal echogenicity. Portal vein is patent on color Doppler imaging with normal direction of blood flow towards the liver. Other: None. IMPRESSION: Abnormal gallbladder with cholelithiasis, sludge, and pericholecystic edema. Murphy's sign is negative. Electronically Signed   By: Burman Nieves M.D.   On: 02/12/2022 23:10   DG Chest Port 1 View  Result Date: 02/12/2022 CLINICAL DATA:  Question of sepsis. EXAM: PORTABLE CHEST 1 VIEW COMPARISON:  12/27/2016 FINDINGS: Shallow inspiration with atelectasis or fibrosis in the lung bases. Heart size is normal for technique. No pleural effusions. No pneumothorax. No focal consolidation. Gas-filled colon in the upper abdomen suggests ileus. IMPRESSION: Shallow inspiration with linear atelectasis or fibrosis in the lung bases. Electronically Signed   By: Burman Nieves M.D.   On: 02/12/2022 21:28   CT Renal Stone Study  Result Date: 02/12/2022 CLINICAL DATA:  LEFT flank and back pain question kidney stone, increased urinary frequency for 2 weeks, nauseous, fever H, history of kidney stones EXAM: CT ABDOMEN AND PELVIS WITHOUT CONTRAST TECHNIQUE: Multidetector CT imaging of the abdomen and pelvis was performed following the standard protocol without IV  contrast. RADIATION DOSE REDUCTION: This exam was performed according to the departmental dose-optimization program which includes automated exposure control, adjustment of the mA and/or kV according to patient size and/or use of iterative reconstruction technique. COMPARISON:  04/07/2010 FINDINGS: Lower chest: RIGHT basilar atelectasis Hepatobiliary: Abnormal enlarged gallbladder containing calculus, wall thickening, and pericholecystic infiltration highly suspicious for acute cholecystitis. Cannot exclude pericholecystic fluid collection. Liver unremarkable. No definite biliary dilatation. Pancreas: Normal appearance Spleen: Normal appearance Adrenals/Urinary Tract: Adrenal glands, kidneys, and ureters normal appearance. Bladder decompressed. Stomach/Bowel: Stomach mildly distended by gas. Large and small bowel loops unremarkable. Normal appendix. Vascular/Lymphatic: Atherosclerotic calcifications aorta without aneurysm. No adenopathy. Reproductive: Retroverted uterus.  No adnexal masses. Other: No free air or free fluid.  No hernia. Musculoskeletal: Prior lumbar fusion L4-L5. Degenerative disc disease changes L5-S1. IMPRESSION: Abnormally enlarged gallbladder containing calculus, wall thickening, and pericholecystic infiltration highly suspicious for acute cholecystitis. Cannot exclude a discrete pericholecystic fluid collection; recommend correlation with ultrasound. Aortic Atherosclerosis (ICD10-I70.0). Electronically Signed   By: Ulyses Southward M.D.   On: 02/12/2022 17:46    Anti-infectives: Anti-infectives (From admission, onward)    Start     Dose/Rate Route Frequency Ordered Stop   02/13/22 0200  piperacillin-tazobactam (ZOSYN) IVPB 3.375 g        3.375 g 12.5 mL/hr over 240 Minutes Intravenous Every 8 hours 02/13/22 0143     02/12/22 2115  cefTRIAXone (ROCEPHIN) 2 g in sodium chloride 0.9 % 100 mL IVPB  2 g 200 mL/hr over 30 Minutes Intravenous  Once 02/12/22 2112 02/12/22 2256   02/12/22  2115  metroNIDAZOLE (FLAGYL) IVPB 500 mg        500 mg 100 mL/hr over 60 Minutes Intravenous  Once 02/12/22 2112 02/12/22 2348        Assessment/Plan  Acute cholecystitis - s/p IR perc chole 6/29, cultures pending - Labs pending this morning. Advance to full liquids and add bowel regimen. Continue mobilizing. Home oxy and flexeril ordered. Continue drain, IV antibiotics and follow cultures.   ID - rocephin/flagyl 6/28, zosyn 6/29>> FEN - IVF, FLD VTE - SCDs, lovenox Foley - none  Depression HLD Chronic pain on 15mg  oxy at home   I reviewed Consultant interventional radiology notes, last 24 h vitals and pain scores, last 48 h intake and output, last 24 h labs and trends, and last 24 h imaging results.    LOS: 1 day    , [facility] 02/14/2022, 8:58 AM Please see Amion for pager number during day hours 7:00am-4:30pm

## 2022-02-14 NOTE — Plan of Care (Signed)
  Problem: Nutrition: Goal: Adequate nutrition will be maintained Outcome: Progressing   Problem: Pain Managment: Goal: General experience of comfort will improve Outcome: Progressing   Problem: Safety: Goal: Ability to remain free from injury will improve Outcome: Progressing   

## 2022-02-15 LAB — BASIC METABOLIC PANEL
Anion gap: 5 (ref 5–15)
BUN: 7 mg/dL (ref 6–20)
CO2: 29 mmol/L (ref 22–32)
Calcium: 8.1 mg/dL — ABNORMAL LOW (ref 8.9–10.3)
Chloride: 105 mmol/L (ref 98–111)
Creatinine, Ser: 0.81 mg/dL (ref 0.44–1.00)
GFR, Estimated: >60 mL/min (ref >60)
Glucose, Bld: 90 mg/dL (ref 70–99)
Potassium: 3.9 mmol/L (ref 3.5–5.1)
Sodium: 139 mmol/L (ref 135–145)

## 2022-02-15 MED ORDER — POLYETHYLENE GLYCOL 3350 17 G PO PACK
17.0000 g | PACK | Freq: Two times a day (BID) | ORAL | Status: DC
  Administered 2022-02-15 – 2022-02-17 (×5): 17 g via ORAL
  Filled 2022-02-15 (×4): qty 1

## 2022-02-15 NOTE — Progress Notes (Signed)
Subjective/Chief Complaint: Wants to eat no BM pain in epigastrium the same    Objective: Vital signs in last 24 hours: Temp:  [98 F (36.7 C)-98.8 F (37.1 C)] 98.8 F (37.1 C) (07/01 0722) Pulse Rate:  [63-68] 68 (07/01 0722) Resp:  [16-19] 16 (07/01 0722) BP: (138-171)/(83-97) 138/97 (07/01 0722) SpO2:  [93 %-97 %] 93 % (07/01 0722) Last BM Date : 02/12/22  Intake/Output from previous day: 06/30 0701 - 07/01 0700 In: 924.9 [P.O.:480; I.V.:325.4; IV Piggyback:119.5] Out: 1025 [Drains:1025] Intake/Output this shift: Total I/O In: -  Out: 100 [Drains:100]   Gen:  Alert, NAD, pleasant Abd: soft, mild distension, focally tender around RUQ drain, drain with bilious fluid in bag Lab Results:  Recent Labs    02/12/22 1740 02/14/22 0920  WBC 25.4* 7.5  HGB 12.0 11.1*  HCT 36.8 33.2*  PLT 265 218   BMET Recent Labs    02/14/22 0920 02/15/22 0218  NA 138 139  K 3.0* 3.9  CL 102 105  CO2 27 29  GLUCOSE 101* 90  BUN 7 7  CREATININE 0.83 0.81  CALCIUM 8.1* 8.1*   PT/INR Recent Labs    02/12/22 2146  LABPROT 14.7  INR 1.2   ABG No results for input(s): "PHART", "HCO3" in the last 72 hours.  Invalid input(s): "PCO2", "PO2"  Studies/Results: IR Perc Cholecystostomy  Result Date: 02/13/2022 INDICATION: 61 year old female referred for cholecystostomy EXAM: CHOLECYSTOSTOMY MEDICATIONS: None ANESTHESIA/SEDATION: Moderate (conscious) sedation was employed during this procedure. A total of Versed 1.0 mg and Fentanyl 50 mcg was administered intravenously. Moderate Sedation Time: 10 minutes. The patient's level of consciousness and vital signs were monitored continuously by radiology nursing throughout the procedure under my direct supervision. FLUOROSCOPY TIME:  Fluoroscopy Time: 18 minutes 0 seconds (2 mGy). COMPLICATIONS: None PROCEDURE: Informed written consent was obtained from the patient and the patient's family after a thorough discussion of the procedural  risks, benefits and alternatives. All questions were addressed. Maximal Sterile Barrier Technique was utilized including caps, mask, sterile gowns, sterile gloves, sterile drape, hand hygiene and skin antiseptic. A timeout was performed prior to the initiation of the procedure. Ultrasound survey of the right upper quadrant was performed for planning purposes. There appears to be some pericholecystic fluid potentially abscess. Once the patient is prepped and draped in the usual sterile fashion, the skin and subcutaneous tissues overlying the gallbladder were generously infiltrated 1% lidocaine for local anesthesia. A coaxial needle was advanced under ultrasound guidance through the skin subcutaneous tissues and a small segment of liver into the gallbladder lumen. With removal of the stylet, spontaneous dark bile drainage occurred. Using modified Seldinger technique, a 10 French drain was placed into the gallbladder fossa, with aspiration of the sample for the lab. Contrast injection confirmed position of the tube within the gallbladder lumen. Drainage catheter was attached to gravity drain with a suture retention placed. Patient tolerated the procedure well and remained hemodynamically stable throughout. No complications were encountered and no significant blood loss encountered. IMPRESSION: Status post percutaneous cholecystostomy Signed, Yvone Neu. Miachel Roux, RPVI [facility] Radiology Electronically Signed   By: Gilmer Mor D.O.   On: 02/13/2022 12:05    Anti-infectives: Anti-infectives (From admission, onward)    Start     Dose/Rate Route Frequency Ordered Stop   02/13/22 0200  piperacillin-tazobactam (ZOSYN) IVPB 3.375 g        3.375 g 12.5 mL/hr over 240 Minutes Intravenous Every 8 hours 02/13/22 0143  02/12/22 2115  cefTRIAXone (ROCEPHIN) 2 g in sodium chloride 0.9 % 100 mL IVPB        2 g 200 mL/hr over 30 Minutes Intravenous  Once 02/12/22  2112 02/12/22 2256   02/12/22 2115  metroNIDAZOLE (FLAGYL) IVPB 500 mg        500 mg 100 mL/hr over 60 Minutes Intravenous  Once 02/12/22 2112 02/12/22 2348       Assessment/Plan: Acute cholecystitis - s/p IR perc chole 6/29, cultures pending - Labs pending this morning. Advance to soft  and increase bowel regimen. Continue mobilizing. Home oxy and flexeril ordered. Continue drain, IV antibiotics and follow cultures.    ID - rocephin/flagyl 6/28, zosyn 6/29>> FEN - IVF, FLD VTE - SCDs, lovenox Foley - none   Depression HLD Chronic pain on 15mg  oxy at home    I reviewed Consultant interventional radiology notes, last 24 h vitals and pain scores, last 48 h intake and output, last 24 h labs and trends, and last 24 h imaging results.  total time 20 minutes    LOS: 2 days    MD  02/15/2022

## 2022-02-15 NOTE — Progress Notes (Signed)
Referring Physician(s): Violeta Gelinas   Supervising Physician: Malachy Moan  [facility]  Chief Complaint:  Acute cholecystitis s/p cholecystomy tube placement on 6.29.23 by Dr. Loreta Ave  Subjective:   Patient laying in bed, not in acute distress. Still reports soreness around the drain. Denies abdominal pain, nausea, vomiting, fever, chills.  Allergies: Bee venom, Aspirin, Lyrica [pregabalin], and Morphine and related  Medications: Prior to Admission medications   Medication Sig Start Date End Date Taking? Authorizing Provider  betamethasone dipropionate (DIPROLENE) 0.05 % ointment Apply 1 Application topically in the morning and at bedtime. Both legs 09/26/21  Yes [provider]  cetirizine (ZYRTEC) 10 MG tablet Take 10 mg by mouth daily.   Yes [provider]  cyclobenzaprine (FLEXERIL) 10 MG tablet Take 0.5-1 tablets (5-10 mg total) by mouth 2 (two) times daily as needed for muscle spasms. 03/22/18  Yes Harris, Abigail, PA-C  DULoxetine (CYMBALTA) 60 MG capsule Take 60 mg by mouth daily. 01/25/22  Yes [provider]  fluticasone (FLONASE) 50 MCG/ACT nasal spray Place 1-2 sprays into both nostrils daily. 06/03/20  Yes Wieters, Hallie C, PA-C  gabapentin (NEURONTIN) 600 MG tablet Take 600 mg by mouth 2 (two) times daily. 01/25/22  Yes [provider]  naloxone (NARCAN) nasal spray 4 mg/0.1 mL Place 1 spray into the nose once as needed (opiod overdose). 01/25/22  Yes [provider]  oxyCODONE (ROXICODONE) 15 MG immediate release tablet Take 15 mg by mouth 5 (five) times daily as needed for severe pain. 01/25/22  Yes [provider]  pravastatin (PRAVACHOL) 10 MG tablet Take 10 mg by mouth daily. 12/26/21  Yes [provider]     Vital Signs: BP (!) 138/97 (BP Location: Right Arm)   Pulse 68   Temp 98.8 F (37.1 C) (Oral)   Resp 16   Ht 5' 8.5" (1.74 m)   Wt 214 lb 1.1 oz (97.1 kg)   LMP  04/26/2012   SpO2 93%   BMI 32.08 kg/m   Physical Exam Vitals and nursing note reviewed.  Constitutional:      Appearance: She is well-developed.  HENT:     Head: Normocephalic and atraumatic.  Eyes:     Conjunctiva/sclera: Conjunctivae normal.  Pulmonary:     Effort: Pulmonary effort is normal.  Abdominal:     Comments: Positive RUQ drain to gravity bag. Site is unremarkable with no erythema, edema, tenderness, bleeding or drainage noted at exit site. Suture in place. Dressing is clean dry and intact. 50 ml of  green colored fluid noted in gravity device. Drain is able to be flushed easily.    Musculoskeletal:        General: Normal range of motion.     Cervical back: Normal range of motion.  Skin:    General: Skin is warm.  Neurological:     Mental Status: She is alert and oriented to person, place, and time.     Imaging: IR Perc Cholecystostomy  Result Date: 02/13/2022 INDICATION: 61 year old female referred for cholecystostomy EXAM: CHOLECYSTOSTOMY MEDICATIONS: None ANESTHESIA/SEDATION: Moderate (conscious) sedation was employed during this procedure. A total of Versed 1.0 mg and Fentanyl 50 mcg was administered intravenously. Moderate Sedation Time: 10 minutes. The patient's level of consciousness and vital signs were monitored continuously by radiology nursing throughout the procedure under my direct supervision. FLUOROSCOPY TIME:  Fluoroscopy Time: 18 minutes 0 seconds (2 mGy). COMPLICATIONS: None PROCEDURE: Informed written consent was obtained from the patient and  the patient's family after a thorough discussion of the procedural risks, benefits and alternatives. All questions were addressed. Maximal Sterile Barrier Technique was utilized including caps, mask, sterile gowns, sterile gloves, sterile drape, hand hygiene and skin antiseptic. A timeout was performed prior to the initiation of the procedure. Ultrasound survey of the right upper quadrant was performed for planning  purposes. There appears to be some pericholecystic fluid potentially abscess. Once the patient is prepped and draped in the usual sterile fashion, the skin and subcutaneous tissues overlying the gallbladder were generously infiltrated 1% lidocaine for local anesthesia. A coaxial needle was advanced under ultrasound guidance through the skin subcutaneous tissues and a small segment of liver into the gallbladder lumen. With removal of the stylet, spontaneous dark bile drainage occurred. Using modified Seldinger technique, a 10 French drain was placed into the gallbladder fossa, with aspiration of the sample for the lab. Contrast injection confirmed position of the tube within the gallbladder lumen. Drainage catheter was attached to gravity drain with a suture retention placed. Patient tolerated the procedure well and remained hemodynamically stable throughout. No complications were encountered and no significant blood loss encountered. IMPRESSION: Status post percutaneous cholecystostomy Signed, Yvone Neu. Miachel Roux, RPVI [facility] Radiology Electronically Signed   By: Gilmer Mor D.O.   On: 02/13/2022 12:05   US Abdomen Limited  Result Date: 02/12/2022 CLINICAL DATA:  Right upper quadrant pain EXAM: ULTRASOUND ABDOMEN LIMITED RIGHT UPPER QUADRANT COMPARISON:  CT 02/12/2022 FINDINGS: Gallbladder: Multiple large gallstones. Gallbladder sludge with gallbladder wall septation and pericholecystic fluid. Murphy's sign is negative. Common bile duct: Diameter: 5 mm, normal Liver: No focal lesion identified. Within normal limits in parenchymal echogenicity. Portal vein is patent on color Doppler imaging with normal direction of blood flow towards the liver. Other: None. IMPRESSION: Abnormal gallbladder with cholelithiasis, sludge, and pericholecystic edema. Murphy's sign is negative. Electronically Signed   By: Burman Nieves M.D.   On: 02/12/2022 23:10   DG  Chest Port 1 View  Result Date: 02/12/2022 CLINICAL DATA:  Question of sepsis. EXAM: PORTABLE CHEST 1 VIEW COMPARISON:  12/27/2016 FINDINGS: Shallow inspiration with atelectasis or fibrosis in the lung bases. Heart size is normal for technique. No pleural effusions. No pneumothorax. No focal consolidation. Gas-filled colon in the upper abdomen suggests ileus. IMPRESSION: Shallow inspiration with linear atelectasis or fibrosis in the lung bases. Electronically Signed   By: Burman Nieves M.D.   On: 02/12/2022 21:28   CT Renal Stone Study  Result Date: 02/12/2022 CLINICAL DATA:  LEFT flank and back pain question kidney stone, increased urinary frequency for 2 weeks, nauseous, fever H, history of kidney stones EXAM: CT ABDOMEN AND PELVIS WITHOUT CONTRAST TECHNIQUE: Multidetector CT imaging of the abdomen and pelvis was performed following the standard protocol without IV contrast. RADIATION DOSE REDUCTION: This exam was performed according to the departmental dose-optimization program which includes automated exposure control, adjustment of the mA and/or kV according to patient size and/or use of iterative reconstruction technique. COMPARISON:  04/07/2010 FINDINGS: Lower chest: RIGHT basilar atelectasis Hepatobiliary: Abnormal enlarged gallbladder containing calculus, wall thickening, and pericholecystic infiltration highly suspicious for acute cholecystitis. Cannot exclude pericholecystic fluid collection. Liver unremarkable. No definite biliary dilatation. Pancreas: Normal appearance Spleen: Normal appearance Adrenals/Urinary Tract: Adrenal glands, kidneys, and ureters normal appearance. Bladder decompressed. Stomach/Bowel: Stomach mildly distended by gas. Large and small bowel loops unremarkable. Normal appendix. Vascular/Lymphatic: Atherosclerotic calcifications aorta without aneurysm. No adenopathy. Reproductive: Retroverted uterus.  No adnexal masses. Other:  No free air or free fluid.  No hernia.  Musculoskeletal: Prior lumbar fusion L4-L5. Degenerative disc disease changes L5-S1. IMPRESSION: Abnormally enlarged gallbladder containing calculus, wall thickening, and pericholecystic infiltration highly suspicious for acute cholecystitis. Cannot exclude a discrete pericholecystic fluid collection; recommend correlation with ultrasound. Aortic Atherosclerosis (ICD10-I70.0). Electronically Signed   By: Ulyses Southward M.D.   On: 02/12/2022 17:46    Labs:  CBC: Recent Labs    02/12/22 1740 02/14/22 0920  WBC 25.4* 7.5  HGB 12.0 11.1*  HCT 36.8 33.2*  PLT 265 218     COAGS: Recent Labs    02/12/22 2146  INR 1.2     BMP: Recent Labs    02/12/22 1740 02/13/22 0312 02/14/22 0920 02/15/22 0218  NA 137 141 138 139  K 3.1* 3.0* 3.0* 3.9  CL 104 105 102 105  CO2 22 25 27 29   GLUCOSE 112* 100* 101* 90  BUN 20 14 7 7   CALCIUM 8.3* 8.1* 8.1* 8.1*  CREATININE 1.14* 0.87 0.83 0.81  GFRNONAA 55* >60 >60 >60     LIVER FUNCTION TESTS: Recent Labs    02/12/22 1740 02/13/22 0312 02/14/22 0920  BILITOT 0.7 0.7 0.5  AST 19 15 18   ALT 20 16 20   ALKPHOS 88 63 65  PROT 7.3 5.8* 6.2*  ALBUMIN 3.6 2.7* 2.9*     Assessment and Plan:  61 y.o. female inpatient. History of RUQ pain. Presented to the ED at [facility] on 6.28.23 with RUQ abdominal pain, nausea, fever and transient numbness. Found to have acute cholecystitis. IR placed a chole tube on 6.29.23.   Overnight OP  1,025 mL - bile colored  No CBC, no CMP today. BMP stable VSS CX showed few gram - rods do far  Drain Location: RUQ Size: Fr size: 12 Fr Date of placement: 6/29  Currently to: Drain collection device: gravity 24 hour output:  Output by Drain (mL) 02/13/22 0701 - 02/13/22 1900 02/13/22 1901 - 02/14/22 0700 02/14/22 0701 - 02/14/22 1900 02/14/22 1901 - 02/15/22 0700 02/15/22 0701 - 02/15/22 0900  Closed System Drain Right RUQ 10.2 Fr. 150 350 525 500 100    Interval imaging/drain manipulation:  None   Current  examination: Flushes/aspirates easily.  Insertion site unremarkable. Suture and stat lock in place. Dressed appropriately.   Plan: Continue TID flushes with 5 cc NS. Record output Q shift. Dressing changes QD or PRN if soiled.  Call IR APP or on call IR MD if difficulty flushing or sudden change in drain output.  Repeat imaging/possible drain injection once output < 10 mL/QD (excluding flush material). Consideration for drain removal if output is < 10 mL/QD (excluding flush material), pending discussion with the providing surgical service.  Discharge planning: Please contact IR APP or on call IR MD prior to patient d/c to ensure appropriate follow up plans are in place. Typically patient will follow up with IR clinic 10-14 days post d/c for repeat imaging/possible drain injection. IR scheduler will contact patient with date/time of appointment. Patient will need to flush drain QD with 5 cc NS, record output QD, dressing changes every 2-3 days or earlier if soiled.   IR will continue to follow - please call with questions or concerns.    Electronically Signed: 02/16/22, PA-C 02/15/2022, 8:59 AM   I spent a total of 15 Minutes at the patient's bedside AND on the patient's hospital floor or unit, greater than 50% of which was counseling/coordinating care for chole tube placement.

## 2022-02-16 ENCOUNTER — Other Ambulatory Visit: Payer: Self-pay | Admitting: Student

## 2022-02-16 MED ORDER — SODIUM CHLORIDE FLUSH 0.9 % IV SOLN
5.0000 mL | Freq: Every day | INTRAVENOUS | 1 refills | Status: AC
  Filled 2022-02-16: qty 200, 40d supply, fill #0

## 2022-02-16 NOTE — Progress Notes (Signed)
   Subjective/Chief Complaint: Patient feels and looks better this morning.  She is tolerating her diet and her pain is improving.   Objective: Vital signs in last 24 hours: Temp:  [97.4 F (36.3 C)-98.7 F (37.1 C)] 97.4 F (36.3 C) (07/02 0808) Pulse Rate:  [63-70] 70 (07/02 0808) Resp:  [16-18] 16 (07/02 0808) BP: (137-185)/(83-91) 149/85 (07/02 0808) SpO2:  [96 %-100 %] 96 % (07/02 0808) Last BM Date : 02/12/22  Intake/Output from previous day: 07/01 0701 - 07/02 0700 In: 10  Out: 275 [Drains:275] Intake/Output this shift: No intake/output data recorded.   Gen:  Alert, NAD, pleasant Abd: soft, mild distension, focally tender around RUQ drain, drain with bilious fluid in bag Lab Results:  Recent Labs    02/14/22 0920  WBC 7.5  HGB 11.1*  HCT 33.2*  PLT 218   BMET Recent Labs    02/14/22 0920 02/15/22 0218  NA 138 139  K 3.0* 3.9  CL 102 105  CO2 27 29  GLUCOSE 101* 90  BUN 7 7  CREATININE 0.83 0.81  CALCIUM 8.1* 8.1*   PT/INR No results for input(s): "LABPROT", "INR" in the last 72 hours. ABG No results for input(s): "PHART", "HCO3" in the last 72 hours.  Invalid input(s): "PCO2", "PO2"  Studies/Results: No results found.  Anti-infectives: Anti-infectives (From admission, onward)    Start     Dose/Rate Route Frequency Ordered Stop   02/13/22 0200  piperacillin-tazobactam (ZOSYN) IVPB 3.375 g        3.375 g 12.5 mL/hr over 240 Minutes Intravenous Every 8 hours 02/13/22 0143     02/12/22 2115  cefTRIAXone (ROCEPHIN) 2 g in sodium chloride 0.9 % 100 mL IVPB        2 g 200 mL/hr over 30 Minutes Intravenous  Once 02/12/22 2112 02/12/22 2256   02/12/22 2115  metroNIDAZOLE (FLAGYL) IVPB 500 mg        500 mg 100 mL/hr over 60 Minutes Intravenous  Once 02/12/22 2112 02/12/22 2348       Assessment/Plan: Acute cholecystitis - s/p IR perc chole 6/29, cultures pending - Labs pending this morning. Advance to soft  and increase bowel regimen.  Continue mobilizing. Home oxy and flexeril ordered. Continue drain, IV antibiotics and follow cultures.   Feeling better this AM  Plan D/C Monday  ID - rocephin/flagyl 6/28, zosyn 6/29>> FEN - IVF, FLD VTE - SCDs, lovenox Foley - none   Depression HLD Chronic pain on 15mg  oxy at home    I reviewed Consultant interventional radiology notes, last 24 h vitals and pain scores, last 48 h intake and output, last 24 h labs and trends, and last 24 h imaging results.   total time 20 minutes       MD    LOS: 3 days     02/16/2022

## 2022-02-17 ENCOUNTER — Other Ambulatory Visit (HOSPITAL_COMMUNITY): Payer: Self-pay

## 2022-02-17 LAB — CULTURE, BLOOD (ROUTINE X 2)
Culture: NO GROWTH
Culture: NO GROWTH
Special Requests: ADEQUATE
Special Requests: ADEQUATE

## 2022-02-17 MED ORDER — GABAPENTIN 400 MG PO CAPS
800.0000 mg | ORAL_CAPSULE | Freq: Three times a day (TID) | ORAL | 0 refills | Status: AC
  Filled 2022-02-17: qty 30, 5d supply, fill #0

## 2022-02-17 MED ORDER — AMOXICILLIN-POT CLAVULANATE 875-125 MG PO TABS
1.0000 | ORAL_TABLET | Freq: Two times a day (BID) | ORAL | 0 refills | Status: AC
  Filled 2022-02-17: qty 20, 10d supply, fill #0

## 2022-02-17 MED ORDER — ACETAMINOPHEN 500 MG PO TABS: 1000.0000 mg | ORAL_TABLET | Freq: Four times a day (QID) | ORAL | Status: AC | PRN

## 2022-02-17 MED ORDER — SODIUM CHLORIDE 0.9% FLUSH: 5.0000 mL | Freq: Three times a day (TID) | INTRAVENOUS | Status: DC

## 2022-02-17 NOTE — Discharge Summary (Signed)
[facility] Discharge Summary   [name] MRN: [id number] DOB/AGE: Jan 13, 1961 61 y.o.  Admit date: 02/12/2022 Discharge date: 02/17/2022  Admitting Diagnosis: Acute cholecystitis   Discharge Diagnosis Acute cholecystitis s/p percutaneous cholecystostomy   Consultants IR  Imaging: No results found.  Procedures Dr. Corrie Mckusick (02/13/22) - Percutaneous cholecystostomy   Hospital Course:  Patient is a 61 year old female who presented to the ED with abdominal pain.  Workup showed acute cholecystitis with significant inflammation.  Patient was admitted and underwent procedure listed above.  Tolerated procedure well and was transferred to the floor.  Diet was advanced as tolerated.  On 02/17/22, the patient was voiding well, tolerating diet, ambulating well, pain well controlled, vital signs stable, drain functioning well and felt stable for discharge home.  Patient will follow up in our office in 2-3 weeks and knows to call with questions or concerns. She will also follow up with IR drain clinic.    Physical Exam: General:  Alert, NAD, pleasant, comfortable Abd:  Soft, ND, mild tenderness, drain with bilious fluid   I or a member of my team have reviewed this patient in the Controlled Substance Database.   Allergies as of 02/17/2022       Reactions   Bee Venom Anaphylaxis   Aspirin Itching   Lyrica [pregabalin] Rash   Pt. States that she breaks out in hives when she takes Lyrica.    Morphine And Related Itching        Medication List     STOP taking these medications    gabapentin 600 MG tablet Commonly known as: NEURONTIN Replaced by: gabapentin 400 MG capsule       TAKE these medications    acetaminophen 500 MG tablet Commonly known as: TYLENOL Take 2 tablets (1,000 mg total) by mouth every 6 (six) hours as needed for mild pain or fever.   amoxicillin-clavulanate 875-125 MG tablet Commonly known as: AUGMENTIN Take 1 tablet by  mouth 2 (two) times daily for 10 days.   betamethasone dipropionate 0.05 % ointment Commonly known as: DIPROLENE Apply 1 Application topically in the morning and at bedtime. Both legs   cetirizine 10 MG tablet Commonly known as: ZYRTEC Take 10 mg by mouth daily.   cyclobenzaprine 10 MG tablet Commonly known as: FLEXERIL Take 0.5-1 tablets (5-10 mg total) by mouth 2 (two) times daily as needed for muscle spasms.   DULoxetine 60 MG capsule Commonly known as: CYMBALTA Take 60 mg by mouth daily.   fluticasone 50 MCG/ACT nasal spray Commonly known as: FLONASE Place 1-2 sprays into both nostrils daily.   gabapentin 400 MG capsule Commonly known as: NEURONTIN Take 2 capsules (800 mg total) by mouth 3 (three) times daily. Replaces: gabapentin 600 MG tablet   naloxone 4 MG/0.1ML Liqd nasal spray kit Commonly known as: NARCAN Place 1 spray into the nose once as needed (opiod overdose).   oxyCODONE 15 MG immediate release tablet Commonly known as: ROXICODONE Take 15 mg by mouth 5 (five) times daily as needed for severe pain.   pravastatin 10 MG tablet Commonly known as: PRAVACHOL Take 10 mg by mouth daily.   sodium chloride flush 0.9 % Soln injection Use 5 mLs by Intracatheter route daily.          Follow-up Information     Georganna Skeans, MD. Go on 03/12/2022.   Specialty: General Surgery Why: at 9:00 AM. Please arrive 30 minutes prior to your appointment to check in and fill  out paperwork. Bring photo ID and insurance information. Contact information: [address] [place] [address] [phone]         Corrie Mckusick, DO Follow up.   Specialties: Interventional Radiology, Radiology Why: Their office should contact you with an appointment Contact information: [address] [address] [phone]                 Signed: Norm Parcel , [facility] Surgery 02/17/2022, 2:42 PM Please see Amion for  pager number during day hours 7:00am-4:30pm

## 2022-02-17 NOTE — Progress Notes (Signed)
Nsg Discharge Note  Admit Date:  02/12/2022 Discharge date: 02/17/2022   Martha Buck to be D/C'd Home per MD order.  AVS completed.   Removed IV-CDI. Reviewed d/c paperwork with patient. Volunteer wheeled stable patient and belongings to ED entrance. Patient/caregiver able to verbalize understanding.  Discharge Medication: Allergies as of 02/17/2022       Reactions   Bee Venom Anaphylaxis   Aspirin Itching   Lyrica [pregabalin] Rash   Pt. States that she breaks out in hives when she takes Lyrica.    Morphine And Related Itching        Medication List     STOP taking these medications    gabapentin 600 MG tablet Commonly known as: NEURONTIN Replaced by: gabapentin 400 MG capsule       TAKE these medications    acetaminophen 500 MG tablet Commonly known as: TYLENOL Take 2 tablets (1,000 mg total) by mouth every 6 (six) hours as needed for mild pain or fever.   amoxicillin-clavulanate 875-125 MG tablet Commonly known as: AUGMENTIN Take 1 tablet by mouth 2 (two) times daily for 10 days.   betamethasone dipropionate 0.05 % ointment Commonly known as: DIPROLENE Apply 1 Application topically in the morning and at bedtime. Both legs   cetirizine 10 MG tablet Commonly known as: ZYRTEC Take 10 mg by mouth daily.   cyclobenzaprine 10 MG tablet Commonly known as: FLEXERIL Take 0.5-1 tablets (5-10 mg total) by mouth 2 (two) times daily as needed for muscle spasms.   DULoxetine 60 MG capsule Commonly known as: CYMBALTA Take 60 mg by mouth daily.   fluticasone 50 MCG/ACT nasal spray Commonly known as: FLONASE Place 1-2 sprays into both nostrils daily.   gabapentin 400 MG capsule Commonly known as: NEURONTIN Take 2 capsules (800 mg total) by mouth 3 (three) times daily. Replaces: gabapentin 600 MG tablet   naloxone 4 MG/0.1ML Liqd nasal spray kit Commonly known as: NARCAN Place 1 spray into the nose once as needed (opiod overdose).   oxyCODONE 15 MG  immediate release tablet Commonly known as: ROXICODONE Take 15 mg by mouth 5 (five) times daily as needed for severe pain.   pravastatin 10 MG tablet Commonly known as: PRAVACHOL Take 10 mg by mouth daily.   sodium chloride flush 0.9 % Soln injection Use 5 mLs by Intracatheter route daily.        Discharge Assessment: Vitals:   02/17/22 0559 02/17/22 0802  BP: (!) 169/92 (!) 146/72  Pulse: 62 71  Resp: 18 18  Temp: 97.9 F (36.6 C) 98 F (36.7 C)  SpO2: 96% 100%   Skin clean, dry and intact without evidence of skin break down, no evidence of skin tears noted. IV catheter discontinued intact. Site without signs and symptoms of complications - no redness or edema noted at insertion site, patient denies c/o pain - only slight tenderness at site.  Dressing with slight pressure applied.  D/c Instructions-Education: Discharge instructions given to patient/family with verbalized understanding. D/c education completed with patient/family including follow up instructions, medication list, d/c activities limitations if indicated, with other d/c instructions as indicated by MD - patient able to verbalize understanding, all questions fully answered. Patient instructed to return to ED, call 911, or call MD for any changes in condition.  Patient escorted via [place], and D/C home via private auto.  Santa Lighter, RN 02/17/2022 1:16 PM

## 2022-02-17 NOTE — Progress Notes (Signed)
Referring Physician(s): Violeta Gelinas   Supervising Physician: Malachy Moan  Patient Status:  [facility] - In-pt  Chief Complaint:  Acute cholecystitis s/p cholecystomy tube placement on 6.29.23 by Dr. Loreta Ave  Subjective:   Patient laying in bed, not in acute distress. RN at bedside.  Still reports soreness around the drain, which is stable. Denies abdominal pain, nausea, vomiting, fever, chills. Patient unsure when she will be discharged.   Discussed drain care and follow up  IR Drain Care Instruction     Always wash your hands before manipulating drain Flush the drain with 5 cc normal saline daily - Please pick up saline flushes at [facility] outpatient pharmacy  Record output daily (subtract 5 cc that was used to flush the drain from the total daily output)  Dressing change as needed and at least once a week You will receive a call from [facility] schedulers. If you have any questions about your follow up appointment, please call [phone] If you have concerns about your drain, please call [facility] at [phone]    Allergies: Bee venom, Aspirin, Lyrica [pregabalin], and Morphine and related  Medications: Prior to Admission medications   Medication Sig Start Date End Date Taking? Authorizing Provider  betamethasone dipropionate (DIPROLENE) 0.05 % ointment Apply 1 Application topically in the morning and at bedtime. Both legs 09/26/21  Yes [provider]  cetirizine (ZYRTEC) 10 MG tablet Take 10 mg by mouth daily.   Yes [provider]  cyclobenzaprine (FLEXERIL) 10 MG tablet Take 0.5-1 tablets (5-10 mg total) by mouth 2 (two) times daily as needed for muscle spasms. 03/22/18  Yes Harris, Abigail, PA-C  DULoxetine (CYMBALTA) 60 MG capsule Take 60 mg by mouth daily. 01/25/22  Yes [provider]  fluticasone (FLONASE) 50 MCG/ACT nasal spray Place 1-2 sprays into both nostrils daily. 06/03/20  Yes Wieters,  Hallie C, PA-C  gabapentin (NEURONTIN) 600 MG tablet Take 600 mg by mouth 2 (two) times daily. 01/25/22  Yes [provider]  naloxone (NARCAN) nasal spray 4 mg/0.1 mL Place 1 spray into the nose once as needed (opiod overdose). 01/25/22  Yes [provider]  oxyCODONE (ROXICODONE) 15 MG immediate release tablet Take 15 mg by mouth 5 (five) times daily as needed for severe pain. 01/25/22  Yes [provider]  pravastatin (PRAVACHOL) 10 MG tablet Take 10 mg by mouth daily. 12/26/21  Yes [provider]     Vital Signs: BP (!) 146/72 (BP Location: Left Arm)   Pulse 71   Temp 98 F (36.7 C) (Oral)   Resp 18   Ht 5' 8.5" (1.74 m)   Wt 214 lb 1.1 oz (97.1 kg)   LMP 04/26/2012   SpO2 100%   BMI 32.08 kg/m   Physical Exam Vitals and nursing note reviewed.  Constitutional:      Appearance: She is well-developed.  HENT:     Head: Normocephalic and atraumatic.  Eyes:     Conjunctiva/sclera: Conjunctivae normal.  Pulmonary:     Effort: Pulmonary effort is normal.  Abdominal:     General: Abdomen is flat.     Palpations: Abdomen is soft.     Comments: Positive RUQ drain to gravity bag. Site is unremarkable with no erythema, edema, tenderness, bleeding or drainage noted at exit site. Suture in place. Dressing is clean dry and intact. 20 ml of  green colored fluid noted in gravity device. Drain is able to be flushed easily.  Musculoskeletal:        General: Normal range of motion.     Cervical back: Normal range of motion.  Skin:    General: Skin is warm.  Neurological:     Mental Status: She is alert and oriented to person, place, and time.  Psychiatric:        Behavior: Behavior normal.     Imaging: IR Perc Cholecystostomy  Result Date: 02/13/2022 INDICATION: 61 year old female referred for cholecystostomy EXAM: CHOLECYSTOSTOMY MEDICATIONS: None ANESTHESIA/SEDATION: Moderate (conscious) sedation was employed during this procedure. A total of  Versed 1.0 mg and Fentanyl 50 mcg was administered intravenously. Moderate Sedation Time: 10 minutes. The patient's level of consciousness and vital signs were monitored continuously by radiology nursing throughout the procedure under my direct supervision. FLUOROSCOPY TIME:  Fluoroscopy Time: 18 minutes 0 seconds (2 mGy). COMPLICATIONS: None PROCEDURE: Informed written consent was obtained from the patient and the patient's family after a thorough discussion of the procedural risks, benefits and alternatives. All questions were addressed. Maximal Sterile Barrier Technique was utilized including caps, mask, sterile gowns, sterile gloves, sterile drape, hand hygiene and skin antiseptic. A timeout was performed prior to the initiation of the procedure. Ultrasound survey of the right upper quadrant was performed for planning purposes. There appears to be some pericholecystic fluid potentially abscess. Once the patient is prepped and draped in the usual sterile fashion, the skin and subcutaneous tissues overlying the gallbladder were generously infiltrated 1% lidocaine for local anesthesia. A coaxial needle was advanced under ultrasound guidance through the skin subcutaneous tissues and a small segment of liver into the gallbladder lumen. With removal of the stylet, spontaneous dark bile drainage occurred. Using modified Seldinger technique, a 10 French drain was placed into the gallbladder fossa, with aspiration of the sample for the lab. Contrast injection confirmed position of the tube within the gallbladder lumen. Drainage catheter was attached to gravity drain with a suture retention placed. Patient tolerated the procedure well and remained hemodynamically stable throughout. No complications were encountered and no significant blood loss encountered. IMPRESSION: Status post percutaneous cholecystostomy Signed, Yvone Neu. Miachel Roux, RPVI [facility]  Electronically Signed   By: Gilmer Mor D.O.   On: 02/13/2022 12:05    Labs:  CBC: Recent Labs    02/12/22 1740 02/14/22 0920  WBC 25.4* 7.5  HGB 12.0 11.1*  HCT 36.8 33.2*  PLT 265 218     COAGS: Recent Labs    02/12/22 2146  INR 1.2     BMP: Recent Labs    02/12/22 1740 02/13/22 0312 02/14/22 0920 02/15/22 0218  NA 137 141 138 139  K 3.1* 3.0* 3.0* 3.9  CL 104 105 102 105  CO2 22 25 27 29   GLUCOSE 112* 100* 101* 90  BUN 20 14 7 7   CALCIUM 8.3* 8.1* 8.1* 8.1*  CREATININE 1.14* 0.87 0.83 0.81  GFRNONAA 55* >60 >60 >60     LIVER FUNCTION TESTS: Recent Labs    02/12/22 1740 02/13/22 0312 02/14/22 0920  BILITOT 0.7 0.7 0.5  AST 19 15 18   ALT 20 16 20   ALKPHOS 88 63 65  PROT 7.3 5.8* 6.2*  ALBUMIN 3.6 2.7* 2.9*     Assessment and Plan:  61 y.o. female inpatient. History of RUQ pain. Presented to the ED at [facility] on 6.28.23 with RUQ abdominal pain, nausea, fever and transient numbness. Found to have acute cholecystitis. IR placed a chole tube on 6.29.23.   Overnight  OP 20 mL in bag today, bile colored  No leukocytosis, BMP stable.  VSS CX showed few gram - ENTEROBACTER CLOACAE   Drain Location: RUQ Size: Fr size: 12 Fr Date of placement: 6/29  Currently to: Drain collection device: gravity 24 hour output:  Output by Drain (mL) 02/15/22 0701 - 02/15/22 1900 02/15/22 1901 - 02/16/22 0700 02/16/22 0701 - 02/16/22 1900 02/16/22 1901 - 02/17/22 0700 02/17/22 0701 - 02/17/22 1115  Closed System Drain Right RUQ 10.2 Fr. 250 25       Interval imaging/drain manipulation:  None   Current examination: Flushes/aspirates easily.  Insertion site unremarkable. Suture and stat lock in place. Dressed appropriately.   Plan: Continue TID flushes with 5 cc NS. Record output Q shift. Dressing changes QD or PRN if soiled.  Call IR APP or on call IR MD if difficulty flushing or sudden change in drain output.  Repeat imaging/possible drain injection once  output < 10 mL/QD (excluding flush material). Consideration for drain removal if output is < 10 mL/QD (excluding flush material), pending discussion with the providing surgical service.  Discharge planning: F/u order placed, flush prescription sent to [facility] outpt pharmacy.   Please contact IR APP or on call IR MD prior to patient d/c to ensure appropriate follow up plans are in place. Typically patient will follow up with IR clinic 6 weeks for perc chole  post d/c for repeat imaging/possible drain injection. IR scheduler will contact patient with date/time of appointment. Patient will need to flush drain QD with 5 cc NS, record output QD, dressing changes every 2-3 days or earlier if soiled.   IR will continue to follow - please call with questions or concerns.    Electronically Signed: Willette Brace, PA-C 02/17/2022, 11:15 AM   I spent a total of 15 Minutes at the patient's bedside AND on the patient's hospital floor or unit, greater than 50% of which was counseling/coordinating care for chole tube placement.

## 2022-02-17 NOTE — Plan of Care (Signed)
  Problem: Education: Goal: Knowledge of General Education information will improve Description Including pain rating scale, medication(s)/side effects and non-pharmacologic comfort measures Outcome: Progressing   Problem: Health Behavior/Discharge Planning: Goal: Ability to manage health-related needs will improve Outcome: Progressing   

## 2022-02-18 LAB — AEROBIC/ANAEROBIC CULTURE W GRAM STAIN (SURGICAL/DEEP WOUND): Gram Stain: NONE SEEN

## 2022-02-19 ENCOUNTER — Other Ambulatory Visit: Payer: Self-pay | Admitting: General Surgery

## 2022-02-19 DIAGNOSIS — K810 Acute cholecystitis: Secondary | ICD-10-CM

## 2022-03-12 ENCOUNTER — Ambulatory Visit: Payer: Self-pay | Admitting: General Surgery

## 2022-03-31 ENCOUNTER — Ambulatory Visit
Admission: RE | Admit: 2022-03-31 | Discharge: 2022-03-31 | Disposition: A | Discharge status: Home / Self Care | Payer: Medicare Other | Source: Ambulatory Visit | Attending: Radiology | Admitting: Radiology

## 2022-03-31 ENCOUNTER — Ambulatory Visit
Admission: RE | Admit: 2022-03-31 | Discharge: 2022-03-31 | Disposition: A | Discharge status: Home / Self Care | Payer: Medicare Other | Source: Ambulatory Visit | Attending: General Surgery | Admitting: General Surgery

## 2022-03-31 ENCOUNTER — Encounter: Payer: Self-pay | Admitting: Radiology

## 2022-03-31 DIAGNOSIS — K810 Acute cholecystitis: Secondary | ICD-10-CM

## 2022-03-31 HISTORY — PX: IR RADIOLOGIST EVAL & MGMT: IMG5224

## 2022-03-31 NOTE — Progress Notes (Signed)
[name], female   DOB: 07/28/1961, 61 y.o.   MRN: [id number]        Chief Complaint: Chole tube  Referring Physician(s): Violeta Gelinas  History of Present Illness: Martha Buck is a 61 y.o. female with past medical history significant for nephrolithiasis and previous knee surgery who underwent placement of image guided cholecystostomy tube on 02/13/2022 (Dr. Loreta Ave).  The patient presents today to the interventional radiology drain clinic for cholecystostomy tube evaluation and management.  She is tolerating the cholecystostomy tube and is presently without complaint.  Specifically, no recurrence of her right upper quadrant abdominal pain.  No fever or chills.  Patient states she is to undergo definitive cholecystectomy next Tuesday, 04/08/2022.  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   History of kidney stones    hx of   History of pneumonia    Joint pain     Past Surgical History:  Procedure Laterality Date   BACK SURGERY     ECTOPIC PREGNANCY SURGERY  1991   IR PERC CHOLECYSTOSTOMY  02/13/2022   IR RADIOLOGIST EVAL & MGMT  03/31/2022   KNEE ARTHROSCOPY WITH MEDIAL MENISECTOMY Right 11/26/2012   Procedure: KNEE ARTHROSCOPY WITH PARTIAL MEDIAL MENISECTOMY,DEBRIDEMENT;  Surgeon: Javier Docker, MD;  Location: [facility];  Service: Orthopedics;  Laterality: Right;   KNEE CLOSED REDUCTION Right 01/27/2017   Procedure: CLOSED MANIPULATION RIGHT KNEE;  Surgeon: Sheral Apley, MD;  Location: [facility];  Service: Orthopedics;  Laterality: Right;   LUMBAR FUSION  03-20-2010   L4 -- L5   TOTAL KNEE ARTHROPLASTY Right 11/11/2016   Procedure: TOTAL KNEE ARTHROPLASTY;  Surgeon: Sheral Apley, MD;  Location[facility] OR;  Service: Orthopedics;  Laterality: Right;    Allergies: Bee venom, Aspirin, Lyrica [pregabalin], and Morphine and  related  Medications: Prior to Admission medications   Medication Sig Start Date End Date Taking? Authorizing Provider  acetaminophen (TYLENOL) 500 MG tablet Take 2 tablets (1,000 mg total) by mouth every 6 (six) hours as needed for mild pain or fever. 02/17/22   Juliet Rude, PA-C  betamethasone dipropionate (DIPROLENE) 0.05 % ointment Apply 1 Application topically in the morning and at bedtime. Both legs 09/26/21   [provider]  cetirizine (ZYRTEC) 10 MG tablet Take 10 mg by mouth daily.    [provider]  cyclobenzaprine (FLEXERIL) 10 MG tablet Take 0.5-1 tablets (5-10 mg total) by mouth 2 (two) times daily as needed for muscle spasms. 03/22/18   Arthor Captain, PA-C  DULoxetine (CYMBALTA) 60 MG capsule Take 60 mg by mouth daily. 01/25/22   [provider]  fluticasone (FLONASE) 50 MCG/ACT nasal spray Place 1-2 sprays into both nostrils daily. 06/03/20   Wieters, Hallie C, PA-C  gabapentin (NEURONTIN) 400 MG capsule Take 2 capsules (800 mg total) by mouth 3 (three) times daily. 02/17/22   Juliet Rude, PA-C  naloxone [facility]) nasal spray 4 mg/0.1 mL Place 1 spray into the nose once as needed (opiod overdose). 01/25/22   [provider]  oxyCODONE (ROXICODONE) 15 MG immediate release tablet Take 15 mg by mouth 5 (five) times daily as needed for severe pain. 01/25/22   [provider]  pravastatin (PRAVACHOL) 10 MG tablet Take 10 mg by mouth daily. 12/26/21   [provider]  sodium chloride flush 0.9 % SOLN injection Use 5 mLs by Intracatheter  route daily. 02/16/22   Han, Aimee H, PA-C     Family History  Problem Relation Age of Onset   Diabetes Mellitus II Mother    Hypertension Mother     Social History   Socioeconomic History   Marital status: Divorced    Spouse name: Not on file   Number of children: Not on file   Years of education: Not on file   Highest education level: Not on file  Occupational History   Not on file   Tobacco Use   Smoking status: Every Day    Packs/day: 0.25    Years: 34.00    Total pack years: 8.50    Types: Cigarettes   Smokeless tobacco: Never   Tobacco comments:    smokes 4 cigs per day  Vaping Use   Vaping Use: Never used  Substance and Sexual Activity   Alcohol use: Yes    Comment: socially   Drug use: No   Sexual activity: Not on file  Other Topics Concern   Not on file  Social History Narrative   Not on file   Social Determinants of Health   Financial Resource Strain: Not on file  Food Insecurity: Not on file  Transportation Needs: Not on file  Physical Activity: Not on file  Stress: Not on file  Social Connections: Not on file    ECOG Status: 1 - Symptomatic but completely ambulatory  Review of Systems: A 12 point ROS discussed and pertinent positives are indicated in the HPI above.  All other systems are negative.  Review of Systems  Vital Signs: BP 92/62   Pulse 79   Temp 98.6 F (37 C)   LMP 04/26/2012   SpO2 99%     Physical Exam  Mallampati Score:     Imaging:  The following examinations were reviewed in detail: CT abdomen and pelvis - 02/12/2022 right upper quadrant ultrasound - 02/12/2022 Image guided cholecystostomy tube placement - 02/13/2022    IR Radiologist Eval & Mgmt  Result Date: 03/31/2022 Please refer to notes tab for details about interventional procedure. (Op Note)   Labs:  CBC: Recent Labs    02/12/22 1740 02/14/22 0920  WBC 25.4* 7.5  HGB 12.0 11.1*  HCT 36.8 33.2*  PLT 265 218    COAGS: Recent Labs    02/12/22 2146  INR 1.2    BMP: Recent Labs    02/12/22 1740 02/13/22 0312 02/14/22 0920 02/15/22 0218  NA 137 141 138 139  K 3.1* 3.0* 3.0* 3.9  CL 104 105 102 105  CO2 22 25 27 29   GLUCOSE 112* 100* 101* 90  BUN 20 14 7 7   CALCIUM 8.3* 8.1* 8.1* 8.1*  CREATININE 1.14* 0.87 0.83 0.81  GFRNONAA 55* >60 >60 >60    LIVER FUNCTION TESTS: Recent Labs    02/12/22 1740 02/13/22 0312  02/14/22 0920  BILITOT 0.7 0.7 0.5  AST 19 15 18   ALT 20 16 20   ALKPHOS 88 63 65  PROT 7.3 5.8* 6.2*  ALBUMIN 3.6 2.7* 2.9*    TUMOR MARKERS: No results for input(s): "AFPTM", "CEA", "CA199", "CHROMGRNA" in the last 8760 hours.  Assessment and Plan:  Martha Buck is a 61 y.o. female with past medical history significant for nephrolithiasis and previous knee surgery who underwent placement of image guided cholecystostomy tube on 02/13/2022 (Dr. ).  The patient presents today to the interventional radiology drain clinic for cholecystostomy tube evaluation and management.  She is tolerating the  cholecystostomy tube and is presently without complaint.  Specifically, no recurrence of her right upper quadrant abdominal pain.  No fever or chills.  Patient states she is to undergo definitive cholecystectomy next Tuesday, 04/08/2022.  The following examinations were reviewed in detail: CT abdomen and pelvis - 02/12/2022 right upper quadrant ultrasound - 02/12/2022 Image guided cholecystostomy tube placement - 02/13/2022  Cholecystostomy tube injection demonstrates the following:  - Appropriately positioned and functioning cholecystostomy tube.   - There are two persistent nonocclusive filling defects within the distal aspect of the CBD worrisome for choledocholithiasis.   PLAN:   - Given concern for nonocclusive choledocholithiasis and with the patient's impending surgery scheduled for next Tuesday, 03/2021, the cholecystostomy tube was maintained to external drainage and should remain in place until the time of definitive cholecystectomy.  - Further evaluation and management of the suspected nonocclusive choledocholithiasis can be performed with ERCP as indicated.  Thank you for this interesting consult.  I greatly enjoyed meeting Martha Buck and look forward to participating in their care.  A copy of this report was sent to the requesting provider on this  date.  Electronically Signed: Simonne Come 03/31/2022, 4:26 PM   I spent a total of 10 Minutes in face to face in clinical consultation, greater than 50% of which was counseling/coordinating care for cholecystostomy tube evaluation and management.

## 2022-04-07 ENCOUNTER — Other Ambulatory Visit: Payer: Self-pay

## 2022-04-07 ENCOUNTER — Encounter (HOSPITAL_COMMUNITY): Payer: Self-pay | Admitting: General Surgery

## 2022-04-07 NOTE — Progress Notes (Signed)
SDW CALL  Patient was given pre-op instructions over the phone. The opportunity was given for the patient to ask questions. No further questions asked. Patient verbalized understanding of instructions given.   PCP - None per pt Cardiologist - none  PPM/ICD -  Device Orders -  Rep Notified -   Chest x-ray - 02-12-22 EKG - 6-58-23 Stress Test - denies ECHO - 07-06-20 Cardiac Cath - denies   ERAS Protcol - clears until 8am PRE-SURGERY Ensure or G2-   COVID TEST- n/a   Anesthesia review: no  Patient denies shortness of breath, fever, cough and chest pain over the phone call   All instructions explained to the patient, with a verbal understanding of the material. Patient agrees to go over the instructions while at home for a better understanding.    Pt reports she is driving self to hospital, educated she must find a ride home.

## 2022-04-08 ENCOUNTER — Ambulatory Visit (HOSPITAL_COMMUNITY): Payer: Medicare Other | Admitting: Certified Registered Nurse Anesthetist

## 2022-04-08 ENCOUNTER — Encounter (HOSPITAL_COMMUNITY)
Admission: RE | Disposition: A | Discharge status: Home / Self Care | Payer: Self-pay | Source: Home / Self Care | Attending: General Surgery

## 2022-04-08 ENCOUNTER — Other Ambulatory Visit: Payer: Self-pay

## 2022-04-08 ENCOUNTER — Encounter (HOSPITAL_COMMUNITY): Payer: Self-pay | Admitting: General Surgery

## 2022-04-08 ENCOUNTER — Inpatient Hospital Stay (HOSPITAL_COMMUNITY)
Admission: RE | Admit: 2022-04-08 | Discharge: 2022-04-11 | DRG: 419 | Disposition: A | Discharge status: Home / Self Care | Payer: Medicare Other | Attending: General Surgery | Admitting: General Surgery

## 2022-04-08 DIAGNOSIS — K819 Cholecystitis, unspecified: Secondary | ICD-10-CM | POA: Diagnosis not present

## 2022-04-08 DIAGNOSIS — K839 Disease of biliary tract, unspecified: Secondary | ICD-10-CM | POA: Diagnosis not present

## 2022-04-08 DIAGNOSIS — Z96651 Presence of right artificial knee joint: Secondary | ICD-10-CM | POA: Diagnosis present

## 2022-04-08 DIAGNOSIS — K838 Other specified diseases of biliary tract: Secondary | ICD-10-CM | POA: Diagnosis not present

## 2022-04-08 DIAGNOSIS — K660 Peritoneal adhesions (postprocedural) (postinfection): Secondary | ICD-10-CM | POA: Diagnosis present

## 2022-04-08 DIAGNOSIS — Z833 Family history of diabetes mellitus: Secondary | ICD-10-CM | POA: Diagnosis not present

## 2022-04-08 DIAGNOSIS — K8012 Calculus of gallbladder with acute and chronic cholecystitis without obstruction: Secondary | ICD-10-CM | POA: Diagnosis present

## 2022-04-08 DIAGNOSIS — K8050 Calculus of bile duct without cholangitis or cholecystitis without obstruction: Secondary | ICD-10-CM | POA: Diagnosis not present

## 2022-04-08 DIAGNOSIS — K9189 Other postprocedural complications and disorders of digestive system: Secondary | ICD-10-CM

## 2022-04-08 DIAGNOSIS — Z9049 Acquired absence of other specified parts of digestive tract: Principal | ICD-10-CM

## 2022-04-08 DIAGNOSIS — Z8249 Family history of ischemic heart disease and other diseases of the circulatory system: Secondary | ICD-10-CM | POA: Diagnosis not present

## 2022-04-08 DIAGNOSIS — F32A Depression, unspecified: Secondary | ICD-10-CM | POA: Diagnosis present

## 2022-04-08 DIAGNOSIS — Z4659 Encounter for fitting and adjustment of other gastrointestinal appliance and device: Secondary | ICD-10-CM | POA: Diagnosis not present

## 2022-04-08 DIAGNOSIS — Z79899 Other long term (current) drug therapy: Secondary | ICD-10-CM

## 2022-04-08 DIAGNOSIS — K8051 Calculus of bile duct without cholangitis or cholecystitis with obstruction: Secondary | ICD-10-CM | POA: Diagnosis not present

## 2022-04-08 HISTORY — PX: CHOLECYSTECTOMY: SHX55

## 2022-04-08 LAB — CBC
HCT: 33.2 % — ABNORMAL LOW (ref 36.0–46.0)
Hemoglobin: 10.8 g/dL — ABNORMAL LOW (ref 12.0–15.0)
MCH: 31.7 pg (ref 26.0–34.0)
MCHC: 32.5 g/dL (ref 30.0–36.0)
MCV: 97.4 fL (ref 80.0–100.0)
Platelets: 259 10*3/uL (ref 150–400)
RBC: 3.41 MIL/uL — ABNORMAL LOW (ref 3.87–5.11)
RDW: 13.3 % (ref 11.5–15.5)
WBC: 7.4 10*3/uL (ref 4.0–10.5)
nRBC: 0 % (ref 0.0–0.2)

## 2022-04-08 SURGERY — LAPAROSCOPIC CHOLECYSTECTOMY WITH INTRAOPERATIVE CHOLANGIOGRAM
Anesthesia: General | Site: Abdomen

## 2022-04-08 MED ORDER — DEXAMETHASONE SODIUM PHOSPHATE 10 MG/ML IJ SOLN
INTRAMUSCULAR | Status: DC | PRN
  Administered 2022-04-08: 10 mg via INTRAVENOUS

## 2022-04-08 MED ORDER — OXYCODONE HCL 5 MG PO TABS
5.0000 mg | ORAL_TABLET | Freq: Once | ORAL | Status: AC | PRN
  Administered 2022-04-08: 5 mg via ORAL

## 2022-04-08 MED ORDER — CHLORHEXIDINE GLUCONATE 0.12 % MT SOLN
OROMUCOSAL | Status: AC
  Administered 2022-04-08: 15 mL via OROMUCOSAL
  Filled 2022-04-08: qty 15

## 2022-04-08 MED ORDER — ESMOLOL HCL 100 MG/10ML IV SOLN
INTRAVENOUS | Status: DC | PRN
  Administered 2022-04-08 (×2): 10 mg via INTRAVENOUS

## 2022-04-08 MED ORDER — ACETAMINOPHEN 10 MG/ML IV SOLN
1000.0000 mg | Freq: Once | INTRAVENOUS | Status: DC | PRN
Start: 2022-04-08 — End: 2022-04-08

## 2022-04-08 MED ORDER — SODIUM CHLORIDE 0.9 % IV SOLN
1.5000 g | INTRAVENOUS | Status: AC
  Filled 2022-04-08: qty 4

## 2022-04-08 MED ORDER — POTASSIUM CHLORIDE IN NACL 20-0.9 MEQ/L-% IV SOLN
INTRAVENOUS | Status: DC
  Filled 2022-04-08 (×3): qty 1000

## 2022-04-08 MED ORDER — MORPHINE SULFATE (PF) 4 MG/ML IV SOLN: 4.0000 mg | INTRAVENOUS | Status: DC | PRN

## 2022-04-08 MED ORDER — ROCURONIUM BROMIDE 10 MG/ML (PF) SYRINGE
PREFILLED_SYRINGE | INTRAVENOUS | Status: DC | PRN
  Administered 2022-04-08: 10 mg via INTRAVENOUS
  Administered 2022-04-08: 30 mg via INTRAVENOUS
  Administered 2022-04-08: 60 mg via INTRAVENOUS

## 2022-04-08 MED ORDER — HYDROMORPHONE HCL 1 MG/ML IJ SOLN
0.5000 mg | INTRAMUSCULAR | Status: DC | PRN
  Administered 2022-04-08 – 2022-04-10 (×2): 0.5 mg via INTRAVENOUS
  Filled 2022-04-08 (×2): qty 0.5

## 2022-04-08 MED ORDER — DIPHENHYDRAMINE HCL 12.5 MG/5ML PO ELIX: 12.5000 mg | ORAL_SOLUTION | Freq: Four times a day (QID) | ORAL | Status: DC | PRN

## 2022-04-08 MED ORDER — FENTANYL CITRATE (PF) 100 MCG/2ML IJ SOLN
25.0000 ug | INTRAMUSCULAR | Status: DC | PRN
  Administered 2022-04-08 (×3): 50 ug via INTRAVENOUS

## 2022-04-08 MED ORDER — LIDOCAINE 2% (20 MG/ML) 5 ML SYRINGE
INTRAMUSCULAR | Status: AC
  Filled 2022-04-08: qty 5

## 2022-04-08 MED ORDER — ONDANSETRON HCL 4 MG/2ML IJ SOLN: 4.0000 mg | Freq: Four times a day (QID) | INTRAMUSCULAR | Status: DC | PRN

## 2022-04-08 MED ORDER — MIDAZOLAM HCL 2 MG/2ML IJ SOLN
INTRAMUSCULAR | Status: AC
  Filled 2022-04-08: qty 2

## 2022-04-08 MED ORDER — ENSURE PRE-SURGERY PO LIQD: 296.0000 mL | Freq: Once | ORAL | Status: DC

## 2022-04-08 MED ORDER — DIPHENHYDRAMINE HCL 50 MG/ML IJ SOLN: 12.5000 mg | Freq: Four times a day (QID) | INTRAMUSCULAR | Status: DC | PRN

## 2022-04-08 MED ORDER — 0.9 % SODIUM CHLORIDE (POUR BTL) OPTIME
TOPICAL | Status: DC | PRN
  Administered 2022-04-08: 1000 mL

## 2022-04-08 MED ORDER — CHLORHEXIDINE GLUCONATE CLOTH 2 % EX PADS: 6.0000 | MEDICATED_PAD | Freq: Once | CUTANEOUS | Status: DC

## 2022-04-08 MED ORDER — HEMOSTATIC AGENTS (NO CHARGE) OPTIME
TOPICAL | Status: DC | PRN
  Administered 2022-04-08: 1

## 2022-04-08 MED ORDER — SUGAMMADEX SODIUM 200 MG/2ML IV SOLN
INTRAVENOUS | Status: DC | PRN
  Administered 2022-04-08: 167.8 mg via INTRAVENOUS

## 2022-04-08 MED ORDER — OXYCODONE HCL 5 MG PO TABS
15.0000 mg | ORAL_TABLET | Freq: Four times a day (QID) | ORAL | Status: DC | PRN
  Administered 2022-04-08 – 2022-04-11 (×3): 15 mg via ORAL
  Filled 2022-04-08 (×3): qty 3

## 2022-04-08 MED ORDER — ROCURONIUM BROMIDE 10 MG/ML (PF) SYRINGE
PREFILLED_SYRINGE | INTRAVENOUS | Status: AC
  Filled 2022-04-08: qty 10

## 2022-04-08 MED ORDER — ACETAMINOPHEN 160 MG/5ML PO SOLN: 1000.0000 mg | Freq: Once | ORAL | Status: DC | PRN

## 2022-04-08 MED ORDER — PROPOFOL 10 MG/ML IV BOLUS
INTRAVENOUS | Status: DC | PRN
  Administered 2022-04-08: 40 mg via INTRAVENOUS
  Administered 2022-04-08: 30 mg via INTRAVENOUS
  Administered 2022-04-08: 130 mg via INTRAVENOUS

## 2022-04-08 MED ORDER — DULOXETINE HCL 60 MG PO CPEP
60.0000 mg | ORAL_CAPSULE | Freq: Every day | ORAL | Status: DC
  Administered 2022-04-08 – 2022-04-10 (×3): 60 mg via ORAL
  Filled 2022-04-08 (×3): qty 1

## 2022-04-08 MED ORDER — CHLORHEXIDINE GLUCONATE 0.12 % MT SOLN: 15.0000 mL | Freq: Once | OROMUCOSAL | Status: AC

## 2022-04-08 MED ORDER — BUPIVACAINE-EPINEPHRINE 0.25% -1:200000 IJ SOLN
INTRAMUSCULAR | Status: DC | PRN
  Administered 2022-04-08: 30 mL

## 2022-04-08 MED ORDER — SODIUM CHLORIDE 0.9 % IR SOLN
Status: DC | PRN
  Administered 2022-04-08 (×2): 1000 mL

## 2022-04-08 MED ORDER — PROPOFOL 10 MG/ML IV BOLUS
INTRAVENOUS | Status: AC
  Filled 2022-04-08: qty 20

## 2022-04-08 MED ORDER — ENOXAPARIN SODIUM 40 MG/0.4ML IJ SOSY: 40.0000 mg | PREFILLED_SYRINGE | INTRAMUSCULAR | Status: DC

## 2022-04-08 MED ORDER — IOHEXOL 300 MG/ML  SOLN
INTRAMUSCULAR | Status: DC | PRN
  Administered 2022-04-08: 100 mL

## 2022-04-08 MED ORDER — ONDANSETRON HCL 4 MG/2ML IJ SOLN
INTRAMUSCULAR | Status: DC | PRN
  Administered 2022-04-08: 4 mg via INTRAVENOUS

## 2022-04-08 MED ORDER — FENTANYL CITRATE (PF) 250 MCG/5ML IJ SOLN
INTRAMUSCULAR | Status: AC
  Filled 2022-04-08: qty 5

## 2022-04-08 MED ORDER — ESMOLOL HCL 100 MG/10ML IV SOLN
INTRAVENOUS | Status: AC
  Filled 2022-04-08: qty 10

## 2022-04-08 MED ORDER — ACETAMINOPHEN 500 MG PO TABS: 1000.0000 mg | ORAL_TABLET | ORAL | Status: AC

## 2022-04-08 MED ORDER — ONDANSETRON HCL 4 MG/2ML IJ SOLN
INTRAMUSCULAR | Status: AC
  Filled 2022-04-08: qty 2

## 2022-04-08 MED ORDER — OXYCODONE HCL 5 MG PO TABS
ORAL_TABLET | ORAL | Status: AC
  Filled 2022-04-08: qty 1

## 2022-04-08 MED ORDER — LIDOCAINE 2% (20 MG/ML) 5 ML SYRINGE
INTRAMUSCULAR | Status: DC | PRN
  Administered 2022-04-08: 60 mg via INTRAVENOUS

## 2022-04-08 MED ORDER — FENTANYL CITRATE (PF) 100 MCG/2ML IJ SOLN
INTRAMUSCULAR | Status: AC
  Filled 2022-04-08: qty 2

## 2022-04-08 MED ORDER — CEFAZOLIN SODIUM-DEXTROSE 2-4 GM/100ML-% IV SOLN
2.0000 g | INTRAVENOUS | Status: AC
  Administered 2022-04-08: 2 g via INTRAVENOUS
  Filled 2022-04-08: qty 100

## 2022-04-08 MED ORDER — DEXMEDETOMIDINE (PRECEDEX) IN NS 20 MCG/5ML (4 MCG/ML) IV SYRINGE
PREFILLED_SYRINGE | INTRAVENOUS | Status: DC | PRN
  Administered 2022-04-08 (×5): 4 ug via INTRAVENOUS

## 2022-04-08 MED ORDER — GABAPENTIN 600 MG PO TABS
600.0000 mg | ORAL_TABLET | Freq: Two times a day (BID) | ORAL | Status: DC
  Administered 2022-04-08 – 2022-04-11 (×6): 600 mg via ORAL
  Filled 2022-04-08 (×6): qty 1

## 2022-04-08 MED ORDER — ACETAMINOPHEN 500 MG PO TABS
1000.0000 mg | ORAL_TABLET | Freq: Three times a day (TID) | ORAL | Status: DC
  Administered 2022-04-08 – 2022-04-11 (×7): 1000 mg via ORAL
  Filled 2022-04-08 (×8): qty 2

## 2022-04-08 MED ORDER — OXYCODONE HCL 5 MG/5ML PO SOLN: 5.0000 mg | Freq: Once | ORAL | Status: AC | PRN

## 2022-04-08 MED ORDER — ONDANSETRON 4 MG PO TBDP
4.0000 mg | ORAL_TABLET | Freq: Four times a day (QID) | ORAL | Status: DC | PRN
  Administered 2022-04-11: 4 mg via ORAL
  Filled 2022-04-08: qty 1

## 2022-04-08 MED ORDER — ACETAMINOPHEN 500 MG PO TABS
ORAL_TABLET | ORAL | Status: AC
  Administered 2022-04-08: 1000 mg via ORAL
  Filled 2022-04-08: qty 2

## 2022-04-08 MED ORDER — MIDAZOLAM HCL 5 MG/5ML IJ SOLN
INTRAMUSCULAR | Status: DC | PRN
  Administered 2022-04-08: 2 mg via INTRAVENOUS

## 2022-04-08 MED ORDER — DEXMEDETOMIDINE HCL IN NACL 80 MCG/20ML IV SOLN
INTRAVENOUS | Status: AC
  Filled 2022-04-08: qty 20

## 2022-04-08 MED ORDER — SODIUM CHLORIDE 0.9 % IV SOLN: 1.5000 g | Freq: Once | INTRAVENOUS | Status: DC

## 2022-04-08 MED ORDER — FENTANYL CITRATE (PF) 250 MCG/5ML IJ SOLN
INTRAMUSCULAR | Status: DC | PRN
  Administered 2022-04-08: 100 ug via INTRAVENOUS
  Administered 2022-04-08: 50 ug via INTRAVENOUS
  Administered 2022-04-08: 100 ug via INTRAVENOUS

## 2022-04-08 MED ORDER — DEXAMETHASONE SODIUM PHOSPHATE 10 MG/ML IJ SOLN
INTRAMUSCULAR | Status: AC
  Filled 2022-04-08: qty 1

## 2022-04-08 MED ORDER — LACTATED RINGERS IV SOLN: INTRAVENOUS | Status: DC

## 2022-04-08 MED ORDER — ACETAMINOPHEN 500 MG PO TABS: 1000.0000 mg | ORAL_TABLET | Freq: Once | ORAL | Status: DC | PRN

## 2022-04-08 MED ORDER — ORAL CARE MOUTH RINSE
15.0000 mL | Freq: Once | OROMUCOSAL | Status: AC
Start: 2022-04-08 — End: 2022-04-08

## 2022-04-08 SURGICAL SUPPLY — 49 items
APPLIER CLIP 5 13 M/L LIGAMAX5 (MISCELLANEOUS) ×1
BIOPATCH RED 1 DISK 7.0 (GAUZE/BANDAGES/DRESSINGS) IMPLANT
CANISTER SUCT 3000ML PPV (MISCELLANEOUS) ×1 IMPLANT
CHLORAPREP W/TINT 26 (MISCELLANEOUS) ×1 IMPLANT
CLIP APPLIE 5 13 M/L LIGAMAX5 (MISCELLANEOUS) ×1 IMPLANT
COVER MAYO STAND STRL (DRAPES) ×1 IMPLANT
COVER SURGICAL LIGHT HANDLE (MISCELLANEOUS) ×1 IMPLANT
DERMABOND ADVANCED (GAUZE/BANDAGES/DRESSINGS) ×1
DERMABOND ADVANCED .7 DNX12 (GAUZE/BANDAGES/DRESSINGS) ×1 IMPLANT
DRAIN CHANNEL 19F RND (DRAIN) IMPLANT
DRAPE C-ARM 42X120 X-RAY (DRAPES) ×1 IMPLANT
DRSG TEGADERM 4X4.75 (GAUZE/BANDAGES/DRESSINGS) IMPLANT
ELECT REM PT RETURN 9FT ADLT (ELECTROSURGICAL) ×1
ELECTRODE REM PT RTRN 9FT ADLT (ELECTROSURGICAL) ×1 IMPLANT
EVACUATOR SILICONE 100CC (DRAIN) IMPLANT
GLOVE BIO SURGEON STRL SZ8 (GLOVE) ×1 IMPLANT
GLOVE BIOGEL PI IND STRL 8 (GLOVE) ×1 IMPLANT
GLOVE BIOGEL PI INDICATOR 8 (GLOVE) ×1
GOWN STRL REUS W/ TWL LRG LVL3 (GOWN DISPOSABLE) ×2 IMPLANT
GOWN STRL REUS W/ TWL XL LVL3 (GOWN DISPOSABLE) ×1 IMPLANT
GOWN STRL REUS W/TWL LRG LVL3 (GOWN DISPOSABLE) ×2
GOWN STRL REUS W/TWL XL LVL3 (GOWN DISPOSABLE) ×1
HEMOSTAT SNOW SURGICEL 2X4 (HEMOSTASIS) IMPLANT
KIT BASIN OR (CUSTOM PROCEDURE TRAY) ×1 IMPLANT
KIT TURNOVER KIT B (KITS) ×1 IMPLANT
L-HOOK LAP DISP 36CM (ELECTROSURGICAL) ×1
LHOOK LAP DISP 36CM (ELECTROSURGICAL) ×1 IMPLANT
NEEDLE 22X1 1/2 (OR ONLY) (NEEDLE) ×1 IMPLANT
NS IRRIG 1000ML POUR BTL (IV SOLUTION) ×1 IMPLANT
PAD ARMBOARD 7.5X6 YLW CONV (MISCELLANEOUS) ×1 IMPLANT
PENCIL BUTTON HOLSTER BLD 10FT (ELECTRODE) ×1 IMPLANT
POUCH RETRIEVAL ECOSAC 10 (ENDOMECHANICALS) ×1 IMPLANT
POUCH RETRIEVAL ECOSAC 10MM (ENDOMECHANICALS) ×1
SCISSORS LAP 5X35 DISP (ENDOMECHANICALS) ×1 IMPLANT
SET CHOLANGIOGRAPH 5 50 .035 (SET/KITS/TRAYS/PACK) ×1 IMPLANT
SET IRRIG TUBING LAPAROSCOPIC (IRRIGATION / IRRIGATOR) ×1 IMPLANT
SET TUBE SMOKE EVAC HIGH FLOW (TUBING) ×1 IMPLANT
SLEEVE Z-THREAD 5X100MM (TROCAR) IMPLANT
SPECIMEN JAR SMALL (MISCELLANEOUS) ×1 IMPLANT
SUT ETHILON 2 0 FS 18 (SUTURE) IMPLANT
SUT VIC AB 4-0 PS2 27 (SUTURE) ×1 IMPLANT
SUT VICRYL 0 UR6 27IN ABS (SUTURE) IMPLANT
TOWEL GREEN STERILE (TOWEL DISPOSABLE) ×1 IMPLANT
TRAY LAPAROSCOPIC MC (CUSTOM PROCEDURE TRAY) ×1 IMPLANT
TROCAR 11X100 Z THREAD (TROCAR) IMPLANT
TROCAR BALLN 12MMX100 BLUNT (TROCAR) IMPLANT
TROCAR Z-THREAD OPTICAL 5X100M (TROCAR) IMPLANT
WARMER LAPAROSCOPE (MISCELLANEOUS) ×1 IMPLANT
WATER STERILE IRR 1000ML POUR (IV SOLUTION) ×1 IMPLANT

## 2022-04-08 NOTE — H&P (Signed)
PROVIDER:  Janey Greaser, MD   MRN: [id number] DOB: 1961/01/27 DATE OF ENCOUNTER: 03/12/2022 Subjective     Chief Complaint: No chief complaint on file.       History of Present Illness: Martha Buck is a 61 y.o. female who is seen today for follow-up status post hospitalization for severe acute cholecystitis.  She underwent percutaneous cholecystostomy tube on 02/13/2022.  She gradually improved during her hospitalization and was treated with intravenous antibiotics.  Since discharge, she is doing better.  She is still unable to eat large meals.  Fried foods are quite irritating to her abdomen with some pain in the right upper quadrant.  She has been trying to avoid that.  Otherwise she is moving her bowels, her tube is working, and she does not have abdominal pain anymore..     Review of Systems: A complete review of systems was obtained from the patient.  I have reviewed this information and discussed as appropriate with the patient.  See HPI as well for other ROS.   ROS      Medical History: Past Medical History  History reviewed. No pertinent past medical history.     There is no problem list on file for this patient.     Past Surgical History  History reviewed. No pertinent surgical history.      Allergies       Allergies  Allergen Reactions   Venom-Honey Bee Anaphylaxis   Aspirin Itching   Morphine Itching   Opioids - Morphine Analogues Itching   Pregabalin Rash      Pt. States that she breaks out in hives when she takes Lyrica.    Pt. States that she breaks out in hives when she takes Lyrica.              Current Outpatient Medications on File Prior to Visit  Medication Sig Dispense Refill   acetaminophen (TYLENOL) 500 MG tablet Take by mouth       betamethasone dipropionate (DIPROSONE) 0.05 % ointment Apply topically       cyclobenzaprine (FLEXERIL) 10 MG tablet TAKE 1 TABLET BY MOUTH 2 TO 4 TIMES DAILY       DULoxetine (CYMBALTA) 60 MG DR capsule  Take by mouth       fluticasone propionate (FLONASE) 50 mcg/actuation nasal spray Place into one nostril       gabapentin (NEURONTIN) 400 MG capsule Take 800 mg by mouth 3 (three) times daily       pravastatin (PRAVACHOL) 10 MG tablet Take 10 mg by mouth once daily       cetirizine (ZYRTEC) 10 MG tablet Take 10 mg by mouth once daily       naloxone (NARCAN) 4 mg/actuation nasal spray 1 (ONE) SPRAY AS NEEDED, IF FOUND UNRESPONSIVE THEN SPRAY THIS INTO NOSE AND CALL 911 IMMEDIATELY       oxyCODONE (ROXICODONE) 15 MG immediate release tablet TAKE 1 (ONE) TABLET FIVE TIMES DAILY, AS NEEDED        No current facility-administered medications on file prior to visit.      Family History  History reviewed. No pertinent family history.      Social History       Tobacco Use  Smoking Status Not on file  Smokeless Tobacco Not on file      Social History  Social History        Socioeconomic History   Marital status: Unknown        Objective:  Vitals:    03/12/22 0917  Weight: 84.4 kg (186 lb)  Height: 175.3 cm (5\' 9" )    Body mass index is 27.47 kg/m.   Physical Exam    General appearance - no distress, ambulates with a walker Mental status - alert, oriented to person, place, and time Chest - clear to auscultation, no wheezes, rales or rhonchi, symmetric air entry Abdomen - soft, nontender, percutaneous cholecystostomy tube exits the right upper quadrant.  The site has no cellulitis.  Sutures are intact.  I placed a new dressing today.  No generalized tenderness.  Just a little bit of soreness right at the tube site. Extremities - limited lower extremity joint mobility     Labs, Imaging and Diagnostic Testing:   Reviewed in epic     Assessment and Plan:     Diagnoses and all orders for this visit:   Calculus of gallbladder with acute on chronic cholecystitis without obstruction       Status post percutaneous cholecystostomy tube 02/13/2022.  She is improving  and I would like to schedule laparoscopic cholecystectomy with cholangiogram.  I discussed the procedure, risks, and benefits with her.  I will also plan 23-hour observation afterwards due to her significant mobility restrictions.  I answered her questions.  She is agreeable.  04/08/22 update: Cholangiogram via percutaneous cholecystostomy tube reviewed with radiology.  Looks like she has some small common bile duct stone so we will plan to do a cholangiogram today.  I discussed this with her in detail including the possibility of need for GI consultation and ERCP if she does still have stones in her common bile duct today.  She is agreeable.

## 2022-04-08 NOTE — Anesthesia Preprocedure Evaluation (Signed)
Anesthesia Evaluation  Patient identified by MRN, date of birth, ID band Patient awake    Reviewed: Allergy & Precautions, NPO status , Patient's Chart, lab work & pertinent test results  History of Anesthesia Complications Negative for: history of anesthetic complications  Airway Mallampati: III  TM Distance: >3 FB Neck ROM: Full    Dental  (+) Teeth Intact   Pulmonary neg shortness of breath, neg COPD, Current Smoker and Patient abstained from smoking.,    breath sounds clear to auscultation       Cardiovascular  Rhythm:Regular  1. Left ventricular ejection fraction, by estimation, is 50 to 55%. The  left ventricle has low normal function. The left ventricle has no regional  wall motion abnormalities. There is moderate asymmetric left ventricular  hypertrophy of the septal  segment. Left ventricular diastolic parameters are consistent with Grade I  diastolic dysfunction (impaired relaxation). There is incoordinate septal  motion.  2. Right ventricular systolic function is normal. The right ventricular  size is normal. There is normal pulmonary artery systolic pressure.  3. The mitral valve is grossly normal. Trivial mitral valve  regurgitation.  4. The aortic valve is tricuspid. Aortic valve regurgitation is not  visualized.  5. The inferior vena cava is normal in size with greater than 50%  respiratory variability, suggesting right atrial pressure of 3 mmHg.    Neuro/Psych PSYCHIATRIC DISORDERS Depression negative neurological ROS     GI/Hepatic Neg liver ROS, CHOLECYSTITIS   Endo/Other  No results found for: "HGBA1C"   Renal/GU negative Renal ROS     Musculoskeletal  (+) Arthritis ,   Abdominal   Peds  Hematology  (+) Blood dyscrasia, anemia , Lab Results      Component                Value               Date                      WBC                      7.4                 04/08/2022                 HGB                      10.8 (L)            04/08/2022                HCT                      33.2 (L)            04/08/2022                MCV                      97.4                04/08/2022                PLT                      259  04/08/2022              Anesthesia Other Findings   Reproductive/Obstetrics                             Anesthesia Physical Anesthesia Plan  ASA: 2  Anesthesia Plan: General   Post-op Pain Management: Tylenol PO (pre-op)*   Induction: Intravenous  PONV Risk Score and Plan: 2 and Ondansetron and Dexamethasone  Airway Management Planned: Oral ETT  Additional Equipment: None  Intra-op Plan:   Post-operative Plan: Extubation in OR  Informed Consent: I have reviewed the patients History and Physical, chart, labs and discussed the procedure including the risks, benefits and alternatives for the proposed anesthesia with the patient or authorized representative who has indicated his/her understanding and acceptance.     Dental advisory given  Plan Discussed with: CRNA  Anesthesia Plan Comments:         Anesthesia Quick Evaluation

## 2022-04-08 NOTE — Progress Notes (Signed)
Pt found up in room walking from restroom. Pt stated that she called out 5 times. This RN and CNA didn't receive call. Pt crying out that she is in pain. This RN had scheduled tylenol in pocket. Pt stated that pain level is a 15. Went to give pt something a little stronger. Pt apoligized for getting up. Bed alarm is set and asked pt to call out when help is needed. She was understanding.

## 2022-04-08 NOTE — H&P (View-Only) (Signed)
Consultation Note   Referring Provider: [facility] PCP: System, Provider Not In Primary Gastroenterologist: Unassigned  Reason for consultation: Choledocholithiasis  Hospital Day: 1  Assessment / Plan   # 61 yo female with cholelithiasis / cholecystitis s/p perc drain in June, underwent laparoscopic subtotal cholecystectomy today. Cholangiogram on 8/14 positive for choledocholithiasis.  Will plan for ERCP tomorrow at 10:45. Spoke briefly to her about the procedure and she seems agreeable but is not awake enough to sign a consent. Will need to consent her in am.  Dose of Unasyn ordered to be given prior to procedure. I didn't order indocin suppositories for prevention of post ERCP pancreatitis because she has allergy to asa (itching)  # See PMH for additional medical problems   Attending Physician Note   I have taken a history, reviewed the chart and examined the patient. I performed a substantive portion of this encounter, including complete performance of at least one of the key components, in conjunction with the APP. I agree with the APP's note, impression and recommendations with my edits. My additional impressions and recommendations are as follows.   *Cholecystitis, cholelithiasis S/P perc drain in June. Choledocholithiasis on 8/14 cholangiogram: 2 CBD filling defects c/w stones. S/P laparoscopic subtotal cholecystectomy and LOA today - impending bile leak. JP drain placed.   ERCP sphincterotomy, stone removal and biliary stent placement, scheduled at 0930 tomorrow.  CMP today.   Claudette Head, MD [facility] See Loretha Stapler, [facility], for our on call provider     HPI   Martha Buck is a 61 y.o. female with a past medical history significant for  arthritis See PMH for any additional medical problems.  Martha Buck was hospitalized late June with RUQ pain acute cholecystitis  Due to long standing symptoms and very thick GB wall a  cholecystectomy was felt to risky with high chance of subtotal cholecystectomy and bile leak as well as other complications. She underwent perc chole tube with plans for interval lap chole later.    Martha Buck underwent a laparoscopic subtotal cholecystectomy and LOA today. A large number of omental adhesions in right abdomen was found. There were severe adhesions towards the infundibulum which predcluded safe dissection of the cystic duct so a subtotal cholecystectomy was done Cholangiogram showed some small CBD stones.  Patient is still a little sleepy from sedation. She denies any other chronic GI problems     Previous GI Evaluation      Recent Labs and Imaging DG CHOLANGIOGRAM  EXISTING TUBE  Result Date: 03/31/2022 INDICATION: History of acute cholecystitis, post ultrasound fluoroscopic guided cholecystostomy tube placement on 02/13/2022. Patient presents to the interventional radiology drain clinic for cholangiogram prior to definitive cholecystectomy scheduled for next Tuesday, 03/2021. She is tolerated the cholecystostomy tube well and is presently without complaint. EXAM: FLUOROSCOPIC GUIDED CHOLECYSTOSTOMY TUBE INJECTION COMPARISON:  Image guided cholecystostomy tube placement-02/13/2022; right upper quadrant abdominal ultrasound-02/12/2022 MEDICATIONS: None ANESTHESIA/SEDATION: None CONTRAST:  20 cc Omnipaque 300, administered into the gallbladder lumen FLUOROSCOPY TIME:  1 minute, 12 seconds (30.2 mGy) COMPLICATIONS: None immediate. PROCEDURE: The patient was positioned supine on the fluoroscopy table. A preprocedural spot fluoroscopic image was obtained of the right upper abdominal quadrant existing cholecystostomy tube. Multiple spot fluoroscopic radiographic images were obtained  of the right upper abdominal quadrant an existing cholecystostomy tube following injection of a small amount of contrast. Images were reviewed and discussed with the patient. The cholecystostomy tube was flushed with  a small amount of saline and capped. A dressing was placed. The patient tolerated the procedure well without immediate postprocedural complication. FINDINGS: Preprocedural spot fluoroscopic image of the right upper abdominal quadrant demonstrates grossly unchanged positioning of the cholecystostomy tube with end coiled and locked overlying the expected location of the gallbladder. Subsequent contrast injection demonstrates appropriate positioning and functionality of the cholecystostomy tube with brisk opacification of the gallbladder. There is passage of contrast from the gallbladder through the cystic and common bile ducts the level duodenum. There are two persistent nonocclusive filling defects within the distal aspect of the CBD worrisome for choledocholithiasis. IMPRESSION: 1. Appropriately positioned and functioning cholecystostomy tube. 2. There are two persistent nonocclusive filling defects within the distal aspect of the CBD worrisome for choledocholithiasis. Further evaluation and management with ERCP could performed as indicated. PLAN: - Given concern for nonocclusive choledocholithiasis and with the patient's impending surgery scheduled for next Tuesday, 03/2021, the cholecystostomy tube was maintained to external drainage and should remain in place until the time of definitive cholecystectomy. Electronically Signed   By: Simonne Come M.D.   On: 03/31/2022 16:26   IR Radiologist Eval & Mgmt  Result Date: 03/31/2022 Please refer to notes tab for details about interventional procedure. (Op Note)   Labs:  Recent Labs    04/08/22 0946  WBC 7.4  HGB 10.8*  HCT 33.2*  PLT 259   No results for input(s): "NA", "K", "CL", "CO2", "GLUCOSE", "BUN", "CREATININE", "CALCIUM" in the last 72 hours. No results for input(s): "PROT", "ALBUMIN", "AST", "ALT", "ALKPHOS", "BILITOT", "BILIDIR", "IBILI" in the last 72 hours. No results for input(s): "HEPBSAG", "HCVAB", "HEPAIGM", "HEPBIGM" in the last 72  hours. No results for input(s): "LABPROT", "INR" in the last 72 hours.  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   History of kidney stones    hx of   History of pneumonia    Joint pain     Past Surgical History:  Procedure Laterality Date   BACK SURGERY     ECTOPIC PREGNANCY SURGERY  1991   IR PERC CHOLECYSTOSTOMY  02/13/2022   IR RADIOLOGIST EVAL & MGMT  03/31/2022   KNEE ARTHROSCOPY WITH MEDIAL MENISECTOMY Right 11/26/2012   Procedure: KNEE ARTHROSCOPY WITH PARTIAL MEDIAL MENISECTOMY,DEBRIDEMENT;  Surgeon: Javier Docker, MD;  Location: [facility];  Service: Orthopedics;  Laterality: Right;   KNEE CLOSED REDUCTION Right 01/27/2017   Procedure: CLOSED MANIPULATION RIGHT KNEE;  Surgeon: Sheral Apley, MD;  Location: [facility];  Service: Orthopedics;  Laterality: Right;   LUMBAR FUSION  03-20-2010   L4 -- L5   TOTAL KNEE ARTHROPLASTY Right 11/11/2016   Procedure: TOTAL KNEE ARTHROPLASTY;  Surgeon: Sheral Apley, MD;  Location: [facility] OR;  Service: Orthopedics;  Laterality: Right;    Family History  Problem Relation Age of Onset   Diabetes Mellitus II Mother    Hypertension Mother     Prior to Admission medications   Medication Sig Start Date End Date Taking? Authorizing Provider  acetaminophen (TYLENOL) 500 MG tablet Take 2 tablets (1,000 mg total) by mouth every 6 (six) hours as needed for mild pain or fever. 02/17/22  Yes Juliet Rude, PA-C  DULoxetine (CYMBALTA)  60 MG capsule Take 60 mg by mouth at bedtime. 01/25/22  Yes [provider]  fluticasone (FLONASE) 50 MCG/ACT nasal spray Place 1-2 sprays into both nostrils daily. Patient taking differently: Place 1-2 sprays into both nostrils daily as needed for allergies. 06/03/20  Yes Wieters, Hallie C, PA-C  gabapentin (NEURONTIN) 600 MG tablet Take 600 mg by mouth 2 (two) times daily. 03/25/22  Yes [provider]   naloxone (NARCAN) nasal spray 4 mg/0.1 mL Place 1 spray into the nose once as needed (opiod overdose). 01/25/22  Yes [provider]  oxyCODONE (ROXICODONE) 15 MG immediate release tablet Take 15 mg by mouth 4 (four) times daily as needed for severe pain. 01/25/22  Yes [provider]  gabapentin (NEURONTIN) 400 MG capsule Take 2 capsules (800 mg total) by mouth 3 (three) times daily. Patient not taking: Reported on 04/07/2022 02/17/22   Norm Parcel, PA-C  pravastatin (PRAVACHOL) 10 MG tablet Take 10 mg by mouth daily. 12/26/21   [provider]  sodium chloride flush 0.9 % SOLN injection Use 5 mLs by Intracatheter route daily. 02/16/22   Han, Aimee H, PA-C    Current Facility-Administered Medications  Medication Dose Route Frequency Provider Last Rate Last Admin   acetaminophen (OFIRMEV) IV 1,000 mg  1,000 mg Intravenous Once PRN Oleta Mouse, MD       acetaminophen (TYLENOL) tablet 1,000 mg  1,000 mg Oral Once PRN Oleta Mouse, MD       Or   acetaminophen (TYLENOL) 160 MG/5ML solution 1,000 mg  1,000 mg Oral Once PRN Oleta Mouse, MD       Chlorhexidine Gluconate Cloth 2 % PADS 6 each  6 each Topical Once Georganna Skeans, MD       And   Chlorhexidine Gluconate Cloth 2 % PADS 6 each  6 each Topical Once Georganna Skeans, MD       feeding supplement (ENSURE PRE-SURGERY) liquid 296 mL  296 mL Oral Once Georganna Skeans, MD       fentaNYL (SUBLIMAZE) 100 MCG/2ML injection            fentaNYL (SUBLIMAZE) injection 25-50 mcg  25-50 mcg Intravenous Q5 min PRN Oleta Mouse, MD   50 mcg at 04/08/22 1400   lactated ringers infusion   Intravenous Continuous Oleta Mouse, MD   Stopped at 04/08/22 1316   oxyCODONE (Oxy IR/ROXICODONE) immediate release tablet 5 mg  5 mg Oral Once PRN Oleta Mouse, MD       Or   oxyCODONE (ROXICODONE) 5 MG/5ML solution 5 mg  5 mg Oral Once PRN Oleta Mouse, MD        Allergies as of 03/24/2022 - Review  Complete 02/13/2022  Allergen Reaction Noted   Bee venom Anaphylaxis 10/08/2015   Aspirin Itching 11/27/2013   Lyrica [pregabalin] Rash 11/11/2016   Morphine and related Itching 11/24/2012    Social History   Socioeconomic History   Marital status: Divorced    Spouse name: Not on file   Number of children: Not on file   Years of education: Not on file   Highest education level: Not on file  Occupational History   Not on file  Tobacco Use   Smoking status: Every Day    Packs/day: 0.25    Years: 34.00    Total pack years: 8.50    Types: Cigarettes   Smokeless tobacco: Never   Tobacco comments:    smokes 4 cigs per day  Vaping Use   Vaping  Use: Never used  Substance and Sexual Activity   Alcohol use: Yes    Comment: socially   Drug use: No   Sexual activity: Not on file  Other Topics Concern   Not on file  Social History Narrative   Not on file   Social Determinants of Health   Financial Resource Strain: Not on file  Food Insecurity: Not on file  Transportation Needs: Not on file  Physical Activity: Not on file  Stress: Not on file  Social Connections: Not on file  Intimate Partner Violence: Not on file    Review of Systems: Unable to obtain. She is sleepy from sedation.  Physical Exam: Vital signs in last 24 hours: Temp:  [97.7 F (36.5 C)-97.9 F (36.6 C)] 97.7 F (36.5 C) (08/22 1330) Pulse Rate:  [67-78] 67 (08/22 1400) Resp:  [17-25] 17 (08/22 1415) BP: (128-140)/(74-87) 128/75 (08/22 1415) SpO2:  [97 %-100 %] 100 % (08/22 1400) Weight:  [83.9 kg] 83.9 kg (08/22 0826)    General:  Sleepy female in NAD Psych:  Pleasant, cooperative.  Eyes: Pupils equal, no icterus. Conjunctive pink Ears:  Normal auditory acuity Nose: No deformity, discharge or lesions Neck:  Supple, no masses felt Lungs:  Clear to auscultation.  Heart:  Regular rate, regular rhythm. No lower extremity edema Abdomen:  Soft, large surgical dressing intact. Drainage bulb with  bloody output.  Rectal :  Deferred Msk: Symmetrical without gross deformities.  Neurologic:  Sleepy,  oriented, grossly normal neurologically Skin:  Intact without significant lesions.    Intake/Output from previous day: No intake/output data recorded. Intake/Output this shift:  Total I/O In: 2200 [I.V.:2100; IV Piggyback:100] Out: 900 [Drains:550; Blood:350]    Principal Problem:   S/P laparoscopic cholecystectomy    Tye Savoy, NP-C @  04/08/2022, 2:51 PM

## 2022-04-08 NOTE — Op Note (Signed)
04/08/2022  1:03 PM  PATIENT:  Martha Buck  61 y.o. female  PRE-OPERATIVE DIAGNOSIS:  CHOLECYSTITIS  POST-OPERATIVE DIAGNOSIS:  CHOLECYSTITIS  PROCEDURE:  Procedure(s): LAPAROSCOPIC LYSIS OF ADHESIONS LAPAROSCOPIC SUBTOTAL CHOLECYSTECTOMY  SURGEON:  Surgeon(s): Violeta Gelinas, MD  ASSISTANTS: Darron Doom, RNFA   ANESTHESIA:   local and general  EBL:  Total I/O In: 2200 [I.V.:2100; IV Piggyback:100] Out: 350 [Blood:350]  BLOOD ADMINISTERED:none  DRAINS: (1) Jackson-Pratt drain(s) with closed bulb suction in the r abd    SPECIMEN:  Excision  DISPOSITION OF SPECIMEN:  PATHOLOGY  COUNTS:  YES  DICTATION: .Dragon Dictation History of present illness: Patient with a history of severe cholecystitis the end of June.  She underwent percutaneous cholecystostomy tube placement because of the severity of the inflammation and how long it had been going on.  Follow-up cholangiogram via her cholecystostomy tube recently revealed some common duct stones.  She presents today for cholecystectomy.  I discussed in detail with her that she may need an ERCP postoperatively.    Procedure in detail: Informed consent was obtained.  Patient received intravenous antibiotics.  She was taken the operating room and general endotracheal anesthesia was administered by the anesthesia staff.  Her abdomen was prepped and draped in a sterile fashion.  We did a timeout procedure.The infraumbilical region was infiltrated with local. Infraumbilical incision was made. Subcutaneous tissues were dissected down revealing the anterior fascia. This was divided sharply along the midline. Peritoneal cavity was entered under direct vision without complication. A 0 Vicryl pursestring was placed around the fascial opening. Hassan trocar was inserted into the abdomen. The abdomen was insufflated with carbon dioxide in standard fashion.  Laparoscopic exploration revealed a large number of omental adhesions in the  right abdomen obscuring the liver and the gallbladder.  I was able to place a 5 mm port in the right lower quadrant and began careful and gentle adhesiolysis.  These adhesions were involving the omentum.  Hemostasis was obtained with cautery.  This was tedious.  I was eventually able to place an additional right abdominal 5 mm port.  I continued adhesiolysis revealing the gallbladder.  There was significant adhesions up in the right upper quadrant around the gallbladder and the liver.  I was able to see where the cholecystostomy tube drain entered the abdominal wall and went into the liver.  Further adhesiolysis was accomplished with a total of 60 minutes.  I was able to then safely place a 5 mm epigastric port and another more medial 5 mm right abdominal port.  Local was used at all the port sites.  I retracted the gallbladder superior and medially.  A lot of omental adhesions over the gallbladder were gently taken down.  These were very dense.  I used cautery to get good hemostasis.  There were severe adhesions more down towards the infundibulum and I determined that this precluded safe dissection of the cystic duct.  I then transition to a subtotal cholecystectomy.  I upsized the epigastric port to an 11 mm port I then took the gallbladder down from the dome down.  The posterior wall was gangrenous.  I was able to suction out a large number of stones.  I completed the subtotal cholecystectomy using cautery.  The cystic artery was clipped.  I removed the gallbladder from the infundibulum distally and this was placed in a bag.  It was removed from the abdomen and sent to pathology.  The area was then copiously irrigated.  I cauterized the  liver bed to get good hemostasis.  I removed all of the stones I could find down from the infundibulum.  I thoroughly irrigated the area and the right upper quadrant and involving the omentum where the adhesiolysis was done.  Hemostasis was ensured.  I then placed a piece of  Surgicel snow in the liver bed.  I placed 19 [place] Blake drain via the right upper quadrant port site into the liver bed as well.  This was secured with nylon suture.  The abdomen was then inspected again laparoscopically and there was good hemostasis.  Ports were removed under direct vision.  Pneumoperitoneum was released.  I closed the infra umbilical fascia by tying the pursestring.  I then placed a figure-of-eight 0 Vicryl suture to close the fascia of the epigastric port as well.  All 4 remaining wounds were irrigated and the skin of each was closed with 4-0 Vicryl followed by Dermabond.  All counts were correct.  She tolerated the procedure well without apparent complication was taken recovery in stable condition.  PATIENT DISPOSITION:  PACU - hemodynamically stable.   Delay start of Pharmacological VTE agent (>24hrs) due to surgical blood loss or risk of bleeding:  no  Violeta Gelinas, MD, MPH, FACS Pager: [phone]  8/22/20231:03 PM

## 2022-04-08 NOTE — Consult Note (Addendum)
Consultation Note   Referring Provider: [facility] PCP: System, Provider Not In Primary Gastroenterologist: Unassigned  Reason for consultation: Choledocholithiasis  Hospital Day: 1  Assessment / Plan   # 61 yo female with cholelithiasis / cholecystitis s/p perc drain in June, underwent laparoscopic subtotal cholecystectomy today. Cholangiogram on 8/14 positive for choledocholithiasis.  Will plan for ERCP tomorrow at 10:45. Spoke briefly to her about the procedure and she seems agreeable but is not awake enough to sign a consent. Will need to consent her in am.  Dose of Unasyn ordered to be given prior to procedure. I didn't order indocin suppositories for prevention of post ERCP pancreatitis because she has allergy to asa (itching)  # See PMH for additional medical problems   Attending Physician Note   I have taken a history, reviewed the chart and examined the patient. I performed a substantive portion of this encounter, including complete performance of at least one of the key components, in conjunction with the APP. I agree with the APP's note, impression and recommendations with my edits. My additional impressions and recommendations are as follows.   *Cholecystitis, cholelithiasis S/P perc drain in June. Choledocholithiasis on 8/14 cholangiogram: 2 CBD filling defects c/w stones. S/P laparoscopic subtotal cholecystectomy and LOA today - impending bile leak. JP drain placed.   ERCP sphincterotomy, stone removal and biliary stent placement, scheduled at 0930 tomorrow.  CMP today.   Claudette Head, MD [facility] See Loretha Stapler, [facility], for our on call provider     HPI   Martha Buck is a 61 y.o. female with a past medical history significant for  arthritis See PMH for any additional medical problems.  [name] was hospitalized late June with RUQ pain acute cholecystitis  Due to long standing symptoms and very thick GB wall a  cholecystectomy was felt to risky with high chance of subtotal cholecystectomy and bile leak as well as other complications. She underwent perc chole tube with plans for interval lap chole later.    Martha Buck underwent a laparoscopic subtotal cholecystectomy and LOA today. A large number of omental adhesions in right abdomen was found. There were severe adhesions towards the infundibulum which predcluded safe dissection of the cystic duct so a subtotal cholecystectomy was done Cholangiogram showed some small CBD stones.  Patient is still a little sleepy from sedation. She denies any other chronic GI problems     Previous GI Evaluation      Recent Labs and Imaging DG CHOLANGIOGRAM  EXISTING TUBE  Result Date: 03/31/2022 INDICATION: History of acute cholecystitis, post ultrasound fluoroscopic guided cholecystostomy tube placement on 02/13/2022. Patient presents to the interventional radiology drain clinic for cholangiogram prior to definitive cholecystectomy scheduled for next Tuesday, 03/2021. She is tolerated the cholecystostomy tube well and is presently without complaint. EXAM: FLUOROSCOPIC GUIDED CHOLECYSTOSTOMY TUBE INJECTION COMPARISON:  Image guided cholecystostomy tube placement-02/13/2022; right upper quadrant abdominal ultrasound-02/12/2022 MEDICATIONS: None ANESTHESIA/SEDATION: None CONTRAST:  20 cc Omnipaque 300, administered into the gallbladder lumen FLUOROSCOPY TIME:  1 minute, 12 seconds (30.2 mGy) COMPLICATIONS: None immediate. PROCEDURE: The patient was positioned supine on the fluoroscopy table. A preprocedural spot fluoroscopic image was obtained of the right upper abdominal quadrant existing cholecystostomy tube. Multiple spot fluoroscopic radiographic images were obtained  of the right upper abdominal quadrant an existing cholecystostomy tube following injection of a small amount of contrast. Images were reviewed and discussed with the patient. The cholecystostomy tube was flushed with  a small amount of saline and capped. A dressing was placed. The patient tolerated the procedure well without immediate postprocedural complication. FINDINGS: Preprocedural spot fluoroscopic image of the right upper abdominal quadrant demonstrates grossly unchanged positioning of the cholecystostomy tube with end coiled and locked overlying the expected location of the gallbladder. Subsequent contrast injection demonstrates appropriate positioning and functionality of the cholecystostomy tube with brisk opacification of the gallbladder. There is passage of contrast from the gallbladder through the cystic and common bile ducts the level duodenum. There are two persistent nonocclusive filling defects within the distal aspect of the CBD worrisome for choledocholithiasis. IMPRESSION: 1. Appropriately positioned and functioning cholecystostomy tube. 2. There are two persistent nonocclusive filling defects within the distal aspect of the CBD worrisome for choledocholithiasis. Further evaluation and management with ERCP could performed as indicated. PLAN: - Given concern for nonocclusive choledocholithiasis and with the patient's impending surgery scheduled for next Tuesday, 03/2021, the cholecystostomy tube was maintained to external drainage and should remain in place until the time of definitive cholecystectomy. Electronically Signed   By: Simonne Come M.D.   On: 03/31/2022 16:26   IR Radiologist Eval & Mgmt  Result Date: 03/31/2022 Please refer to notes tab for details about interventional procedure. (Op Note)   Labs:  Recent Labs    04/08/22 0946  WBC 7.4  HGB 10.8*  HCT 33.2*  PLT 259   No results for input(s): "NA", "K", "CL", "CO2", "GLUCOSE", "BUN", "CREATININE", "CALCIUM" in the last 72 hours. No results for input(s): "PROT", "ALBUMIN", "AST", "ALT", "ALKPHOS", "BILITOT", "BILIDIR", "IBILI" in the last 72 hours. No results for input(s): "HEPBSAG", "HCVAB", "HEPAIGM", "HEPBIGM" in the last 72  hours. No results for input(s): "LABPROT", "INR" in the last 72 hours.  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   History of kidney stones    hx of   History of pneumonia    Joint pain     Past Surgical History:  Procedure Laterality Date   BACK SURGERY     ECTOPIC PREGNANCY SURGERY  1991   IR PERC CHOLECYSTOSTOMY  02/13/2022   IR RADIOLOGIST EVAL & MGMT  03/31/2022   KNEE ARTHROSCOPY WITH MEDIAL MENISECTOMY Right 11/26/2012   Procedure: KNEE ARTHROSCOPY WITH PARTIAL MEDIAL MENISECTOMY,DEBRIDEMENT;  Surgeon: Javier Docker, MD;  Location: [facility];  Service: Orthopedics;  Laterality: Right;   KNEE CLOSED REDUCTION Right 01/27/2017   Procedure: CLOSED MANIPULATION RIGHT KNEE;  Surgeon: Sheral Apley, MD;  Location: [facility];  Service: Orthopedics;  Laterality: Right;   LUMBAR FUSION  03-20-2010   L4 -- L5   TOTAL KNEE ARTHROPLASTY Right 11/11/2016   Procedure: TOTAL KNEE ARTHROPLASTY;  Surgeon: Sheral Apley, MD;  Location: [facility];  Service: Orthopedics;  Laterality: Right;    Family History  Problem Relation Age of Onset   Diabetes Mellitus II Mother    Hypertension Mother     Prior to Admission medications   Medication Sig Start Date End Date Taking? Authorizing Provider  acetaminophen (TYLENOL) 500 MG tablet Take 2 tablets (1,000 mg total) by mouth every 6 (six) hours as needed for mild pain or fever. 02/17/22  Yes Juliet Rude, PA-C  DULoxetine (CYMBALTA)  60 MG capsule Take 60 mg by mouth at bedtime. 01/25/22  Yes [provider]  fluticasone (FLONASE) 50 MCG/ACT nasal spray Place 1-2 sprays into both nostrils daily. Patient taking differently: Place 1-2 sprays into both nostrils daily as needed for allergies. 06/03/20  Yes Wieters, Hallie C, PA-C  gabapentin (NEURONTIN) 600 MG tablet Take 600 mg by mouth 2 (two) times daily. 03/25/22  Yes [provider]   naloxone (NARCAN) nasal spray 4 mg/0.1 mL Place 1 spray into the nose once as needed (opiod overdose). 01/25/22  Yes [provider]  oxyCODONE (ROXICODONE) 15 MG immediate release tablet Take 15 mg by mouth 4 (four) times daily as needed for severe pain. 01/25/22  Yes [provider]  gabapentin (NEURONTIN) 400 MG capsule Take 2 capsules (800 mg total) by mouth 3 (three) times daily. Patient not taking: Reported on 04/07/2022 02/17/22   Johnson, Kelly R, PA-C  pravastatin (PRAVACHOL) 10 MG tablet Take 10 mg by mouth daily. 12/26/21   [provider]  sodium chloride flush 0.9 % SOLN injection Use 5 mLs by Intracatheter route daily. 02/16/22   Han, Aimee H, PA-C    Current Facility-Administered Medications  Medication Dose Route Frequency Provider Last Rate Last Admin   acetaminophen (OFIRMEV) IV 1,000 mg  1,000 mg Intravenous Once PRN Moser, Christopher, MD       acetaminophen (TYLENOL) tablet 1,000 mg  1,000 mg Oral Once PRN Moser, Christopher, MD       Or   acetaminophen (TYLENOL) 160 MG/5ML solution 1,000 mg  1,000 mg Oral Once PRN Moser, Christopher, MD       Chlorhexidine Gluconate Cloth 2 % PADS 6 each  6 each Topical Once Thompson, Burke, MD       And   Chlorhexidine Gluconate Cloth 2 % PADS 6 each  6 each Topical Once Thompson, Burke, MD       feeding supplement (ENSURE PRE-SURGERY) liquid 296 mL  296 mL Oral Once Thompson, Burke, MD       fentaNYL (SUBLIMAZE) 100 MCG/2ML injection            fentaNYL (SUBLIMAZE) injection 25-50 mcg  25-50 mcg Intravenous Q5 min PRN Moser, Christopher, MD   50 mcg at 04/08/22 1400   lactated ringers infusion   Intravenous Continuous Moser, Christopher, MD   Stopped at 04/08/22 1316   oxyCODONE (Oxy IR/ROXICODONE) immediate release tablet 5 mg  5 mg Oral Once PRN Moser, Christopher, MD       Or   oxyCODONE (ROXICODONE) 5 MG/5ML solution 5 mg  5 mg Oral Once PRN Moser, Christopher, MD        Allergies as of 03/24/2022 - Review  Complete 02/13/2022  Allergen Reaction Noted   Bee venom Anaphylaxis 10/08/2015   Aspirin Itching 11/27/2013   Lyrica [pregabalin] Rash 11/11/2016   Morphine and related Itching 11/24/2012    Social History   Socioeconomic History   Marital status: Divorced    Spouse name: Not on file   Number of children: Not on file   Years of education: Not on file   Highest education level: Not on file  Occupational History   Not on file  Tobacco Use   Smoking status: Every Day    Packs/day: 0.25    Years: 34.00    Total pack years: 8.50    Types: Cigarettes   Smokeless tobacco: Never   Tobacco comments:    smokes 4 cigs per day  Vaping Use   Vaping   Use: Never used  Substance and Sexual Activity   Alcohol use: Yes    Comment: socially   Drug use: No   Sexual activity: Not on file  Other Topics Concern   Not on file  Social History Narrative   Not on file   Social Determinants of Health   Financial Resource Strain: Not on file  Food Insecurity: Not on file  Transportation Needs: Not on file  Physical Activity: Not on file  Stress: Not on file  Social Connections: Not on file  Intimate Partner Violence: Not on file    Review of Systems: Unable to obtain. She is sleepy from sedation.  Physical Exam: Vital signs in last 24 hours: Temp:  [97.7 F (36.5 C)-97.9 F (36.6 C)] 97.7 F (36.5 C) (08/22 1330) Pulse Rate:  [67-78] 67 (08/22 1400) Resp:  [17-25] 17 (08/22 1415) BP: (128-140)/(74-87) 128/75 (08/22 1415) SpO2:  [97 %-100 %] 100 % (08/22 1400) Weight:  [83.9 kg] 83.9 kg (08/22 0826)    General:  Sleepy female in NAD Psych:  Pleasant, cooperative.  Eyes: Pupils equal, no icterus. Conjunctive pink Ears:  Normal auditory acuity Nose: No deformity, discharge or lesions Neck:  Supple, no masses felt Lungs:  Clear to auscultation.  Heart:  Regular rate, regular rhythm. No lower extremity edema Abdomen:  Soft, large surgical dressing intact. Drainage bulb with  bloody output.  Rectal :  Deferred Msk: Symmetrical without gross deformities.  Neurologic:  Sleepy,  oriented, grossly normal neurologically Skin:  Intact without significant lesions.    Intake/Output from previous day: No intake/output data recorded. Intake/Output this shift:  Total I/O In: 2200 [I.V.:2100; IV Piggyback:100] Out: 900 [Drains:550; Blood:350]    Principal Problem:   S/P laparoscopic cholecystectomy    Paula Guenther, NP-C @  04/08/2022, 2:51 PM     

## 2022-04-08 NOTE — Anesthesia Procedure Notes (Addendum)

## 2022-04-08 NOTE — Plan of Care (Signed)

## 2022-04-08 NOTE — Transfer of Care (Signed)
Immediate Anesthesia Transfer of Care Note  Patient: Martha Buck  Procedure(s) Performed: LAPAROSCOPIC CHOLECYSTECTOMY (Abdomen)  Patient Location: PACU  Anesthesia Type:General  Level of Consciousness: awake, alert  and oriented  Airway & Oxygen Therapy: Patient Spontanous Breathing and Patient connected to nasal cannula oxygen  Post-op Assessment: Report given to RN, Post -op Vital signs reviewed and stable, Patient moving all extremities X 4 and Patient able to stick tongue midline  Post vital signs: Reviewed  Last Vitals:  Vitals Value Taken Time  BP 172/94 04/08/22 1321  Temp 97.6   Pulse 80 04/08/22 1325  Resp 10 04/08/22 1322  SpO2 99 % 04/08/22 1325  Vitals shown include unvalidated device data.  Last Pain:  Vitals:   04/08/22 0913  TempSrc:   PainSc: 8          Complications: No notable events documented.

## 2022-04-09 ENCOUNTER — Encounter (HOSPITAL_COMMUNITY): Payer: Self-pay | Admitting: General Surgery

## 2022-04-09 ENCOUNTER — Encounter (HOSPITAL_COMMUNITY)
Admission: RE | Disposition: A | Discharge status: Home / Self Care | Payer: Self-pay | Source: Home / Self Care | Attending: General Surgery

## 2022-04-09 ENCOUNTER — Inpatient Hospital Stay (HOSPITAL_COMMUNITY): Payer: Medicare Other | Admitting: Anesthesiology

## 2022-04-09 ENCOUNTER — Inpatient Hospital Stay (HOSPITAL_COMMUNITY): Payer: Medicare Other

## 2022-04-09 DIAGNOSIS — K8051 Calculus of bile duct without cholangitis or cholecystitis with obstruction: Secondary | ICD-10-CM | POA: Diagnosis not present

## 2022-04-09 DIAGNOSIS — K839 Disease of biliary tract, unspecified: Secondary | ICD-10-CM | POA: Diagnosis not present

## 2022-04-09 DIAGNOSIS — K838 Other specified diseases of biliary tract: Secondary | ICD-10-CM

## 2022-04-09 DIAGNOSIS — Z4659 Encounter for fitting and adjustment of other gastrointestinal appliance and device: Secondary | ICD-10-CM

## 2022-04-09 HISTORY — PX: SPHINCTEROTOMY: SHX5279

## 2022-04-09 HISTORY — PX: BILIARY STENT PLACEMENT: SHX5538

## 2022-04-09 HISTORY — PX: ERCP: SHX5425

## 2022-04-09 LAB — CBC
HCT: 27.7 % — ABNORMAL LOW (ref 36.0–46.0)
Hemoglobin: 9.3 g/dL — ABNORMAL LOW (ref 12.0–15.0)
MCH: 32 pg (ref 26.0–34.0)
MCHC: 33.6 g/dL (ref 30.0–36.0)
MCV: 95.2 fL (ref 80.0–100.0)
Platelets: 223 10*3/uL (ref 150–400)
RBC: 2.91 MIL/uL — ABNORMAL LOW (ref 3.87–5.11)
RDW: 13.3 % (ref 11.5–15.5)
WBC: 13.4 10*3/uL — ABNORMAL HIGH (ref 4.0–10.5)
nRBC: 0 % (ref 0.0–0.2)

## 2022-04-09 LAB — COMPREHENSIVE METABOLIC PANEL
ALT: 44 U/L (ref 0–44)
AST: 49 U/L — ABNORMAL HIGH (ref 15–41)
Albumin: 3 g/dL — ABNORMAL LOW (ref 3.5–5.0)
Alkaline Phosphatase: 66 U/L (ref 38–126)
Anion gap: 7 (ref 5–15)
BUN: 10 mg/dL (ref 6–20)
CO2: 23 mmol/L (ref 22–32)
Calcium: 8 mg/dL — ABNORMAL LOW (ref 8.9–10.3)
Chloride: 110 mmol/L (ref 98–111)
Creatinine, Ser: 0.84 mg/dL (ref 0.44–1.00)
GFR, Estimated: >60 mL/min (ref >60)
Glucose, Bld: 115 mg/dL — ABNORMAL HIGH (ref 70–99)
Potassium: 4.3 mmol/L (ref 3.5–5.1)
Sodium: 140 mmol/L (ref 135–145)
Total Bilirubin: 0.6 mg/dL (ref 0.3–1.2)
Total Protein: 5.5 g/dL — ABNORMAL LOW (ref 6.5–8.1)

## 2022-04-09 LAB — SURGICAL PATHOLOGY

## 2022-04-09 SURGERY — ERCP, WITH INTERVENTION IF INDICATED
Anesthesia: General

## 2022-04-09 MED ORDER — MIDAZOLAM HCL 5 MG/5ML IJ SOLN
INTRAMUSCULAR | Status: DC | PRN
  Administered 2022-04-09 (×2): 1 mg via INTRAVENOUS

## 2022-04-09 MED ORDER — LIDOCAINE 2% (20 MG/ML) 5 ML SYRINGE
INTRAMUSCULAR | Status: DC | PRN
  Administered 2022-04-09: 80 mg via INTRAVENOUS

## 2022-04-09 MED ORDER — DICLOFENAC SUPPOSITORY 100 MG
RECTAL | Status: AC
  Filled 2022-04-09: qty 1

## 2022-04-09 MED ORDER — FENTANYL CITRATE (PF) 100 MCG/2ML IJ SOLN
INTRAMUSCULAR | Status: DC | PRN
Start: 2022-04-09 — End: 2022-04-09
  Administered 2022-04-09 (×2): 50 ug via INTRAVENOUS

## 2022-04-09 MED ORDER — ONDANSETRON HCL 4 MG/2ML IJ SOLN
INTRAMUSCULAR | Status: DC | PRN
  Administered 2022-04-09: 4 mg via INTRAVENOUS

## 2022-04-09 MED ORDER — SUGAMMADEX SODIUM 200 MG/2ML IV SOLN
INTRAVENOUS | Status: DC | PRN
  Administered 2022-04-09: 200 mg via INTRAVENOUS

## 2022-04-09 MED ORDER — SODIUM CHLORIDE 0.9 % IV BOLUS
500.0000 mL | Freq: Once | INTRAVENOUS | Status: AC
  Administered 2022-04-09: 500 mL via INTRAVENOUS

## 2022-04-09 MED ORDER — INDOMETHACIN 50 MG RE SUPP
RECTAL | Status: AC
  Filled 2022-04-09: qty 2

## 2022-04-09 MED ORDER — BOOST / RESOURCE BREEZE PO LIQD CUSTOM
1.0000 | Freq: Three times a day (TID) | ORAL | Status: DC
  Administered 2022-04-09 – 2022-04-10 (×2): 1 via ORAL

## 2022-04-09 MED ORDER — TRAMADOL HCL 50 MG PO TABS
50.0000 mg | ORAL_TABLET | Freq: Four times a day (QID) | ORAL | Status: DC
  Administered 2022-04-09 – 2022-04-11 (×7): 50 mg via ORAL
  Filled 2022-04-09 (×7): qty 1

## 2022-04-09 MED ORDER — PROPOFOL 10 MG/ML IV BOLUS
INTRAVENOUS | Status: DC | PRN
  Administered 2022-04-09: 140 mg via INTRAVENOUS

## 2022-04-09 MED ORDER — ROCURONIUM BROMIDE 100 MG/10ML IV SOLN
INTRAVENOUS | Status: DC | PRN
  Administered 2022-04-09: 50 mg via INTRAVENOUS

## 2022-04-09 MED ORDER — DEXAMETHASONE SODIUM PHOSPHATE 4 MG/ML IJ SOLN
INTRAMUSCULAR | Status: DC | PRN
  Administered 2022-04-09: 5 mg via INTRAVENOUS

## 2022-04-09 MED ORDER — PHENYLEPHRINE HCL-NACL 20-0.9 MG/250ML-% IV SOLN
INTRAVENOUS | Status: DC | PRN
  Administered 2022-04-09: 100 ug/min via INTRAVENOUS

## 2022-04-09 MED ORDER — GLUCAGON HCL RDNA (DIAGNOSTIC) 1 MG IJ SOLR
INTRAMUSCULAR | Status: DC | PRN
  Administered 2022-04-09 (×2): .25 mg via INTRAVENOUS

## 2022-04-09 MED ORDER — SODIUM CHLORIDE 0.9 % IV SOLN
INTRAVENOUS | Status: AC
  Filled 2022-04-09: qty 8

## 2022-04-09 MED ORDER — PHENYLEPHRINE 80 MCG/ML (10ML) SYRINGE FOR IV PUSH (FOR BLOOD PRESSURE SUPPORT)
PREFILLED_SYRINGE | INTRAVENOUS | Status: DC | PRN
  Administered 2022-04-09: 160 ug via INTRAVENOUS
  Administered 2022-04-09: 80 ug via INTRAVENOUS
  Administered 2022-04-09: 160 ug via INTRAVENOUS

## 2022-04-09 MED ORDER — ADULT MULTIVITAMIN W/MINERALS CH
1.0000 | ORAL_TABLET | Freq: Every day | ORAL | Status: DC
  Administered 2022-04-10 – 2022-04-11 (×2): 1 via ORAL
  Filled 2022-04-09 (×2): qty 1

## 2022-04-09 MED ORDER — SODIUM CHLORIDE 0.9 % IV SOLN
INTRAVENOUS | Status: DC | PRN
  Administered 2022-04-09: 30 mL

## 2022-04-09 MED ORDER — PHENYLEPHRINE HCL (PRESSORS) 10 MG/ML IV SOLN: INTRAVENOUS | Status: DC | PRN

## 2022-04-09 MED ORDER — GLUCAGON HCL RDNA (DIAGNOSTIC) 1 MG IJ SOLR
INTRAMUSCULAR | Status: AC
  Filled 2022-04-09: qty 1

## 2022-04-09 NOTE — Transfer of Care (Signed)
Immediate Anesthesia Transfer of Care Note  Patient: Martha Buck  Procedure(s) Performed: ENDOSCOPIC RETROGRADE CHOLANGIOPANCREATOGRAPHY (ERCP) BILIARY STENT PLACEMENT  Patient Location: Endoscopy Unit  Anesthesia Type:General  Level of Consciousness: awake  Airway & Oxygen Therapy: Patient Spontanous Breathing and Patient connected to nasal cannula oxygen  Post-op Assessment: Report given to RN and Post -op Vital signs reviewed and stable  Post vital signs: Reviewed and stable  Last Vitals:  Vitals Value Taken Time  BP 116/66 04/09/22 1040  Temp    Pulse 106 04/09/22 1039  Resp 22 04/09/22 1040  SpO2 100 % 04/09/22 1039  Vitals shown include unvalidated device data.  Last Pain:  Vitals:   04/09/22 0908  TempSrc: Temporal  PainSc: 10-Worst pain ever      Patients Stated Pain Goal: 2 (04/08/22 1650)  Complications: No notable events documented.

## 2022-04-09 NOTE — Progress Notes (Signed)
   04/09/22 1535  Clinical Encounter Type  Visited With Patient  Visit Type Initial;Spiritual support  Referral From Nurse  Consult/Referral To Chaplain   Chaplain responded to a spiritual consult request for prayer. I met with the patient Martha Buck, who was appreciative of my visit. She shared that she was still hurting but feeling better in many ways.  She said she prayed before she left the room and again when she went into the surgery room so she was all prayed up. I continued to listen as [name] shared her story. We gave thanks for what she has and I wished her a peaceful night's rest as I departed.   Danice Goltz [facility]  [phone]

## 2022-04-09 NOTE — Progress Notes (Signed)
[name], female   DOB: 29-May-1961, 60 y.o.   MRN: [id number] 1 Day Post-Op    Subjective: Sore ROS negative except as listed above. Objective: Vital signs in last 24 hours: Temp:  [97.6 F (36.4 C)-98.9 F (37.2 C)] 98.9 F (37.2 C) (08/23 0810) Pulse Rate:  [67-110] 105 (08/23 0810) Resp:  [11-25] 18 (08/23 0453) BP: (92-174)/(58-112) 104/68 (08/23 0810) SpO2:  [93 %-100 %] 96 % (08/23 0453) Last BM Date : 04/07/22  Intake/Output from previous day: 08/22 0701 - 08/23 0700 In: 2795.6 [P.O.:120; I.V.:2575.6; IV Piggyback:100] Out: 1225 [Drains:875; Blood:350] Intake/Output this shift: No intake/output data recorded.  General appearance: alert and cooperative Resp: clear to auscultation bilaterally GI: soft, incisions OK, JP bilious as expected, dressings all changed  Lab Results: CBC  Recent Labs    04/08/22 0946 04/09/22 0242  WBC 7.4 13.4*  HGB 10.8* 9.3*  HCT 33.2* 27.7*  PLT 259 223   BMET Recent Labs    04/09/22 0242  NA 140  K 4.3  CL 110  CO2 23  GLUCOSE 115*  BUN 10  CREATININE 0.84  CALCIUM 8.0*   PT/INR No results for input(s): "LABPROT", "INR" in the last 72 hours. ABG No results for input(s): "PHART", "HCO3" in the last 72 hours.  Invalid input(s): "PCO2", "PO2"  Studies/Results: No results found.  Anti-infectives: Anti-infectives (From admission, onward)    Start     Dose/Rate Route Frequency Ordered Stop   04/09/22 0945  ampicillin-sulbactam (UNASYN) 1.5 g in sodium chloride 0.9 % 100 mL IVPB  Status:  Discontinued        1.5 g 200 mL/hr over 30 Minutes Intravenous  Once 04/08/22 1552 04/08/22 1613   04/09/22 0900  ampicillin-sulbactam (UNASYN) 1.5 g in sodium chloride 0.9 % 100 mL IVPB        1.5 g 200 mL/hr over 30 Minutes Intravenous On call 04/08/22 1613 04/10/22 0900   04/08/22 0900  ceFAZolin (ANCEF) IVPB 2g/100 mL premix        2 g 200 mL/hr over 30 Minutes Intravenous On call to O.R. 04/08/22 0847  04/08/22 1051       Assessment/Plan: S/P perc chole tube placement by IR 6/29 S/P cholangiogram via tube 8/14 showing choledocholithiasis S/P laparoscopic LOA, subtotal cholecystectomy 8/22 - JP high volume and bilious as expected - Bili 0.6 - for ERCP today by Dr. Russella Dar. Appreciate his help - NS bolus - ID - Unasyn for ERCP Chronic pain - home meds - add schedlued Ultram after ERCP VTE - LMWH held for ERCP FEN - NPO for ERCP Dispo - above    LOS: 1 day    Violeta Gelinas, MD, MPH, FACS Trauma & General Surgery Use [email or web address] to contact on call provider  04/09/2022

## 2022-04-09 NOTE — Interval H&P Note (Signed)
History and Physical Interval Note:  04/09/2022 9:28 AM  Martha Buck  has presented today for surgery, with the diagnosis of choledocholithiasis.  The various methods of treatment have been discussed with the patient and family. After consideration of risks, benefits and other options for treatment, the patient has consented to  Procedure(s): ENDOSCOPIC RETROGRADE CHOLANGIOPANCREATOGRAPHY (ERCP) (N/A) as a surgical intervention.  The patient's history has been reviewed, patient examined, no change in status, stable for surgery.  I have reviewed the patient's chart and labs.  Questions were answered to the patient's satisfaction.     Venita Lick. Russella Dar

## 2022-04-09 NOTE — Plan of Care (Signed)

## 2022-04-09 NOTE — Op Note (Signed)
[facility] Patient Name: Martha Buck Procedure Date : 04/09/2022 MRN: [id number] Attending MD: Meryl Dare , MD Date of Birth: [birth year] CSN: [id number] Age: 61 Admit Type: Inpatient Procedure:                ERCP Indications:              Bile duct stone(s), Suspected bile leak post                            subtotal cholecystectomy Providers:                Venita Lick. Russella Dar, MD, Vicki Mallet, RN, Joannie Springs, Technician Referring MD:             Violeta Gelinas, MD Medicines:                Propofol per Anesthesia Complications:            No immediate complications. Estimated Blood Loss:     Estimated blood loss: none. Procedure:                Pre-Anesthesia Assessment:                           - Prior to the procedure, a History and Physical                            was performed, and patient medications and                            allergies were reviewed. The patient's tolerance of                            previous anesthesia was also reviewed. The risks                            and benefits of the procedure and the sedation                            options and risks were discussed with the patient.                            All questions were answered, and informed consent                            was obtained. Prior Anticoagulants: The patient has                            taken no previous anticoagulant or antiplatelet                            agents. ASA Grade Assessment: III - A patient with  severe systemic disease. After reviewing the risks                            and benefits, the patient was deemed in                            satisfactory condition to undergo the procedure.                           After obtaining informed consent, the scope was                            passed under direct vision. Throughout the                            procedure, the patient's  blood pressure, pulse, and                            oxygen saturations were monitored continuously. The                            [id number]-[id number] ([id number] Olympus Duodensocope was                            introduced through the mouth, and used to inject                            contrast into and used to cannulate the bile duct.                            The ERCP was accomplished without difficulty. The                            patient tolerated the procedure well. Scope In: Scope Out: Findings:      The scout film was normal. The scope was advanced to a normal major       papilla in the descending duodenum. Limited cxamination of the pharynx,       larynx and associated structures, and upper GI tract appeared normal.       The major papilla was normal. A straight Roadrunner wire was passed into       the biliary tree. The short-nosed traction sphincterotome was passed       over the guidewire and the bile duct was then deeply cannulated.       Contrast was injected. I personally interpreted the bile duct images.       There was appropriate flow of contrast through the ducts. The common       bile duct contained filling defect thought to be a stone. The common       bile duct was diffusely dilated. The largest diameter was 10 mm. A       subtotal cholecystectomy had been performed. Extravasation of contrast       originating from the remnant gallbladder was observed. The intrahepatic       ducts appeared normal. An 8 mm biliary sphincterotomy was made with a  traction (standard) sphincterotome using ERBE electrocautery. There was       no post-sphincterotomy bleeding. The biliary tree was swept with a 12 mm       balloon starting at the bifurcation. Nothing was found. One 10 Fr by 5       cm plastic stent with a single external flap and a single internal flap       was placed 4 cm into the common bile duct. Bile flowed through the       stent. The stent was in good position. The  PD was not cannulated or       injected by intention. Impression:               - A filling defect consistent with a stone or air                            bubble was seen on the cholangiogram.                           - The common bile duct was mildly dilated.                           - A bile leak was found.                           - S/P subtotal cholecystectomy                           - A biliary sphincterotomy was performed.                           - The biliary tree was swept and nothing was found.                           - One plastic stent was placed into the common bile                            duct. Recommendation:           - Return patient to hospital ward for ongoing care.                           - Observe patient's clinical course following                            today's ERCP with therapeutic intervention.                           - Return to endoscopist for stent removal at ERCP                            in 3-4 months. Procedure Code(s):        --- Professional ---                           [phone], Endoscopic retrograde  cholangiopancreatography (ERCP); with placement of                            endoscopic stent into biliary or pancreatic duct,                            including pre- and post-dilation and guide wire                            passage, when performed, including sphincterotomy,                            when performed, each stent Diagnosis Code(s):        --- Professional ---                           K80.51, Calculus of bile duct without cholangitis                            or cholecystitis with obstruction                           K83.9, Disease of biliary tract, unspecified                           Z90.49, Acquired absence of other specified parts                            of digestive tract                           R93.2, Abnormal findings on diagnostic imaging of                            liver  and biliary tract CPT copyright 2019 American [employer]. All rights reserved. The codes documented in this report are preliminary and upon coder review may  be revised to meet current compliance requirements. Meryl Dare, MD 04/09/2022 10:36:39 AM This report has been signed electronically. Number of Addenda: 0

## 2022-04-09 NOTE — Plan of Care (Signed)

## 2022-04-09 NOTE — Anesthesia Preprocedure Evaluation (Addendum)
Anesthesia Evaluation  Patient identified by MRN, date of birth, ID band Patient awake    Reviewed: Allergy & Precautions, NPO status , Patient's Chart, lab work & pertinent test results  History of Anesthesia Complications Negative for: history of anesthetic complications  Airway Mallampati: II  TM Distance: >3 FB Neck ROM: Full    Dental  (+) Dental Advisory Given   Pulmonary neg shortness of breath, pneumonia, neg COPD, Current Smoker and Patient abstained from smoking.,    Pulmonary exam normal breath sounds clear to auscultation       Cardiovascular Normal cardiovascular exam Rhythm:Regular Rate:Normal  Echo 06/2020 1. Left ventricular ejection fraction, by estimation, is 50 to 55%. The left ventricle has low normal function. The left ventricle has no regional wall motion abnormalities. There is moderate asymmetric left ventricular hypertrophy of the septal segment. Left ventricular diastolic parameters are consistent with Grade I diastolic dysfunction (impaired relaxation). There is incoordinate septal motion.  2. Right ventricular systolic function is normal. The right ventricular size is normal. There is normal pulmonary artery systolic pressure.  3. The mitral valve is grossly normal. Trivial mitral valve  regurgitation.  4. The aortic valve is tricuspid. Aortic valve regurgitation is not visualized.  5. The inferior vena cava is normal in size with greater than 50% respiratory variability, suggesting right atrial pressure of 3 mmHg   Neuro/Psych PSYCHIATRIC DISORDERS Depression negative neurological ROS     GI/Hepatic Neg liver ROS, CHOLECYSTITIS   Endo/Other  No results found for: "HGBA1C"   Renal/GU negative Renal ROS     Musculoskeletal  (+) Arthritis ,   Abdominal   Peds  Hematology  (+) Blood dyscrasia, anemia , Lab Results      Component                Value               Date                       WBC                      7.4                 04/08/2022                HGB                      10.8 (L)            04/08/2022                HCT                      33.2 (L)            04/08/2022                MCV                      97.4                04/08/2022                PLT                      259  04/08/2022              Anesthesia Other Findings   Reproductive/Obstetrics                            Anesthesia Physical  Anesthesia Plan  ASA: 2  Anesthesia Plan: General   Post-op Pain Management: Tylenol PO (pre-op)*   Induction: Intravenous  PONV Risk Score and Plan: 2 and Ondansetron and Dexamethasone  Airway Management Planned: Oral ETT  Additional Equipment: None  Intra-op Plan:   Post-operative Plan: Extubation in OR  Informed Consent: I have reviewed the patients History and Physical, chart, labs and discussed the procedure including the risks, benefits and alternatives for the proposed anesthesia with the patient or authorized representative who has indicated his/her understanding and acceptance.     Dental advisory given  Plan Discussed with: CRNA  Anesthesia Plan Comments:        Anesthesia Quick Evaluation

## 2022-04-09 NOTE — Progress Notes (Signed)
   04/09/22 1440  Clinical Encounter Type  Visited With Patient not available  Visit Type Initial;Spiritual support  Referral From Nurse  Consult/Referral To Chaplain   Chaplain responded to a spiritual consult for prayer. I attempted to visit with the patient, Martha Buck this morning and again this afternoon. She is receiving care at this time. I will continue to try and meet with Britta Mccreedy.   Valerie Roys [facility]  [phone]

## 2022-04-09 NOTE — Progress Notes (Signed)
Initial Nutrition Assessment  DOCUMENTATION CODES:   Not applicable  INTERVENTION:  Encourage adequate PO intake Boost Breeze po TID, each supplement provides 250 kcal and 9 grams of protein MVI with minerals daily  NUTRITION DIAGNOSIS:   Increased nutrient needs related to post-op healing as evidenced by estimated needs.  GOAL:   Patient will meet greater than or equal to 90% of their needs  MONITOR:   PO intake, Supplement acceptance, Diet advancement, Labs, Weight trends, Skin  REASON FOR ASSESSMENT:   Malnutrition Screening Tool    ASSESSMENT:   Pt previously hospitalized for severe acute cholecystitis s/p percutaneous cholecystostomy tube on 6/29. Now presents for laparoscopic cholecystectomy with cholangiogram.   Patient drowsy at time of visit as she is s/p ERCP today. She states that Since Friday, she has not eaten much as she was eating Ramen Noodles and was told not to eat these as they contained a lot of sodium. She states that after this she questioned all of the foods she was eating and stayed away from food. She recalls generally having great PO intake and enjoys eating collard greens, fried meats and oxtails.   She suspects that since all of the complications with her gallbladder began she has had weight loss. Reports a usual weight over 200 lbs. Now suspects she is about 180 lbs. Reviewed weight history. It appears she has had a 13.6% weight loss between 6/28-8/23 which is clinically significant for time frame.   Briefly spoke with patient about eating a low fat diet to help with digestion post-operatively as well as eating a balanced diet of fresh fruits and vegetables, baked/broiled/grilled meats, and whole grains for general health and well-being.   Medications reviewed and include IV NaCl with KCl @ 40ml/hr and abx  Labs: reviewed  R JP drain: x24 hours I/O's: +2032ml since admission  NUTRITION - FOCUSED PHYSICAL EXAM:  Flowsheet Row Most  Recent Value  Orbital Region No depletion  Upper Arm Region No depletion  Thoracic and Lumbar Region No depletion  Buccal Region No depletion  Temple Region Mild depletion  Clavicle Bone Region No depletion  Clavicle and Acromion Bone Region No depletion  Scapular Bone Region No depletion  Dorsal Hand Mild depletion  Patellar Region Mild depletion  Anterior Thigh Region Mild depletion  Posterior Calf Region No depletion  Edema (RD Assessment) None  Hair Reviewed  Eyes Reviewed  Mouth Reviewed  Skin Reviewed  Nails Reviewed       Diet Order:   Diet Order             Diet clear liquid Room service appropriate? Yes; Fluid consistency: Thin  Diet effective now                   EDUCATION NEEDS:   Education needs have been addressed  Skin:  Skin Assessment: Skin Integrity Issues: Skin Integrity Issues:: Incisions Incisions: abdomen (closed)  Last BM:  8/21  Height:   Ht Readings from Last 1 Encounters:  04/09/22 5\' 9"  (1.753 m)    Weight:   Wt Readings from Last 1 Encounters:  04/09/22 83.9 kg    Ideal Body Weight:     BMI:  Body mass index is 27.31 kg/m.  Estimated Nutritional Needs:   Kcal:  2000-2200  Protein:  105-120g  Fluid:  >/=2L   04/11/22, RDN, LDN Clinical Nutrition

## 2022-04-09 NOTE — Anesthesia Procedure Notes (Signed)
Procedure Name: Intubation Date/Time: 04/09/2022 9:40 AM  Performed by: Caren Macadam, CRNAPre-anesthesia Checklist: Patient identified, Emergency Drugs available, Suction available and Patient being monitored Patient Re-evaluated:Patient Re-evaluated prior to induction Oxygen Delivery Method: Circle system utilized Preoxygenation: Pre-oxygenation with 100% oxygen Induction Type: IV induction Ventilation: Mask ventilation without difficulty Laryngoscope Size: Miller and 2 Grade View: Grade I Tube type: Oral Tube size: 7.0 mm Number of attempts: 1 Airway Equipment and Method: Stylet Placement Confirmation: ETT inserted through vocal cords under direct vision, positive ETCO2 and breath sounds checked- equal and bilateral Secured at: 22 cm Tube secured with: Tape Dental Injury: Teeth and Oropharynx as per pre-operative assessment

## 2022-04-09 NOTE — Anesthesia Postprocedure Evaluation (Signed)
Anesthesia Post Note  Patient: Martha Buck  Procedure(s) Performed: ENDOSCOPIC RETROGRADE CHOLANGIOPANCREATOGRAPHY (ERCP) BILIARY STENT PLACEMENT     Patient location during evaluation: PACU Anesthesia Type: General Level of consciousness: sedated and patient cooperative Pain management: pain level controlled Vital Signs Assessment: post-procedure vital signs reviewed and stable Respiratory status: spontaneous breathing Cardiovascular status: stable Anesthetic complications: no   No notable events documented.  Last Vitals:  Vitals:   04/09/22 1110 04/09/22 1145  BP: 105/72 99/66  Pulse: (!) 104 91  Resp: 16 20  Temp:  37.1 C  SpO2: 100% 98%    Last Pain:  Vitals:   04/09/22 1039  TempSrc: Temporal  PainSc: 7                  Lewie Loron

## 2022-04-10 ENCOUNTER — Telehealth: Payer: Self-pay

## 2022-04-10 ENCOUNTER — Encounter (HOSPITAL_COMMUNITY): Payer: Self-pay | Admitting: Gastroenterology

## 2022-04-10 LAB — COMPREHENSIVE METABOLIC PANEL
ALT: 33 U/L (ref 0–44)
AST: 25 U/L (ref 15–41)
Albumin: 2.6 g/dL — ABNORMAL LOW (ref 3.5–5.0)
Alkaline Phosphatase: 61 U/L (ref 38–126)
Anion gap: 6 (ref 5–15)
BUN: 9 mg/dL (ref 6–20)
CO2: 21 mmol/L — ABNORMAL LOW (ref 22–32)
Calcium: 7.9 mg/dL — ABNORMAL LOW (ref 8.9–10.3)
Chloride: 113 mmol/L — ABNORMAL HIGH (ref 98–111)
Creatinine, Ser: 0.67 mg/dL (ref 0.44–1.00)
GFR, Estimated: >60 mL/min (ref >60)
Glucose, Bld: 119 mg/dL — ABNORMAL HIGH (ref 70–99)
Potassium: 3.8 mmol/L (ref 3.5–5.1)
Sodium: 140 mmol/L (ref 135–145)
Total Bilirubin: 0.5 mg/dL (ref 0.3–1.2)
Total Protein: 5.3 g/dL — ABNORMAL LOW (ref 6.5–8.1)

## 2022-04-10 LAB — CBC
HCT: 24 % — ABNORMAL LOW (ref 36.0–46.0)
Hemoglobin: 7.9 g/dL — ABNORMAL LOW (ref 12.0–15.0)
MCH: 31.9 pg (ref 26.0–34.0)
MCHC: 32.9 g/dL (ref 30.0–36.0)
MCV: 96.8 fL (ref 80.0–100.0)
Platelets: 178 10*3/uL (ref 150–400)
RBC: 2.48 MIL/uL — ABNORMAL LOW (ref 3.87–5.11)
RDW: 13.8 % (ref 11.5–15.5)
WBC: 15 10*3/uL — ABNORMAL HIGH (ref 4.0–10.5)
nRBC: 0 % (ref 0.0–0.2)

## 2022-04-10 MED ORDER — ENOXAPARIN SODIUM 40 MG/0.4ML IJ SOSY
40.0000 mg | PREFILLED_SYRINGE | INTRAMUSCULAR | Status: DC
  Administered 2022-04-10: 40 mg via SUBCUTANEOUS
  Filled 2022-04-10: qty 0.4

## 2022-04-10 NOTE — Progress Notes (Addendum)
    Progress Note   Assessment    Cholecystitis, cholelithiasis S/P perc drain in June. Choledocholithiasis on 8/14 cholangiogram: 2 CBD filling defects c/w stones. S/P laparoscopic subtotal cholecystectomy, LOA and JP drain placement on 8/22. Bile leak resolving post ERCP sphincterotomy, balloon CBD pull through and biliary stent placement yesterday.    Recommendations   My office will arrange repeat ERCP with stent removal in 3 months. We are available if needed. GI signing off.    Chief Complaint   Abdomen is sore. JP drainage is serosanguinous.   Vital signs in last 24 hours: Temp:  [97.4 F (36.3 C)-98.8 F (37.1 C)] 97.8 F (36.6 C) (08/23 2100) Pulse Rate:  [91-108] 93 (08/24 0445) Resp:  [15-25] 16 (08/24 0445) BP: (96-116)/(60-74) 114/73 (08/24 0445) SpO2:  [89 %-100 %] 94 % (08/24 0445) Weight:  [83.9 kg] 83.9 kg (08/23 1039) Last BM Date : 04/08/22  General: Alert, well-developed, in NAD Heart:  Regular rate and rhythm; no murmurs Chest: Clear to ascultation bilaterally Abdomen:  Soft, RUQ tenderness and nondistended. JP drain. Normal bowel sounds, without guarding, and without rebound.   Extremities:  Without edema. Neurologic:  Alert and  oriented x4; grossly normal neurologically. Psych:  Alert and cooperative. Normal mood and affect.  Intake/Output from previous day: 08/23 0701 - 08/24 0700 In: 1828 [P.O.:480; I.V.:1348] Out: 200 [Drains:200] Intake/Output this shift: No intake/output data recorded.  Lab Results: Recent Labs    04/08/22 0946 04/09/22 0242 04/10/22 0216  WBC 7.4 13.4* 15.0*  HGB 10.8* 9.3* 7.9*  HCT 33.2* 27.7* 24.0*  PLT 259 223 178   BMET Recent Labs    04/09/22 0242 04/10/22 0216  NA 140 140  K 4.3 3.8  CL 110 113*  CO2 23 21*  GLUCOSE 115* 119*  BUN 10 9  CREATININE 0.84 0.67  CALCIUM 8.0* 7.9*   LFT Recent Labs    04/10/22 0216  PROT 5.3*  ALBUMIN 2.6*  AST 25  ALT 33  ALKPHOS 61  BILITOT 0.5     Studies/Results: DG ERCP  Result Date: 04/09/2022 CLINICAL DATA:  Possible choledocholithiasis noted on cholecystostomy tube injection. Interval laparoscopic subtotal cholecystectomy. EXAM: ERCP TECHNIQUE: Multiple spot images obtained with the fluoroscopic device and submitted for interpretation post-procedure. COMPARISON:  Cholangiogram through existing catheter 03/31/2022 FINDINGS: A series of fluoroscopic spot images document endoscopic cannulation and opacification of the CBD. Filling defect in the mid CBD suggested on early images. There is subsequent balloon catheter passage through the CBD. There is incomplete opacification of the intrahepatic biliary tree, which appears decompressed. A small amount of contrast accumulation is noted in the region of the gallbladder fossa. IMPRESSION: Endoscopic CBD cannulation and intervention as above. These images were submitted for radiologic interpretation only. Please see the procedural report for the amount of contrast and the fluoroscopy time utilized. Electronically Signed   By: Corlis Leak M.D.   On: 04/09/2022 10:43      LOS: 2 days   Judie Petit T. Russella Dar, MD 04/10/2022, 8:52 AM See [facility], to contact our on call provider

## 2022-04-10 NOTE — Progress Notes (Addendum)
[name], female   DOB: [birth year], 61 y.o.   MRN: [id number] 1 Day Post-Op    Subjective: Sore, taking CL OK ROS negative except as listed above. Objective: Vital signs in last 24 hours: Temp:  [97.4 F (36.3 C)-98.9 F (37.2 C)] 97.8 F (36.6 C) (08/23 2100) Pulse Rate:  [91-108] 93 (08/24 0445) Resp:  [15-25] 16 (08/24 0445) BP: (96-116)/(60-74) 114/73 (08/24 0445) SpO2:  [89 %-100 %] 94 % (08/24 0445) Weight:  [83.9 kg] 83.9 kg (08/23 1039) Last BM Date : 04/08/22  Intake/Output from previous day: 08/23 0701 - 08/24 0700 In: 1828 [P.O.:480; I.V.:1348] Out: 200 [Drains:200] Intake/Output this shift: No intake/output data recorded.  General appearance: alert and cooperative Resp: clear to auscultation bilaterally Cardio: regular rate and rhythm GI: soft, incisions OK, JP more SS now  Lab Results: CBC  Recent Labs    04/09/22 0242 04/10/22 0216  WBC 13.4* 15.0*  HGB 9.3* 7.9*  HCT 27.7* 24.0*  PLT 223 178   BMET Recent Labs    04/09/22 0242 04/10/22 0216  NA 140 140  K 4.3 3.8  CL 110 113*  CO2 23 21*  GLUCOSE 115* 119*  BUN 10 9  CREATININE 0.84 0.67  CALCIUM 8.0* 7.9*   PT/INR No results for input(s): "LABPROT", "INR" in the last 72 hours. ABG No results for input(s): "PHART", "HCO3" in the last 72 hours.  Invalid input(s): "PCO2", "PO2"  Studies/Results: DG ERCP  Result Date: 04/09/2022 CLINICAL DATA:  Possible choledocholithiasis noted on cholecystostomy tube injection. Interval laparoscopic subtotal cholecystectomy. EXAM: ERCP TECHNIQUE: Multiple spot images obtained with the fluoroscopic device and submitted for interpretation post-procedure. COMPARISON:  Cholangiogram through existing catheter 03/31/2022 FINDINGS: A series of fluoroscopic spot images document endoscopic cannulation and opacification of the CBD. Filling defect in the mid CBD suggested on early images. There is subsequent balloon catheter passage through the  CBD. There is incomplete opacification of the intrahepatic biliary tree, which appears decompressed. A small amount of contrast accumulation is noted in the region of the gallbladder fossa. IMPRESSION: Endoscopic CBD cannulation and intervention as above. These images were submitted for radiologic interpretation only. Please see the procedural report for the amount of contrast and the fluoroscopy time utilized. Electronically Signed   By: Corlis Leak M.D.   On: 04/09/2022 10:43    Anti-infectives: Anti-infectives (From admission, onward)    Start     Dose/Rate Route Frequency Ordered Stop   04/09/22 0945  ampicillin-sulbactam (UNASYN) 1.5 g in sodium chloride 0.9 % 100 mL IVPB  Status:  Discontinued        1.5 g 200 mL/hr over 30 Minutes Intravenous  Once 04/08/22 1552 04/08/22 1613   04/09/22 0900  ampicillin-sulbactam (UNASYN) 1.5 g in sodium chloride 0.9 % 100 mL IVPB        1.5 g 200 mL/hr over 30 Minutes Intravenous On call 04/08/22 1613 04/10/22 0900   04/08/22 0900  ceFAZolin (ANCEF) IVPB 2g/100 mL premix        2 g 200 mL/hr over 30 Minutes Intravenous On call to O.R. 04/08/22 0847 04/08/22 1051       Assessment/Plan: S/P perc chole tube placement by IR 6/29 S/P cholangiogram via tube 8/14 showing choledocholithiasis S/P laparoscopic LOA, subtotal cholecystectomy 8/22 - S/P ERCP/stent by Dr. Russella Dar 8/23 - appreciated - JP now more SS Deconditioning - PT eval Chronic pain - home meds - added schedlued Ultram ABL anemia VTE - LMWH  FEN - CL now,  reg for dinner if does well, D/C IVF Dispo - above  LOS: 2 days    Violeta Gelinas, MD, MPH, FACS Trauma & General Surgery Use [email or web address] to contact on call provider  04/10/2022

## 2022-04-10 NOTE — Anesthesia Postprocedure Evaluation (Signed)
Anesthesia Post Note  Patient: Martha Buck  Procedure(s) Performed: LAPAROSCOPIC CHOLECYSTECTOMY (Abdomen)     Patient location during evaluation: PACU Anesthesia Type: General Level of consciousness: awake and alert Pain management: pain level controlled Vital Signs Assessment: post-procedure vital signs reviewed and stable Respiratory status: spontaneous breathing, nonlabored ventilation, respiratory function stable and patient connected to nasal cannula oxygen Cardiovascular status: blood pressure returned to baseline and stable Postop Assessment: no apparent nausea or vomiting Anesthetic complications: no   No notable events documented.            Quince Santana

## 2022-04-10 NOTE — Evaluation (Signed)
Physical Therapy Evaluation Patient Details Name: Martha Buck MRN: [id number] DOB: 05-01-1961 Today's Date: 04/10/2022  History of Present Illness  61 y/o female admitted on 04/08/22 for laparoscopic subtotal cholecystectomy. S/p ERCP on 8/23. Recent hospitalization 6/28-7/3 for severe acute cholecystitis with percutaneous cholecystectomy tube on 6/29. No significant PMH.  Clinical Impression  Patient admitted with the above. PTA, patient lives with daughter and has PCA 7 days/week for 3 hours/day to assist with bathing as needed and cleaning. She ambulates with RW at all times due to hx of R TKA limiting knee flexion/extension to ~70 degrees at best. Patient currently presents with weakness, impaired balance, and decreased activity tolerance. Patient functioning at min guard level for OOB mobility with use of RW. Encouraged continued mobility with nursing staff. Patient will benefit from skilled PT services during acute stay to address listed deficits.e Recommend HHPT at discharge to maximize functional mobility and safety.        Recommendations for follow up therapy are one component of a multi-disciplinary discharge planning process, led by the attending physician.  Recommendations may be updated based on patient status, additional functional criteria and insurance authorization.  Follow Up Recommendations Home health PT      Assistance Recommended at Discharge PRN  Patient can return home with the following       Equipment Recommendations None recommended by PT  Recommendations for Other Services       Functional Status Assessment Patient has had a recent decline in their functional status and demonstrates the ability to make significant improvements in function in a reasonable and predictable amount of time.     Precautions / Restrictions Precautions Precautions: Fall Precaution Comments: JP drain on R side      Mobility  Bed Mobility Overal bed mobility: Modified  Independent                  Transfers Overall transfer level: Needs assistance Equipment used: Rolling Autum Benfer (2 wheels) Transfers: Sit to/from Stand Sit to Stand: Min guard                Ambulation/Gait Ambulation/Gait assistance: Min guard Gait Distance (Feet): 110 Feet (+15) Assistive device: Rolling Isais Klipfel (2 wheels) Gait Pattern/deviations: Step-to pattern, Decreased stride length, Knee flexed in stance - right Gait velocity: decreased     General Gait Details: min guard for safety. R knee flexed during stance with patient ambulating on toes on R  Stairs            Wheelchair Mobility    Modified Rankin (Stroke Patients Only)       Balance Overall balance assessment: Needs assistance Sitting-balance support: No upper extremity supported, Feet supported Sitting balance-Leahy Scale: Good     Standing balance support: Bilateral upper extremity supported, Reliant on assistive device for balance Standing balance-Leahy Scale: Poor Standing balance comment: reliant on RW for support                             Pertinent Vitals/Pain Pain Assessment Pain Assessment: Faces Faces Pain Scale: Hurts even more Pain Location: R flank Pain Descriptors / Indicators: Discomfort, Guarding Pain Intervention(s): Monitored during session, Repositioned    Home Living Family/patient expects to be discharged to:: Private residence Living Arrangements: Children Available Help at Discharge: Personal care attendant;Family Type of Home: House Home Access: Stairs to enter Entrance Stairs-Rails: Right Entrance Stairs-Number of Steps: 3   Home Layout: One level Home Equipment: Rolling  Lisabeth Mian (2 wheels) Additional Comments: PCA comes 7 days/week for 3 hours/day    Prior Function Prior Level of Function : Needs assist             Mobility Comments: uses RW due to hx of R knee replacement and unable to flex or extend R LE ADLs Comments: aide  assists with bathing as needed per patient     Hand Dominance        Extremity/Trunk Assessment   Upper Extremity Assessment Upper Extremity Assessment: Overall WFL for tasks assessed    Lower Extremity Assessment Lower Extremity Assessment: RLE deficits/detail RLE Deficits / Details: hx of R TKA - unable to flex/extend past ~70 degrees    Cervical / Trunk Assessment Cervical / Trunk Assessment: Kyphotic  Communication   Communication: No difficulties  Cognition Arousal/Alertness: Awake/alert Behavior During Therapy: WFL for tasks assessed/performed Overall Cognitive Status: Within Functional Limits for tasks assessed                                          General Comments      Exercises     Assessment/Plan    PT Assessment Patient needs continued PT services  PT Problem List Decreased strength;Decreased activity tolerance;Decreased balance;Decreased range of motion;Decreased mobility;Decreased safety awareness       PT Treatment Interventions DME instruction;Gait training;Functional mobility training;Therapeutic activities;Therapeutic exercise;Balance training;Stair training;Patient/family education    PT Goals (Current goals can be found in the Care Plan section)  Acute Rehab PT Goals Patient Stated Goal: to go home and get better PT Goal Formulation: With patient Time For Goal Achievement: 04/24/22 Potential to Achieve Goals: Good    Frequency Min 3X/week     Co-evaluation               AM-PAC PT "6 Clicks" Mobility  Outcome Measure Help needed turning from your back to your side while in a flat bed without using bedrails?: None Help needed moving from lying on your back to sitting on the side of a flat bed without using bedrails?: None Help needed moving to and from a bed to a chair (including a wheelchair)?: A Little Help needed standing up from a chair using your arms (e.g., wheelchair or bedside chair)?: A Little Help  needed to walk in hospital room?: A Little Help needed climbing 3-5 steps with a railing? : A Little 6 Click Score: 20    End of Session   Activity Tolerance: Patient tolerated treatment well Patient left: in bed;with call bell/phone within reach Nurse Communication: Mobility status PT Visit Diagnosis: Unsteadiness on feet (R26.81);Muscle weakness (generalized) (M62.81);Difficulty in walking, not elsewhere classified (R26.2)    Time: 9518-8416 PT Time Calculation (min) (ACUTE ONLY): 19 min   Charges:   PT Evaluation $PT Eval Low Complexity: 1 Low          Datrell Dunton A. Dan Humphreys PT, DPT Acute Rehabilitation Services Office [phone]   Viviann Spare 04/10/2022, 5:08 PM

## 2022-04-10 NOTE — Telephone Encounter (Signed)
-----   Message from Meryl Dare, MD sent at 04/10/2022  7:29 AM EDT ----- Hospital patient post ERCP biliary stent for a bile leak. She needs an outpatient ERCP w/ stent removal in 3-4 months.

## 2022-04-10 NOTE — Plan of Care (Signed)
  Problem: Activity: Goal: Risk for activity intolerance will decrease Outcome: Progressing   

## 2022-04-10 NOTE — Plan of Care (Signed)
  Problem: Clinical Measurements: Goal: Ability to maintain clinical measurements within normal limits will improve Outcome: Progressing Goal: Respiratory complications will improve Outcome: Progressing   Problem: Activity: Goal: Risk for activity intolerance will decrease Outcome: Progressing   Problem: Elimination: Goal: Will not experience complications related to bowel motility Outcome: Progressing   Problem: Clinical Measurements: Goal: Ability to maintain clinical measurements within normal limits will improve Outcome: Progressing Goal: Diagnostic test results will improve Outcome: Progressing Goal: Respiratory complications will improve 04/10/2022 1851 by Hampton Abbot, RN Outcome: Progressing 04/10/2022 1851 by Hampton Abbot, RN Outcome: Progressing   Problem: Activity: Goal: Risk for activity intolerance will decrease 04/10/2022 1851 by Hampton Abbot, RN Outcome: Progressing 04/10/2022 1851 by Hampton Abbot, RN Outcome: Progressing   Problem: Elimination: Goal: Will not experience complications related to bowel motility 04/10/2022 1851 by Hampton Abbot, RN Outcome: Progressing 04/10/2022 1851 by Hampton Abbot, RN Outcome: Progressing

## 2022-04-11 ENCOUNTER — Other Ambulatory Visit (HOSPITAL_COMMUNITY): Payer: Self-pay

## 2022-04-11 ENCOUNTER — Other Ambulatory Visit: Payer: Self-pay

## 2022-04-11 DIAGNOSIS — K838 Other specified diseases of biliary tract: Secondary | ICD-10-CM

## 2022-04-11 LAB — COMPREHENSIVE METABOLIC PANEL
ALT: 26 U/L (ref 0–44)
AST: 17 U/L (ref 15–41)
Albumin: 2.7 g/dL — ABNORMAL LOW (ref 3.5–5.0)
Alkaline Phosphatase: 78 U/L (ref 38–126)
Anion gap: 7 (ref 5–15)
BUN: 8 mg/dL (ref 6–20)
CO2: 25 mmol/L (ref 22–32)
Calcium: 8.1 mg/dL — ABNORMAL LOW (ref 8.9–10.3)
Chloride: 108 mmol/L (ref 98–111)
Creatinine, Ser: 0.78 mg/dL (ref 0.44–1.00)
GFR, Estimated: >60 mL/min (ref >60)
Glucose, Bld: 114 mg/dL — ABNORMAL HIGH (ref 70–99)
Potassium: 3.3 mmol/L — ABNORMAL LOW (ref 3.5–5.1)
Sodium: 140 mmol/L (ref 135–145)
Total Bilirubin: 0.6 mg/dL (ref 0.3–1.2)
Total Protein: 5.8 g/dL — ABNORMAL LOW (ref 6.5–8.1)

## 2022-04-11 LAB — CBC
HCT: 26.8 % — ABNORMAL LOW (ref 36.0–46.0)
Hemoglobin: 9.1 g/dL — ABNORMAL LOW (ref 12.0–15.0)
MCH: 32 pg (ref 26.0–34.0)
MCHC: 34 g/dL (ref 30.0–36.0)
MCV: 94.4 fL (ref 80.0–100.0)
Platelets: 209 10*3/uL (ref 150–400)
RBC: 2.84 MIL/uL — ABNORMAL LOW (ref 3.87–5.11)
RDW: 13.5 % (ref 11.5–15.5)
WBC: 15 10*3/uL — ABNORMAL HIGH (ref 4.0–10.5)
nRBC: 0 % (ref 0.0–0.2)

## 2022-04-11 MED ORDER — TRAMADOL HCL 50 MG PO TABS
50.0000 mg | ORAL_TABLET | Freq: Four times a day (QID) | ORAL | 0 refills | Status: DC | PRN
  Filled 2022-04-11: qty 30, 8d supply, fill #0

## 2022-04-11 NOTE — Telephone Encounter (Signed)
Patient has been scheduled for ERCP w/stent removal on 06/26/22 at 9:15 am at [facility] with Dr. Russella Dar. Hospital orders placed. Patient is currently admitted. Will contact her once she is home & will set up PV.

## 2022-04-11 NOTE — TOC Transition Note (Signed)
Transition of Care [facility]) - CM/SW Discharge Note   Patient Details  Name: Martha Buck MRN: [id number] Date of Birth: November 28, 1960  Transition of Care [facility]) CM/SW Contact:  Epifanio Lesches, RN Phone Number: 04/11/2022, 9:54 AM   Clinical Narrative:    Patient will DC to: home Anticipated DC date: 04/11/2022 Family notified: yes Transport by: car  Status post laparoscopic lysis of adhesions 8/22 and subtotal cholecystectomy, status post ERCP with stent placement 8/23  Per MD patient ready for DC today. RN, patient,  and patient's family notified of DC. Pt from home with nurse aide assistance. Post hospital f/u noted on AVS. Pt without Rx med concerns. Pt without DME needs, has RW. Family to provide transportation to home.  RNCM will sign off for now as intervention is no longer needed. Please consult [place] again if new needs arise.    Final next level of care: Home/Self Care (declined HHPT per PT's recommendation) Barriers to Discharge: No Barriers Identified   Patient Goals and CMS Choice        Discharge Placement                       Discharge Plan and Services                                     Social Determinants of Health (SDOH) Interventions     Readmission Risk Interventions     No data to display

## 2022-04-11 NOTE — Care Management Important Message (Signed)
Important Message  Patient Details  Name: Martha Buck MRN: [id number] Date of Birth: 1961/07/21   Medicare Important Message Given:  Yes     Sherilyn Banker 04/11/2022, 12:11 PM

## 2022-04-11 NOTE — Discharge Summary (Signed)
Physician Discharge Summary  [name] MRN: [id number] DOB/AGE: 1960/11/28 61 y.o.  Admit date: 04/08/2022 Discharge date: 04/11/2022  Admission Diagnoses: Cholecystitis, percutaneous cholecystostomy tube in place  Discharge Diagnoses: Status post laparoscopic lysis of adhesions and subtotal cholecystectomy, status post ERCP with stent placement    Discharged Condition: stable  Hospital Course: Patient underwent laparoscopic lysis adhesions and laparoscopic subtotal cholecystectomy.  JP drain was left in place.  Her previous cholangiogram via her percutaneous cholecystostomy tube showed common bile duct stones.  In light of the subtotal cholecystectomy as well as the common bile duct stones, she underwent ERCP by Dr. Fuller Plan on postoperative day 1.  She did very well and tolerated advancement of her diet.  Pain was well controlled.  Her JP drain output began tapering after the ERCP with only 60 cc out the last 24 hours.  She cleared physical therapy and is discharged home in stable condition.  Consults: GI  Significant Diagnostic Studies: labs -LFTs normal  Treatments: Surgery and ERCP  Discharge Exam: Blood pressure (!) 156/97, pulse 96, temperature 98.6 F (37 C), temperature source Oral, resp. rate 20, height $RemoveBe'5\' 9"'kksmfVMwj$  (1.753 m), weight 83.9 kg, last menstrual period 04/26/2012, SpO2 100 %. General appearance: cooperative Resp: clear to auscultation bilaterally Cardio: regular rate and rhythm GI: Soft, drain bile tinged serosanguineous, incisions otherwise clean dry and intact  Disposition: Discharge disposition: 01-Home or Self Care       Discharge Instructions     Call MD for:  redness, tenderness, or signs of infection (pain, swelling, redness, odor or green/yellow discharge around incision site)   Complete by: As directed    Diet - low sodium heart healthy   Complete by: As directed    Discharge instructions   Complete by: As directed    [facility]  ______[facility] LAP[facility]OS[facility]: POST OP INSTRUCTIONS Always review your discharge instruction sheet given to you by the facility where your surgery was performed. IF YOU HAVE DISABILITY OR FAMILY LEAVE FORMS, YOU MUST BRING THEM TO THE OFFICE FOR PROCESSING.   DO NOT GIVE THEM TO YOUR DOCTOR.  A prescription for pain medication may be given to you upon discharge.  Take your pain medication as prescribed, if needed.  If narcotic pain medicine is not needed, then you may take acetaminophen (Tylenol) or ibuprofen (Advil) as needed. Take your usually prescribed medications unless otherwise directed. If you need a refill on your pain medication, please contact your pharmacy.  They will contact our office to request authorization. Prescriptions will not be filled after 5pm or on week-ends. You should follow a light diet the first few days after arrival home, such as soup and crackers, etc.  Be sure to include lots of fluids daily. Most patients will experience some swelling and bruising in the area of the incisions.  Ice packs will help.  Swelling and bruising can take several days to resolve.  It is common to experience some constipation if taking pain medication after surgery.  Increasing fluid intake and taking a stool softener (such as Colace) will usually help or prevent this problem from occurring.  A mild laxative (Milk of Magnesia or Miralax) should be taken according to package instructions if there are no bowel movements after 48 hours. Unless discharge instructions indicate otherwise, you may remove your bandages 24-48 hours after surgery, and you may shower at that time.  You may have steri-strips (small skin tapes) in place directly over the incision.  These strips  should be left on the skin for 7-10 days.  If your surgeon used skin glue on the incision, you may shower in 24 hours.  The glue will flake off over the next 2-3 weeks.  Any sutures or staples will be removed  at the office during your follow-up visit. ACTIVITIES:  You may resume regular (light) daily activities beginning the next day-such as daily self-care, walking, climbing stairs-gradually increasing activities as tolerated.  You may have sexual intercourse when it is comfortable.  Refrain from any heavy lifting or straining until approved by your doctor. You may drive when you are no longer taking prescription pain medication, you can comfortably wear a seatbelt, and you can safely maneuver your car and apply brakes. RETURN TO WORK:  __________________________________________________________ Dennis Bast should see your doctor in the office for a follow-up appointment approximately 2-3 weeks after your surgery.  Make sure that you call for this appointment within a day or two after you arrive home to insure a convenient appointment time. OTHER INSTRUCTIONS: __________________________________________________________________________________________________________________________ __________________________________________________________________________________________________________________________ WHEN TO CALL YOUR DOCTOR: Fever over 101.0 Inability to urinate Continued bleeding from incision. Increased pain, redness, or drainage from the incision. Increasing abdominal pain  The clinic staff is available to answer your questions during regular business hours.  Please don't hesitate to call and ask to speak to one of the nurses for clinical concerns.  If you have a medical emergency, go to the nearest emergency room or call 911.  A surgeon from [facility] is always on call at the hospital. [address] [place], [place], [place]  [address] ? [address], [place],    [address] [phone] ? [phone] ? FAX [phone] Web site: [email or web address]   Increase activity slowly   Complete by: As directed       Allergies as of 04/11/2022       Reactions   Bee  Venom Anaphylaxis   Aspirin Itching   Lyrica [pregabalin] Rash   Pt. States that she breaks out in hives when she takes Lyrica.    Morphine And Related Itching        Medication List     TAKE these medications    acetaminophen 500 MG tablet Commonly known as: TYLENOL Take 2 tablets (1,000 mg total) by mouth every 6 (six) hours as needed for mild pain or fever.   DULoxetine 60 MG capsule Commonly known as: CYMBALTA Take 60 mg by mouth at bedtime.   fluticasone 50 MCG/ACT nasal spray Commonly known as: FLONASE Place 1-2 sprays into both nostrils daily. What changed:  when to take this reasons to take this   gabapentin 400 MG capsule Commonly known as: NEURONTIN Take 2 capsules (800 mg total) by mouth 3 (three) times daily.   gabapentin 600 MG tablet Commonly known as: NEURONTIN Take 600 mg by mouth 2 (two) times daily.   naloxone 4 MG/0.1ML Liqd nasal spray kit Commonly known as: NARCAN Place 1 spray into the nose once as needed (opiod overdose).   oxyCODONE 15 MG immediate release tablet Commonly known as: ROXICODONE Take 15 mg by mouth 4 (four) times daily as needed for severe pain.   pravastatin 10 MG tablet Commonly known as: PRAVACHOL Take 10 mg by mouth daily.   sodium chloride flush 0.9 % Soln injection Use 5 mLs by Intracatheter route daily.   traMADol 50 MG tablet Commonly known as: ULTRAM Take 1 tablet (50 mg total) by mouth every 6 (six) hours as needed for severe  pain.        Follow-up Information     Georganna Skeans, MD Follow up in 2 week(s).   Specialty: General Surgery Why: my office will call you and set up the appointment Contact information: [place] [id number] [phone]                 Signed: Zenovia Jarred 04/11/2022, 6:42 AM

## 2022-04-11 NOTE — Progress Notes (Signed)
Physical Therapy Treatment Patient Details Name: Martha Buck MRN: [id number] DOB: [birth year] Today's Date: 04/11/2022   History of Present Illness 61 y/o female admitted on 04/08/22 for laparoscopic subtotal cholecystectomy. S/p ERCP on 8/23. Recent hospitalization 6/28-7/3 for severe acute cholecystitis with percutaneous cholecystectomy tube on 6/29. No significant PMH.    PT Comments    Patient in bathroom on arrival performing bathing. Able to come into standing multiple times with supervision. No overt LOB throughout. Ambulated 200' with RW and supervision. Patient declines stair training. Eager to return home this date. D/c plan remains appropriate. Patient declines HH services at discharge.     Recommendations for follow up therapy are one component of a multi-disciplinary discharge planning process, led by the attending physician.  Recommendations may be updated based on patient status, additional functional criteria and insurance authorization.  Follow Up Recommendations  Home health PT     Assistance Recommended at Discharge PRN  Patient can return home with the following     Equipment Recommendations  None recommended by PT    Recommendations for Other Services       Precautions / Restrictions Precautions Precautions: Fall Precaution Comments: JP drain on R side Restrictions Weight Bearing Restrictions: No     Mobility  Bed Mobility               General bed mobility comments: in bathroom on arrival    Transfers Overall transfer level: Needs assistance Equipment used: Rolling Paulanthony Gleaves (2 wheels) Transfers: Sit to/from Stand Sit to Stand: Supervision           General transfer comment: supervision for safety. Multiple sit to stands in bathroom during bathing    Ambulation/Gait Ambulation/Gait assistance: Supervision Gait Distance (Feet): 200 Feet Assistive device: Rolling Terrace Chiem (2 wheels) Gait Pattern/deviations: Step-to pattern,  Decreased stride length, Knee flexed in stance - right Gait velocity: decreased     General Gait Details: supervision for safety. Baseline gait quality with R knee flexed throughout   Stairs Stairs:  (declines stair training)           Wheelchair Mobility    Modified Rankin (Stroke Patients Only)       Balance                                            Cognition Arousal/Alertness: Awake/alert Behavior During Therapy: WFL for tasks assessed/performed Overall Cognitive Status: Within Functional Limits for tasks assessed                                          Exercises      General Comments        Pertinent Vitals/Pain Pain Assessment Pain Assessment: Faces Faces Pain Scale: Hurts little more Pain Location: R flank Pain Descriptors / Indicators: Discomfort, Guarding Pain Intervention(s): Limited activity within patient's tolerance, Monitored during session    Home Living                          Prior Function            PT Goals (current goals can now be found in the care plan section) Acute Rehab PT Goals Patient Stated Goal: to go home and get better PT Goal Formulation: With  patient Time For Goal Achievement: 04/24/22 Potential to Achieve Goals: Good Progress towards PT goals: Progressing toward goals    Frequency    Min 3X/week      PT Plan Current plan remains appropriate    Co-evaluation              AM-PAC PT "6 Clicks" Mobility   Outcome Measure  Help needed turning from your back to your side while in a flat bed without using bedrails?: None Help needed moving from lying on your back to sitting on the side of a flat bed without using bedrails?: None Help needed moving to and from a bed to a chair (including a wheelchair)?: A Little Help needed standing up from a chair using your arms (e.g., wheelchair or bedside chair)?: A Little Help needed to walk in hospital room?: A  Little Help needed climbing 3-5 steps with a railing? : A Little 6 Click Score: 20    End of Session   Activity Tolerance: Patient tolerated treatment well Patient left: in bed;with call bell/phone within reach (sitting EOB) Nurse Communication: Mobility status PT Visit Diagnosis: Unsteadiness on feet (R26.81);Muscle weakness (generalized) (M62.81);Difficulty in walking, not elsewhere classified (R26.2)     Time: 0981-1914 PT Time Calculation (min) (ACUTE ONLY): 26 min  Charges:  $Gait Training: 8-22 mins $Therapeutic Activity: 8-22 mins                     Trevian Hayashida A. Dan Humphreys PT, DPT Acute Rehabilitation Services Office [phone]    Viviann Spare 04/11/2022, 11:05 AM

## 2022-04-14 ENCOUNTER — Telehealth: Payer: Self-pay

## 2022-04-14 NOTE — Telephone Encounter (Signed)
Patient is aware of ERCP scheduling date & PV has been scheduled for 06/05/22 at 10:00 am. Pt has been given instructions on when/where to go, and advised that she call back with any further questions.

## 2022-04-14 NOTE — Telephone Encounter (Signed)
Left message for patient to call back in regards to scheduled procedure.

## 2022-04-18 NOTE — Progress Notes (Signed)
Acute and chronic cholecystitis and Cholelithiasis is a clinically significant diagnosis

## 2022-04-29 ENCOUNTER — Emergency Department (HOSPITAL_COMMUNITY): Payer: Medicare Other

## 2022-04-29 ENCOUNTER — Other Ambulatory Visit: Payer: Self-pay

## 2022-04-29 ENCOUNTER — Inpatient Hospital Stay (HOSPITAL_COMMUNITY)
Admission: EM | Admit: 2022-04-29 | Discharge: 2022-05-02 | DRG: 395 | Disposition: A | Discharge status: Home / Self Care | Payer: Medicare Other | Attending: General Surgery | Admitting: General Surgery

## 2022-04-29 ENCOUNTER — Encounter (HOSPITAL_COMMUNITY): Payer: Self-pay

## 2022-04-29 DIAGNOSIS — Z87892 Personal history of anaphylaxis: Secondary | ICD-10-CM

## 2022-04-29 DIAGNOSIS — R188 Other ascites: Secondary | ICD-10-CM | POA: Diagnosis present

## 2022-04-29 DIAGNOSIS — K821 Hydrops of gallbladder: Secondary | ICD-10-CM | POA: Diagnosis present

## 2022-04-29 DIAGNOSIS — M549 Dorsalgia, unspecified: Secondary | ICD-10-CM | POA: Diagnosis present

## 2022-04-29 DIAGNOSIS — Z79899 Other long term (current) drug therapy: Secondary | ICD-10-CM | POA: Diagnosis not present

## 2022-04-29 DIAGNOSIS — R109 Unspecified abdominal pain: Secondary | ICD-10-CM | POA: Diagnosis present

## 2022-04-29 DIAGNOSIS — Z885 Allergy status to narcotic agent status: Secondary | ICD-10-CM

## 2022-04-29 DIAGNOSIS — R1310 Dysphagia, unspecified: Secondary | ICD-10-CM | POA: Diagnosis present

## 2022-04-29 DIAGNOSIS — Z96651 Presence of right artificial knee joint: Secondary | ICD-10-CM | POA: Diagnosis present

## 2022-04-29 DIAGNOSIS — Z886 Allergy status to analgesic agent status: Secondary | ICD-10-CM | POA: Diagnosis not present

## 2022-04-29 DIAGNOSIS — F17210 Nicotine dependence, cigarettes, uncomplicated: Secondary | ICD-10-CM | POA: Diagnosis present

## 2022-04-29 DIAGNOSIS — R1084 Generalized abdominal pain: Secondary | ICD-10-CM

## 2022-04-29 DIAGNOSIS — T859XXA Unspecified complication of internal prosthetic device, implant and graft, initial encounter: Principal | ICD-10-CM | POA: Diagnosis present

## 2022-04-29 DIAGNOSIS — Z4803 Encounter for change or removal of drains: Secondary | ICD-10-CM | POA: Diagnosis present

## 2022-04-29 DIAGNOSIS — R627 Adult failure to thrive: Secondary | ICD-10-CM | POA: Diagnosis present

## 2022-04-29 DIAGNOSIS — M1990 Unspecified osteoarthritis, unspecified site: Secondary | ICD-10-CM | POA: Diagnosis present

## 2022-04-29 DIAGNOSIS — Z91030 Bee allergy status: Secondary | ICD-10-CM

## 2022-04-29 DIAGNOSIS — Z9889 Other specified postprocedural states: Secondary | ICD-10-CM | POA: Diagnosis not present

## 2022-04-29 DIAGNOSIS — Z9049 Acquired absence of other specified parts of digestive tract: Secondary | ICD-10-CM | POA: Diagnosis not present

## 2022-04-29 DIAGNOSIS — Z79890 Hormone replacement therapy (postmenopausal): Secondary | ICD-10-CM

## 2022-04-29 DIAGNOSIS — K9289 Other specified diseases of the digestive system: Secondary | ICD-10-CM | POA: Diagnosis present

## 2022-04-29 DIAGNOSIS — Z09 Encounter for follow-up examination after completed treatment for conditions other than malignant neoplasm: Secondary | ICD-10-CM | POA: Diagnosis not present

## 2022-04-29 DIAGNOSIS — E860 Dehydration: Secondary | ICD-10-CM | POA: Diagnosis present

## 2022-04-29 DIAGNOSIS — F32A Depression, unspecified: Secondary | ICD-10-CM | POA: Diagnosis present

## 2022-04-29 DIAGNOSIS — E039 Hypothyroidism, unspecified: Secondary | ICD-10-CM | POA: Diagnosis present

## 2022-04-29 DIAGNOSIS — K9189 Other postprocedural complications and disorders of digestive system: Secondary | ICD-10-CM | POA: Diagnosis present

## 2022-04-29 DIAGNOSIS — E876 Hypokalemia: Secondary | ICD-10-CM | POA: Diagnosis present

## 2022-04-29 DIAGNOSIS — Z981 Arthrodesis status: Secondary | ICD-10-CM | POA: Diagnosis not present

## 2022-04-29 DIAGNOSIS — Z888 Allergy status to other drugs, medicaments and biological substances status: Secondary | ICD-10-CM | POA: Diagnosis not present

## 2022-04-29 DIAGNOSIS — Z20822 Contact with and (suspected) exposure to covid-19: Secondary | ICD-10-CM | POA: Diagnosis present

## 2022-04-29 LAB — I-STAT CHEM 8, ED
BUN: 11 mg/dL (ref 6–20)
Calcium, Ion: 1.06 mmol/L — ABNORMAL LOW (ref 1.15–1.40)
Chloride: 106 mmol/L (ref 98–111)
Creatinine, Ser: 0.6 mg/dL (ref 0.44–1.00)
Glucose, Bld: 106 mg/dL — ABNORMAL HIGH (ref 70–99)
HCT: 30 % — ABNORMAL LOW (ref 36.0–46.0)
Hemoglobin: 10.2 g/dL — ABNORMAL LOW (ref 12.0–15.0)
Potassium: 4.2 mmol/L (ref 3.5–5.1)
Sodium: 139 mmol/L (ref 135–145)
TCO2: 23 mmol/L (ref 22–32)

## 2022-04-29 LAB — COMPREHENSIVE METABOLIC PANEL
ALT: 19 U/L (ref 0–44)
AST: 23 U/L (ref 15–41)
Albumin: 2.9 g/dL — ABNORMAL LOW (ref 3.5–5.0)
Alkaline Phosphatase: 100 U/L (ref 38–126)
Anion gap: 9 (ref 5–15)
BUN: 12 mg/dL (ref 6–20)
CO2: 23 mmol/L (ref 22–32)
Calcium: 8.3 mg/dL — ABNORMAL LOW (ref 8.9–10.3)
Chloride: 107 mmol/L (ref 98–111)
Creatinine, Ser: 0.7 mg/dL (ref 0.44–1.00)
GFR, Estimated: >60 mL/min (ref >60)
Glucose, Bld: 111 mg/dL — ABNORMAL HIGH (ref 70–99)
Potassium: 3.8 mmol/L (ref 3.5–5.1)
Sodium: 139 mmol/L (ref 135–145)
Total Bilirubin: 0.4 mg/dL (ref 0.3–1.2)
Total Protein: 8 g/dL (ref 6.5–8.1)

## 2022-04-29 LAB — URINALYSIS, ROUTINE W REFLEX MICROSCOPIC
Bilirubin Urine: NEGATIVE
Glucose, UA: NEGATIVE mg/dL
Ketones, ur: NEGATIVE mg/dL
Nitrite: NEGATIVE
Protein, ur: 30 mg/dL — AB
Specific Gravity, Urine: 1.024 (ref 1.005–1.030)
pH: 5 (ref 5.0–8.0)

## 2022-04-29 LAB — CBC WITH DIFFERENTIAL/PLATELET
Abs Immature Granulocytes: 0.08 10*3/uL — ABNORMAL HIGH (ref 0.00–0.07)
Basophils Absolute: 0 10*3/uL (ref 0.0–0.1)
Basophils Relative: 0 %
Eosinophils Absolute: 0.2 10*3/uL (ref 0.0–0.5)
Eosinophils Relative: 1 %
HCT: 29.1 % — ABNORMAL LOW (ref 36.0–46.0)
Hemoglobin: 9.2 g/dL — ABNORMAL LOW (ref 12.0–15.0)
Immature Granulocytes: 1 %
Lymphocytes Relative: 13 %
Lymphs Abs: 2 10*3/uL (ref 0.7–4.0)
MCH: 29.6 pg (ref 26.0–34.0)
MCHC: 31.6 g/dL (ref 30.0–36.0)
MCV: 93.6 fL (ref 80.0–100.0)
Monocytes Absolute: 1.3 10*3/uL — ABNORMAL HIGH (ref 0.1–1.0)
Monocytes Relative: 9 %
Neutro Abs: 11.6 10*3/uL — ABNORMAL HIGH (ref 1.7–7.7)
Neutrophils Relative %: 76 %
Platelets: 549 10*3/uL — ABNORMAL HIGH (ref 150–400)
RBC: 3.11 MIL/uL — ABNORMAL LOW (ref 3.87–5.11)
RDW: 14.2 % (ref 11.5–15.5)
WBC: 15.3 10*3/uL — ABNORMAL HIGH (ref 4.0–10.5)
nRBC: 0 % (ref 0.0–0.2)

## 2022-04-29 LAB — LACTIC ACID, PLASMA
Lactic Acid, Venous: 1.8 mmol/L (ref 0.5–1.9)
Lactic Acid, Venous: 0.8 mmol/L (ref 0.5–1.9)

## 2022-04-29 LAB — PROTIME-INR
INR: 1.2 (ref 0.8–1.2)
Prothrombin Time: 15.1 seconds (ref 11.4–15.2)

## 2022-04-29 LAB — TROPONIN I (HIGH SENSITIVITY)
Troponin I (High Sensitivity): 4 ng/L (ref <18)
Troponin I (High Sensitivity): 3 ng/L (ref <18)

## 2022-04-29 LAB — RESP PANEL BY RT-PCR (FLU A&B, COVID) ARPGX2
Influenza A by PCR: NEGATIVE
Influenza B by PCR: NEGATIVE
SARS Coronavirus 2 by RT PCR: NEGATIVE

## 2022-04-29 MED ORDER — PIPERACILLIN-TAZOBACTAM 3.375 G IVPB
3.3750 g | Freq: Three times a day (TID) | INTRAVENOUS | Status: DC
  Administered 2022-04-30 – 2022-05-02 (×6): 3.375 g via INTRAVENOUS
  Filled 2022-04-29 (×7): qty 50

## 2022-04-29 MED ORDER — LACTATED RINGERS IV BOLUS (SEPSIS)
1000.0000 mL | Freq: Once | INTRAVENOUS | Status: AC
  Administered 2022-04-29: 1000 mL via INTRAVENOUS

## 2022-04-29 MED ORDER — PIPERACILLIN-TAZOBACTAM 3.375 G IVPB 30 MIN
3.3750 g | Freq: Once | INTRAVENOUS | Status: AC
  Administered 2022-04-29: 3.375 g via INTRAVENOUS
  Filled 2022-04-29: qty 50

## 2022-04-29 MED ORDER — FENTANYL CITRATE PF 50 MCG/ML IJ SOSY
50.0000 ug | PREFILLED_SYRINGE | Freq: Once | INTRAMUSCULAR | Status: AC
  Administered 2022-04-29: 50 ug via INTRAVENOUS
  Filled 2022-04-29: qty 1

## 2022-04-29 MED ORDER — IOHEXOL 300 MG/ML  SOLN
100.0000 mL | Freq: Once | INTRAMUSCULAR | Status: AC | PRN
  Administered 2022-04-29: 100 mL via INTRAVENOUS

## 2022-04-29 NOTE — ED Triage Notes (Signed)
Pt states she has had gallbladder surgery 8/22 and has been having trouble swallowing since. Pt c/o difficulty eating and drinking. Pt 100% RA. Pt denies SOB. Pt has JP drain taped to her chest.

## 2022-04-29 NOTE — Progress Notes (Signed)
A consult was received from an ED physician for piperacillin/tazobactam per pharmacy dosing.  The patient's profile has been reviewed for ht/wt/allergies/indication/available labs.    A one time order has been placed for piperacillin/tazobactam 3.375 g IV once to be infused over 30 minutes.    Further antibiotics/pharmacy consults should be ordered by admitting physician if indicated.                       Thank you, Cindi Carbon, PharmD 04/29/2022  8:40 PM

## 2022-04-29 NOTE — ED Notes (Signed)
Called [facility] to request transport at 2303pm

## 2022-04-29 NOTE — Progress Notes (Signed)
Pharmacy Antibiotic Note  Martha Buck is a 61 y.o. female admitted on 04/29/2022 with intra-abdominal infection.  Pharmacy has been consulted for Zosyn dosing.  Plan: Zosyn 3.375g IV q8h (4 hour infusion). Need for further dosage adjustment appears unlikely at present.    Will sign off at this time.  Please reconsult if a change in clinical status warrants re-evaluation of dosage.    Temp (24hrs), Avg:99.4 F (37.4 C), Min:98.5 F (36.9 C), Max:100.2 F (37.9 C)  Recent Labs  Lab 04/29/22 1738 04/29/22 1810 04/29/22 1941  WBC 15.3*  --   --   CREATININE 0.70 0.60  --   LATICACIDVEN 1.8  --  0.8    CrCl cannot be calculated (Unknown ideal weight.).    Allergies  Allergen Reactions   Bee Venom Anaphylaxis   Aspirin Itching   Lyrica [Pregabalin] Rash    Pt. States that she breaks out in hives when she takes Lyrica.    Morphine And Related Itching      Thank you for allowing pharmacy to be a part of this patient's care.  Maryellen Pile, PharmD 04/29/2022 10:38 PM

## 2022-04-29 NOTE — ED Provider Notes (Signed)
[facility]-EMERGENCY DEPT Provider Note   CSN: [id number] Arrival date & time: 04/29/22  1701     History {Add pertinent medical, surgical, social history, OB history to HPI:1} Chief Complaint  Patient presents with   Dysphagia    Noma Tranbarger Propp is a 61 y.o. female.  HPI Patient presents for***.  Medical history includes arthritis, referred ptosis, depression.  She had an admission in June for cholecystitis.  She underwent percutaneous cholecystostomy on 6/29.  She was readmitted last month and underwent subtotal cholecystectomy, lysis of adhesions, and the ERCP with stent placement.  She has not yet followed up with her surgeon.  She states that she has only been able to tolerate water since her recent surgery 3 weeks ago.  She has had ongoing abdominal pain which has not worsened.  She has developed back pain which she describes as muscle tightness.  Over the past 3 days, she has had difficulty swallowing.  She describes a globus sensation in her throat.  She endorses odynophagia.  Because of this, she has not taken any medications in the past several days.    Home Medications Prior to Admission medications   Medication Sig Start Date End Date Taking? Authorizing Provider  acetaminophen (TYLENOL) 500 MG tablet Take 2 tablets (1,000 mg total) by mouth every 6 (six) hours as needed for mild pain or fever. 02/17/22   Juliet Rude, PA-C  DULoxetine (CYMBALTA) 60 MG capsule Take 60 mg by mouth at bedtime. 01/25/22   [provider]  fluticasone (FLONASE) 50 MCG/ACT nasal spray Place 1-2 sprays into both nostrils daily. Patient taking differently: Place 1-2 sprays into both nostrils daily as needed for allergies. 06/03/20   Wieters, Hallie C, PA-C  gabapentin (NEURONTIN) 400 MG capsule Take 2 capsules (800 mg total) by mouth 3 (three) times daily. Patient not taking: Reported on 04/07/2022 02/17/22   Juliet Rude, PA-C  gabapentin (NEURONTIN) 600 MG  tablet Take 600 mg by mouth 2 (two) times daily. 03/25/22   [provider]  naloxone [facility]) nasal spray 4 mg/0.1 mL Place 1 spray into the nose once as needed (opiod overdose). 01/25/22   [provider]  oxyCODONE (ROXICODONE) 15 MG immediate release tablet Take 15 mg by mouth 4 (four) times daily as needed for severe pain. 01/25/22   [provider]  pravastatin (PRAVACHOL) 10 MG tablet Take 10 mg by mouth daily. 12/26/21   [provider]  sodium chloride flush 0.9 % SOLN injection Use 5 mLs by Intracatheter route daily. 02/16/22   Han, Aimee H, PA-C  traMADol (ULTRAM) 50 MG tablet Take 1 tablet (50 mg total) by mouth every 6 (six) hours as needed for severe pain. 04/11/22   Violeta Gelinas, MD      Allergies    Bee venom, Aspirin, Lyrica [pregabalin], and Morphine and related    Review of Systems   Review of Systems  HENT:  Positive for sore throat and trouble swallowing.   Gastrointestinal:  Positive for abdominal pain and nausea.  Musculoskeletal:  Positive for back pain.  Psychiatric/Behavioral:  The patient is nervous/anxious.   All other systems reviewed and are negative.   Physical Exam Updated Vital Signs BP (!) 122/90   Pulse (!) 125   Temp 100.2 F (37.9 C)   Resp (!) 26   LMP 04/26/2012   SpO2 99%  Physical Exam Vitals and nursing note reviewed.  Constitutional:      Appearance: Normal appearance. She is  well-developed. She is not ill-appearing, toxic-appearing or diaphoretic.  HENT:     Head: Normocephalic and atraumatic.     Right Ear: External ear normal.     Left Ear: External ear normal.     Nose: Nose normal.     Mouth/Throat:     Mouth: Mucous membranes are moist.     Pharynx: Oropharynx is clear. No oropharyngeal exudate or posterior oropharyngeal erythema.     Comments: Oropharynx widely patent Eyes:     General: No scleral icterus.    Extraocular Movements: Extraocular movements intact.     Conjunctiva/sclera:  Conjunctivae normal.  Cardiovascular:     Rate and Rhythm: Normal rate and regular rhythm.     Heart sounds: No murmur heard. Pulmonary:     Effort: Pulmonary effort is normal. No respiratory distress.     Breath sounds: Normal breath sounds. No wheezing or rales.  Chest:     Chest wall: No tenderness.  Abdominal:     General: There is no distension.     Palpations: Abdomen is soft.     Tenderness: There is no abdominal tenderness. There is no guarding or rebound.     Comments: JP drain in place.  Contents are a milky white.  Musculoskeletal:        General: No swelling. Normal range of motion.     Cervical back: Normal range of motion and neck supple.  Skin:    General: Skin is warm and dry.     Capillary Refill: Capillary refill takes less than 2 seconds.     Coloration: Skin is not jaundiced or pale.  Neurological:     General: No focal deficit present.     Mental Status: She is alert and oriented to person, place, and time.     Cranial Nerves: No cranial nerve deficit.     Sensory: No sensory deficit.     Motor: No weakness.     Coordination: Coordination normal.  Psychiatric:        Mood and Affect: Mood is anxious. Affect is tearful.        Behavior: Behavior is cooperative.     ED Results / Procedures / Treatments   Labs (all labs ordered are listed, but only abnormal results are displayed) Labs Reviewed - No data to display  EKG None  Radiology No results found.  Procedures Procedures  {Document cardiac monitor, telemetry assessment procedure when appropriate:1}  Medications Ordered in ED Medications - No data to display  ED Course/ Medical Decision Making/ A&P                           Medical Decision Making  This patient presents to the ED for concern of ***, this involves an extensive number of treatment options, and is a complaint that carries with it a high risk of complications and morbidity.  The differential diagnosis includes ***   Co  morbidities that complicate the patient evaluation  ***   Additional history obtained:  Additional history obtained from *** External records from outside source obtained and reviewed including ***   Lab Tests:  I Ordered, and personally interpreted labs.  The pertinent results include:  ***   Imaging Studies ordered:  I ordered imaging studies including ***  I independently visualized and interpreted imaging which showed *** I agree with the radiologist interpretation   Cardiac Monitoring: / EKG:  The patient was maintained on a cardiac monitor.  I  personally viewed and interpreted the cardiac monitored which showed an underlying rhythm of: ***   Consultations Obtained:  I requested consultation with the ***,  and discussed lab and imaging findings as well as pertinent plan - they recommend: ***   Problem List / ED Course / Critical interventions / Medication management  Patient presents for uncontrolled abdominal and back pain in addition to difficulty swallowing for the past several days.  She underwent percutaneous cholecystostomy in late June.  She underwent further surgery on 8/22.  She reports that since that time she has been able to tolerate water only.  She has had ongoing abdominal pain and has developed back pain as well.  She has had difficulty swallowing for the past 3 days and endorses a globus sensation in the area of her suprasternal notch.  Patient arrives in the ED tearful and anxious.  Vital signs are notable for tachycardia, tachypnea, and a temperature of 100.2 degrees.  On exam, her abdomen is soft with minimal tenderness.  She has a JP drain in place and contents of bulb are clear with white sediment.  Per chart review, she has a scheduled follow-up appointment with her surgeon tomorrow.  Patient's vital signs are concerning for sepsis.  Septic work-up was initiated.  Given her decreased p.o. intake, IV fluids were ordered for hydration.  Patient was ordered  fentanyl for analgesia.  CT imaging***. I ordered medication including ***  for ***  Reevaluation of the patient after these medicines showed that the patient {resolved/improved/worsened:23923::"improved"} I have reviewed the patients home medicines and have made adjustments as needed   Social Determinants of Health:  ***   Test / Admission - Considered:  ***   {Document critical care time when appropriate:1} {Document review of labs and clinical decision tools ie heart score, Chads2Vasc2 etc:1}  {Document your independent review of radiology images, and any outside records:1} {Document your discussion with family members, caretakers, and with consultants:1} {Document social determinants of health affecting pt's care:1} {Document your decision making why or why not admission, treatments were needed:1} Final Clinical Impression(s) / ED Diagnoses Final diagnoses:  None    Rx / DC Orders ED Discharge Orders     None

## 2022-04-30 ENCOUNTER — Encounter (HOSPITAL_COMMUNITY): Payer: Self-pay

## 2022-04-30 LAB — COMPREHENSIVE METABOLIC PANEL
ALT: 16 U/L (ref 0–44)
AST: 15 U/L (ref 15–41)
Albumin: 2.2 g/dL — ABNORMAL LOW (ref 3.5–5.0)
Alkaline Phosphatase: 81 U/L (ref 38–126)
Anion gap: 9 (ref 5–15)
BUN: 9 mg/dL (ref 6–20)
CO2: 24 mmol/L (ref 22–32)
Calcium: 8.1 mg/dL — ABNORMAL LOW (ref 8.9–10.3)
Chloride: 104 mmol/L (ref 98–111)
Creatinine, Ser: 0.66 mg/dL (ref 0.44–1.00)
GFR, Estimated: >60 mL/min (ref >60)
Glucose, Bld: 106 mg/dL — ABNORMAL HIGH (ref 70–99)
Potassium: 3.2 mmol/L — ABNORMAL LOW (ref 3.5–5.1)
Sodium: 137 mmol/L (ref 135–145)
Total Bilirubin: 0.7 mg/dL (ref 0.3–1.2)
Total Protein: 6.2 g/dL — ABNORMAL LOW (ref 6.5–8.1)

## 2022-04-30 LAB — CBC
HCT: 23.6 % — ABNORMAL LOW (ref 36.0–46.0)
Hemoglobin: 7.5 g/dL — ABNORMAL LOW (ref 12.0–15.0)
MCH: 29.1 pg (ref 26.0–34.0)
MCHC: 31.8 g/dL (ref 30.0–36.0)
MCV: 91.5 fL (ref 80.0–100.0)
Platelets: 447 10*3/uL — ABNORMAL HIGH (ref 150–400)
RBC: 2.58 MIL/uL — ABNORMAL LOW (ref 3.87–5.11)
RDW: 13.9 % (ref 11.5–15.5)
WBC: 10.8 10*3/uL — ABNORMAL HIGH (ref 4.0–10.5)
nRBC: 0 % (ref 0.0–0.2)

## 2022-04-30 LAB — APTT: aPTT: 34 seconds (ref 24–36)

## 2022-04-30 LAB — PROTIME-INR
INR: 1.3 — ABNORMAL HIGH (ref 0.8–1.2)
Prothrombin Time: 15.7 seconds — ABNORMAL HIGH (ref 11.4–15.2)

## 2022-04-30 MED ORDER — PANTOPRAZOLE SODIUM 40 MG IV SOLR
40.0000 mg | Freq: Every day | INTRAVENOUS | Status: DC
  Administered 2022-04-30 (×2): 40 mg via INTRAVENOUS
  Filled 2022-04-30 (×2): qty 10

## 2022-04-30 MED ORDER — SUCRALFATE 1 GM/10ML PO SUSP
1.0000 g | Freq: Three times a day (TID) | ORAL | Status: DC
  Administered 2022-04-30 – 2022-05-01 (×8): 1 g via ORAL
  Filled 2022-04-30 (×8): qty 10

## 2022-04-30 MED ORDER — KCL IN DEXTROSE-NACL 20-5-0.45 MEQ/L-%-% IV SOLN
INTRAVENOUS | Status: DC
  Filled 2022-04-30 (×4): qty 1000

## 2022-04-30 MED ORDER — POTASSIUM CHLORIDE 20 MEQ PO PACK
40.0000 meq | PACK | Freq: Once | ORAL | Status: AC
  Administered 2022-04-30: 40 meq via ORAL
  Filled 2022-04-30: qty 2

## 2022-04-30 MED ORDER — POLYETHYLENE GLYCOL 3350 17 G PO PACK: 17.0000 g | PACK | Freq: Every day | ORAL | Status: DC | PRN

## 2022-04-30 MED ORDER — DOCUSATE SODIUM 100 MG PO CAPS
100.0000 mg | ORAL_CAPSULE | Freq: Two times a day (BID) | ORAL | Status: DC
  Administered 2022-04-30 – 2022-05-01 (×5): 100 mg via ORAL
  Filled 2022-04-30 (×5): qty 1

## 2022-04-30 MED ORDER — ONDANSETRON HCL 4 MG/2ML IJ SOLN: 4.0000 mg | Freq: Four times a day (QID) | INTRAMUSCULAR | Status: DC | PRN

## 2022-04-30 MED ORDER — ONDANSETRON 4 MG PO TBDP: 4.0000 mg | ORAL_TABLET | Freq: Four times a day (QID) | ORAL | Status: DC | PRN

## 2022-04-30 MED ORDER — ENOXAPARIN SODIUM 40 MG/0.4ML IJ SOSY
40.0000 mg | PREFILLED_SYRINGE | INTRAMUSCULAR | Status: DC
  Administered 2022-04-30 – 2022-05-01 (×2): 40 mg via SUBCUTANEOUS
  Filled 2022-04-30 (×2): qty 0.4

## 2022-04-30 MED ORDER — DIPHENHYDRAMINE HCL 50 MG/ML IJ SOLN: 12.5000 mg | Freq: Four times a day (QID) | INTRAMUSCULAR | Status: DC | PRN

## 2022-04-30 MED ORDER — GABAPENTIN 600 MG PO TABS: 600.0000 mg | ORAL_TABLET | Freq: Two times a day (BID) | ORAL | Status: DC | PRN

## 2022-04-30 MED ORDER — ACETAMINOPHEN 500 MG PO TABS
1000.0000 mg | ORAL_TABLET | Freq: Four times a day (QID) | ORAL | Status: DC
  Administered 2022-04-30 – 2022-05-02 (×7): 1000 mg via ORAL
  Filled 2022-04-30 (×8): qty 2

## 2022-04-30 MED ORDER — MELATONIN 3 MG PO TABS: 3.0000 mg | ORAL_TABLET | Freq: Every evening | ORAL | Status: DC | PRN

## 2022-04-30 MED ORDER — SIMETHICONE 80 MG PO CHEW: 40.0000 mg | CHEWABLE_TABLET | Freq: Four times a day (QID) | ORAL | Status: DC | PRN

## 2022-04-30 MED ORDER — DIPHENHYDRAMINE HCL 12.5 MG/5ML PO ELIX: 12.5000 mg | ORAL_SOLUTION | Freq: Four times a day (QID) | ORAL | Status: DC | PRN

## 2022-04-30 MED ORDER — OXYCODONE HCL 5 MG PO TABS
5.0000 mg | ORAL_TABLET | ORAL | Status: DC | PRN
  Administered 2022-04-30 – 2022-05-01 (×2): 5 mg via ORAL
  Filled 2022-04-30 (×2): qty 1

## 2022-04-30 NOTE — H&P (Signed)
Martha Buck is an 61 y.o. female.   Chief Complaint: not eating well HPI: 61yo F well known to our service: H/o cholecystostomy tube 6/23 on DOW  Interval cholecystectomy 8/22 by Dr. Janee Morn and known CBD stones.  Difficult lap chole -  lap loa x , subtotal cholecystectomy, drain  ERCP/sphinterotomy/stent 8/23 by Dr. Jamie Brookes to ED 9/12 with FTT, pain, trouble swallowing x 3 days. 2 fluid collections - GB fossa - looks like surgical drain goes into it; other one is pelvis.  Picture of drain in media - doesn't look like bile. LFTs ok. Wbc 15. (Was 15 on discharge)    Reports this AM that swallowing feels better.  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   History of kidney stones    hx of   History of pneumonia    Joint pain     Past Surgical History:  Procedure Laterality Date   BACK SURGERY     BILIARY STENT PLACEMENT  04/09/2022   Procedure: BILIARY STENT PLACEMENT;  Surgeon: Meryl Dare, MD;  Location: [facility] ENDOSCOPY;  Service: Gastroenterology;;   CHOLECYSTECTOMY N/A 04/08/2022   Procedure: LAPAROSCOPIC CHOLECYSTECTOMY;  Surgeon: Violeta Gelinas, MD;  Location: [facility] OR;  Service: General;  Laterality: N/A;   ECTOPIC PREGNANCY SURGERY  1991   ERCP N/A 04/09/2022   Procedure: ENDOSCOPIC RETROGRADE CHOLANGIOPANCREATOGRAPHY (ERCP);  Surgeon: Meryl Dare, MD;  Location: [facility] ENDOSCOPY;  Service: Gastroenterology;  Laterality: N/A;   IR PERC CHOLECYSTOSTOMY  02/13/2022   IR RADIOLOGIST EVAL & MGMT  03/31/2022   KNEE ARTHROSCOPY WITH MEDIAL MENISECTOMY Right 11/26/2012   Procedure: KNEE ARTHROSCOPY WITH PARTIAL MEDIAL MENISECTOMY,DEBRIDEMENT;  Surgeon: Javier Docker, MD;  Location: [facility];  Service: Orthopedics;  Laterality: Right;   KNEE CLOSED REDUCTION Right 01/27/2017   Procedure: CLOSED MANIPULATION RIGHT KNEE;  Surgeon: Sheral Apley, MD;  Location: [facility];  Service: Orthopedics;  Laterality: Right;   LUMBAR FUSION  03-20-2010   L4 -- L5   SPHINCTEROTOMY  04/09/2022   Procedure: SPHINCTEROTOMY;  Surgeon: Meryl Dare, MD;  Location: [facility] ENDOSCOPY;  Service: Gastroenterology;;   TOTAL KNEE ARTHROPLASTY Right 11/11/2016   Procedure: TOTAL KNEE ARTHROPLASTY;  Surgeon: Sheral Apley, MD;  Location: [facility] OR;  Service: Orthopedics;  Laterality: Right;    Family History  Problem Relation Age of Onset   Diabetes Mellitus II Mother    Hypertension Mother    Social History:  reports that she has been smoking cigarettes. She has a 8.50 pack-year smoking history. She has never used smokeless tobacco. She reports current alcohol use. She reports that she does not use drugs.  Allergies:  Allergies  Allergen Reactions   Bee Venom Anaphylaxis   Aspirin Itching   Lyrica [Pregabalin] Rash and Other (See Comments)    Pt. States that she breaks out in hives when she takes Lyrica.    Morphine And Related Itching    Medications Prior to Admission  Medication Sig Dispense Refill   acetaminophen (TYLENOL) 500 MG tablet Take 2 tablets (1,000 mg total) by mouth every 6 (six) hours as needed for mild pain or fever.     DULoxetine (CYMBALTA) 60 MG capsule Take 60 mg by mouth at bedtime.     fluticasone (FLONASE) 50 MCG/ACT nasal spray Place 1-2 sprays into both nostrils daily. (Patient taking differently: Place 1-2 sprays into  both nostrils daily as needed for allergies.) 16 g 0   gabapentin (NEURONTIN) 400 MG capsule Take 2 capsules (800 mg total) by mouth 3 (three) times daily. 30 capsule 0   levothyroxine (SYNTHROID) 25 MCG tablet Take 25 mcg by mouth daily.     oxyCODONE (ROXICODONE) 15 MG immediate release tablet Take 15 mg by mouth 4 (four) times daily as needed for severe pain.     traMADol (ULTRAM) 50 MG tablet Take 1 tablet (50 mg total) by mouth every 6 (six) hours as needed for severe pain. 30 tablet 0   Vitamin D, Ergocalciferol, (DRISDOL)  1.25 MG (50000 UNIT) CAPS capsule Take 50,000 Units by mouth every 7 (seven) days.     naloxone (NARCAN) nasal spray 4 mg/0.1 mL Place 1 spray into the nose once as needed (opiod overdose).     pravastatin (PRAVACHOL) 10 MG tablet Take 10 mg by mouth daily.     sodium chloride flush 0.9 % SOLN injection Use 5 mLs by Intracatheter route daily. (Patient not taking: Reported on 04/30/2022) 200 mL 1    Results for orders placed or performed during the hospital encounter of 04/29/22 (from the past 48 hour(s))  Lactic acid, plasma     Status: None   Collection Time: 04/29/22  5:38 PM  Result Value Ref Range   Lactic Acid, Venous 1.8 0.5 - 1.9 mmol/L    Comment: Performed at [facility], [address], [place], [place] [address]  Comprehensive metabolic panel     Status: Abnormal   Collection Time: 04/29/22  5:38 PM  Result Value Ref Range   Sodium 139 135 - 145 mmol/L   Potassium 3.8 3.5 - 5.1 mmol/L   Chloride 107 98 - 111 mmol/L   CO2 23 22 - 32 mmol/L   Glucose, Bld 111 (H) 70 - 99 mg/dL    Comment: Glucose reference range applies only to samples taken after fasting for at least 8 hours.   BUN 12 6 - 20 mg/dL   Creatinine, Ser 7.82 0.44 - 1.00 mg/dL   Calcium 8.3 (L) 8.9 - 10.3 mg/dL   Total Protein 8.0 6.5 - 8.1 g/dL   Albumin 2.9 (L) 3.5 - 5.0 g/dL   AST 23 15 - 41 U/L   ALT 19 0 - 44 U/L   Alkaline Phosphatase 100 38 - 126 U/L   Total Bilirubin 0.4 0.3 - 1.2 mg/dL   GFR, Estimated >95 >62 mL/min    Comment: (NOTE) Calculated using the CKD-EPI Creatinine Equation (2021)    Anion gap 9 5 - 15    Comment: Performed at [facility], [address] [place], [place] [address]  CBC with Differential     Status: Abnormal   Collection Time: 04/29/22  5:38 PM  Result Value Ref Range   WBC 15.3 (H) 4.0 - 10.5 K/uL   RBC 3.11 (L) 3.87 - 5.11 MIL/uL   Hemoglobin 9.2 (L) 12.0 - 15.0 g/dL   HCT 57.8 (L) 46.9 - 62.9 %   MCV 93.6 80.0 - 100.0 fL    MCH 29.6 26.0 - 34.0 pg   MCHC 31.6 30.0 - 36.0 g/dL   RDW 52.8 41.3 - 24.4 %   Platelets 549 (H) 150 - 400 K/uL   nRBC 0.0 0.0 - 0.2 %   Neutrophils Relative % 76 %   Neutro Abs 11.6 (H) 1.7 - 7.7 K/uL   Lymphocytes Relative 13 %   Lymphs Abs 2.0 0.7 -  4.0 K/uL   Monocytes Relative 9 %   Monocytes Absolute 1.3 (H) 0.1 - 1.0 K/uL   Eosinophils Relative 1 %   Eosinophils Absolute 0.2 0.0 - 0.5 K/uL   Basophils Relative 0 %   Basophils Absolute 0.0 0.0 - 0.1 K/uL   Immature Granulocytes 1 %   Abs Immature Granulocytes 0.08 (H) 0.00 - 0.07 K/uL    Comment: Performed at [facility], [address] [place], [place] [address]  Protime-INR     Status: None   Collection Time: 04/29/22  5:38 PM  Result Value Ref Range   Prothrombin Time 15.1 11.4 - 15.2 seconds   INR 1.2 0.8 - 1.2    Comment: (NOTE) INR goal varies based on device and disease states. Performed at [facility], [address], [place], [place] [address]   Urinalysis, Routine w reflex microscopic Peripheral     Status: Abnormal   Collection Time: 04/29/22  5:38 PM  Result Value Ref Range   Color, Urine AMBER (A) YELLOW    Comment: BIOCHEMICALS MAY BE AFFECTED BY COLOR   APPearance CLOUDY (A) CLEAR   Specific Gravity, Urine 1.024 1.005 - 1.030   pH 5.0 5.0 - 8.0   Glucose, UA NEGATIVE NEGATIVE mg/dL   Hgb urine dipstick SMALL (A) NEGATIVE   Bilirubin Urine NEGATIVE NEGATIVE   Ketones, ur NEGATIVE NEGATIVE mg/dL   Protein, ur 30 (A) NEGATIVE mg/dL   Nitrite NEGATIVE NEGATIVE   Leukocytes,Ua TRACE (A) NEGATIVE   RBC / HPF 6-10 0 - 5 RBC/hpf   WBC, UA 6-10 0 - 5 WBC/hpf   Bacteria, UA RARE (A) NONE SEEN   Squamous Epithelial / LPF 11-20 0 - 5   Mucus PRESENT     Comment: Performed at [facility], [address], [place], [place] [address]  Troponin I (High Sensitivity)     Status: None   Collection Time: 04/29/22  5:38 PM  Result Value Ref Range    Troponin I (High Sensitivity) 4 <18 ng/L    Comment: (NOTE) Elevated high sensitivity troponin I (hsTnI) values and significant  changes across serial measurements may suggest ACS but many other  chronic and acute conditions are known to elevate hsTnI results.  Refer to the "Links" section for chest pain algorithms and additional  guidance. Performed at [facility], [address], [place], [place] [address]   I-stat chem 8, ED (not at [facility] or [facility])     Status: Abnormal   Collection Time: 04/29/22  6:10 PM  Result Value Ref Range   Sodium 139 135 - 145 mmol/L   Potassium 4.2 3.5 - 5.1 mmol/L   Chloride 106 98 - 111 mmol/L   BUN 11 6 - 20 mg/dL   Creatinine, Ser 9.83 0.44 - 1.00 mg/dL   Glucose, Bld 382 (H) 70 - 99 mg/dL    Comment: Glucose reference range applies only to samples taken after fasting for at least 8 hours.   Calcium, Ion 1.06 (L) 1.15 - 1.40 mmol/L   TCO2 23 22 - 32 mmol/L   Hemoglobin 10.2 (L) 12.0 - 15.0 g/dL   HCT 50.5 (L) 39.7 - 67.3 %  Troponin I (High Sensitivity)     Status: None   Collection Time: 04/29/22  7:38 PM  Result Value Ref Range   Troponin I (High Sensitivity) 3 <18 ng/L    Comment: (NOTE) Elevated high sensitivity troponin I (hsTnI) values and significant  changes  across serial measurements may suggest ACS but many other  chronic and acute conditions are known to elevate hsTnI results.  Refer to the "Links" section for chest pain algorithms and additional  guidance. Performed at [facility], [address], [place], [place] [address]   Lactic acid, plasma     Status: None   Collection Time: 04/29/22  7:41 PM  Result Value Ref Range   Lactic Acid, Venous 0.8 0.5 - 1.9 mmol/L    Comment: Performed at [facility], [address], [place], [place] [address]  Resp Panel by RT-PCR (Flu A&B, Covid) Anterior Nasal Swab     Status: None   Collection Time: 04/29/22  7:41 PM   Specimen:  Anterior Nasal Swab  Result Value Ref Range   SARS Coronavirus 2 by RT PCR NEGATIVE NEGATIVE    Comment: (NOTE) SARS-CoV-2 target nucleic acids are NOT DETECTED.  The SARS-CoV-2 RNA is generally detectable in upper respiratory specimens during the acute phase of infection. The lowest concentration of SARS-CoV-2 viral copies this assay can detect is 138 copies/mL. A negative result does not preclude SARS-Cov-2 infection and should not be used as the sole basis for treatment or other patient management decisions. A negative result may occur with  improper specimen collection/handling, submission of specimen other than nasopharyngeal swab, presence of viral mutation(s) within the areas targeted by this assay, and inadequate number of viral copies(<138 copies/mL). A negative result must be combined with clinical observations, patient history, and epidemiological information. The expected result is Negative.  Fact Sheet for Patients:  BloggerCourse.com  Fact Sheet for Healthcare Providers:  SeriousBroker.it  This test is no t yet approved or cleared by the [place] FDA and  has been authorized for detection and/or diagnosis of SARS-CoV-2 by FDA under an Emergency Use Authorization (EUA). This EUA will remain  in effect (meaning this test can be used) for the duration of the COVID-19 declaration under Section 564(b)(1) of the Act, 21 U.S.C.section 360bbb-3(b)(1), unless the authorization is terminated  or revoked sooner.       Influenza A by PCR NEGATIVE NEGATIVE   Influenza B by PCR NEGATIVE NEGATIVE    Comment: (NOTE) The Xpert Xpress SARS-CoV-2/FLU/RSV plus assay is intended as an aid in the diagnosis of influenza from Nasopharyngeal swab specimens and should not be used as a sole basis for treatment. Nasal washings and aspirates are unacceptable for Xpert Xpress SARS-CoV-2/FLU/RSV testing.  Fact Sheet for  Patients: BloggerCourse.com  Fact Sheet for Healthcare Providers: SeriousBroker.it  This test is not yet approved or cleared by the [place] FDA and has been authorized for detection and/or diagnosis of SARS-CoV-2 by FDA under an Emergency Use Authorization (EUA). This EUA will remain in effect (meaning this test can be used) for the duration of the COVID-19 declaration under Section 564(b)(1) of the Act, 21 U.S.C. section 360bbb-3(b)(1), unless the authorization is terminated or revoked.  Performed at [facility], [address] [place], [place] [address]   Comprehensive metabolic panel     Status: Abnormal   Collection Time: 04/30/22  1:17 AM  Result Value Ref Range   Sodium 137 135 - 145 mmol/L   Potassium 3.2 (L) 3.5 - 5.1 mmol/L   Chloride 104 98 - 111 mmol/L   CO2 24 22 - 32 mmol/L   Glucose, Bld 106 (H) 70 - 99 mg/dL    Comment: Glucose reference range applies only to samples taken after fasting for at least  8 hours.   BUN 9 6 - 20 mg/dL   Creatinine, Ser 7.32 0.44 - 1.00 mg/dL   Calcium 8.1 (L) 8.9 - 10.3 mg/dL   Total Protein 6.2 (L) 6.5 - 8.1 g/dL   Albumin 2.2 (L) 3.5 - 5.0 g/dL   AST 15 15 - 41 U/L   ALT 16 0 - 44 U/L   Alkaline Phosphatase 81 38 - 126 U/L   Total Bilirubin 0.7 0.3 - 1.2 mg/dL   GFR, Estimated >20 >25 mL/min    Comment: (NOTE) Calculated using the CKD-EPI Creatinine Equation (2021)    Anion gap 9 5 - 15    Comment: Performed at [facility], [address] [place], [place] [address]  CBC     Status: Abnormal   Collection Time: 04/30/22  1:17 AM  Result Value Ref Range   WBC 10.8 (H) 4.0 - 10.5 K/uL   RBC 2.58 (L) 3.87 - 5.11 MIL/uL   Hemoglobin 7.5 (L) 12.0 - 15.0 g/dL   HCT 23.7 (L) 62.8 - 31.5 %   MCV 91.5 80.0 - 100.0 fL   MCH 29.1 26.0 - 34.0 pg   MCHC 31.8 30.0 - 36.0 g/dL   RDW 17.6 16.0 - 73.7 %   Platelets 447 (H) 150 - 400 K/uL   nRBC 0.0 0.0 -  0.2 %    Comment: Performed at [facility], [address], [place], [place] [address]  Protime-INR     Status: Abnormal   Collection Time: 04/30/22  1:17 AM  Result Value Ref Range   Prothrombin Time 15.7 (H) 11.4 - 15.2 seconds   INR 1.3 (H) 0.8 - 1.2    Comment: (NOTE) INR goal varies based on device and disease states. Performed at [facility], [address], [place], [place] [address]   APTT     Status: None   Collection Time: 04/30/22  1:17 AM  Result Value Ref Range   aPTT 34 24 - 36 seconds    Comment: Performed at [facility], [address] [place], [place] [address]   CT CHEST ABDOMEN PELVIS W CONTRAST  Result Date: 04/29/2022 CLINICAL DATA:  Sepsis, recent gallbladder surgery on 08/22, JP drain EXAM: CT CHEST, ABDOMEN, AND PELVIS WITH CONTRAST TECHNIQUE: Multidetector CT imaging of the chest, abdomen and pelvis was performed following the standard protocol during bolus administration of intravenous contrast. RADIATION DOSE REDUCTION: This exam was performed according to the departmental dose-optimization program which includes automated exposure control, adjustment of the mA and/or kV according to patient size and/or use of iterative reconstruction technique. CONTRAST:  OMNIPAQUE IOHEXOL 300 MG/ML  SOLN COMPARISON:  CT abdomen/pelvis dated 02/04/2022. CTA chest dated 12/27/2016. FINDINGS: CT CHEST FINDINGS Cardiovascular: The heart is normal in size. No pericardial effusion. No evidence of thoracic aortic aneurysm. Mild atherosclerotic calcifications of the aortic arch. Mediastinum/Nodes: No suspicious mediastinal lymphadenopathy. Enlarged thyroid with multiple bilateral thyroid nodules, measuring up to 12 mm in the left isthmus (series 2/image 6), suggesting multinodular goiter. Not clinically significant; no follow-up imaging recommended (ref: J Am Coll Radiol. 2015 Feb;12(2): 143-50). Lungs/Pleura: Platelike opacity in the right lower lobe, favoring  atelectasis. Eventration of the right hemidiaphragm with associated right middle lobe atelectasis. Minimal left lower lobe atelectasis. No focal consolidation. No suspicious pulmonary nodules. No pleural effusion or pneumothorax. Musculoskeletal: Mild degenerative changes of the mid thoracic spine. CT ABDOMEN PELVIS FINDINGS Hepatobiliary: Liver is within normal limits. Status post cholecystectomy. 4.9  x 2.7 cm fluid and gas collection in the gallbladder fossa (series 2/image 59) which appears to communicate with the common duct and therefore favors a dilated cystic duct stump. Indwelling common duct stent with mild central pneumobilia. Right upper quadrant surgical drain terminates beneath the right paramidline anterior abdominal wall (series 2/image 78), without associated fluid in this region. Pancreas: Within normal limits. Spleen: Within normal limits. Adrenals/Urinary Tract: Adrenal glands are within normal limits. Kidneys are within normal limits.  No hydronephrosis. Bladder is mildly thick-walled although underdistended. Stomach/Bowel: Stomach is within normal limits. No evidence of bowel obstruction. Normal appendix (series 2/image 85). No colonic wall thickening or inflammatory changes. Vascular/Lymphatic: No evidence of abdominal aortic aneurysm. Atherosclerotic calcifications of the abdominal aorta and branch vessels. No suspicious abdominopelvic lymphadenopathy. Reproductive: Uterus is within normal limits. Bilateral ovaries are grossly unremarkable. Other: 5.1 x 4.1 cm complex fluid collection in the right pelvic cul-de-sac (series 2/image 98), along the right aspect of the rectum, suspicious for postsurgical fluid/abscess. No free air. Musculoskeletal: Status post PLIF at L4-5. Degenerative changes of the lumbar spine. IMPRESSION: Status post cholecystectomy. 4.9 x 2.7 cm fluid and gas collection in the gallbladder fossa, favoring a dilated cystic duct stump. Indwelling common duct stent with mild  central pneumobilia. 5.1 x 4.1 cm complex fluid collection in the right pelvic cul-de-sac, suspicious for postsurgical fluid/abscess. Right lower lobe and right middle lobe atelectasis. No evidence of acute cardiopulmonary disease. Electronically Signed   By: Charline Bills M.D.   On: 04/29/2022 19:43   CT Soft Tissue Neck W Contrast  Result Date: 04/29/2022 CLINICAL DATA:  Esophagitis. Difficulty swallowing subsequent to gallbladder surgery. EXAM: CT NECK WITH CONTRAST TECHNIQUE: Multidetector CT imaging of the neck was performed using the standard protocol following the bolus administration of intravenous contrast. RADIATION DOSE REDUCTION: This exam was performed according to the departmental dose-optimization program which includes automated exposure control, adjustment of the mA and/or kV according to patient size and/or use of iterative reconstruction technique. CONTRAST:  OMNIPAQUE IOHEXOL 300 MG/ML  SOLN COMPARISON:  None FINDINGS: Pharynx and larynx: No mucosal or submucosal abnormality seen in the neck. Cervical esophagus appears unremarkable. See results of chest CT. Salivary glands: Parotid and submandibular glands are normal. Thyroid: Enlarged and heterogeneous thyroid gland. Lymph nodes: No lymphadenopathy on either side of the neck. Vascular: No significant vascular finding. Limited intracranial: Normal Visualized orbits: Normal Mastoids and visualized paranasal sinuses: Clear Skeleton: Ordinary cervical spondylosis. Upper chest: See results of chest CT. Other: None IMPRESSION: No abnormality seen to explain difficulty swallowing based on the neck CT. Enlarged and heterogeneous thyroid gland. Recommend thyroid ultrasound (ref: J Am Coll Radiol. 2015 Feb;12(2): 143-50). This does not appear large enough to have any gross affect upon the esophagus. Electronically Signed   By: Paulina Fusi M.D.   On: 04/29/2022 19:33   DG Chest Port 1 View  Result Date: 04/29/2022 CLINICAL DATA:   Questionable sepsis EXAM: PORTABLE CHEST 1 VIEW COMPARISON:  02/12/2022 FINDINGS: Low lung volumes. Right basilar opacity could reflect atelectasis or infiltrate. Linear densities at the left base compatible with atelectasis. Heart is normal size. No effusions or acute bony abnormality. IMPRESSION: Low lung volumes.  Right basilar atelectasis or infiltrate. Electronically Signed   By: Charlett Nose M.D.   On: 04/29/2022 18:36    Review of Systems  Blood pressure 93/65, pulse 64, temperature (!) 97.4 F (36.3 C), temperature source Oral, resp. rate 16, last menstrual period 04/26/2012, SpO2 96 %. Physical Exam  Constitutional:      Appearance: Normal appearance.  HENT:     Head: Normocephalic.     Mouth/Throat:     Mouth: Mucous membranes are moist.  Eyes:     General: No scleral icterus.    Pupils: Pupils are equal, round, and reactive to light.  Cardiovascular:     Rate and Rhythm: Normal rate and regular rhythm.  Pulmonary:     Effort: Pulmonary effort is normal.     Breath sounds: Normal breath sounds.  Abdominal:     General: Abdomen is flat.     Palpations: Abdomen is soft.     Comments: Drain tan, no sig tenderness  Musculoskeletal:     Cervical back: Neck supple.  Skin:    General: Skin is warm.  Neurological:     Mental Status: She is alert and oriented to person, place, and time.  Psychiatric:        Mood and Affect: Mood normal.      Assessment/Plan H/o cholecystostomy tube 6/23 on DOW  Interval cholecystectomy 8/22 by Dr. Janee Mornhompson and known CBD stones.  Difficult lap chole -  lap loa x 60min, subtotal cholecystectomy, drain  ERCP/sphinterotomy/stent 8/23 by Dr. Russella DarStark   Readmitted with FTT  GB fossa small collection with drain which is working. Pelvic collection small and heterogeneous. Plan IV Zosyn, soft diet, IVF, PT, labs in AM  VTE - LMWH FEN - replete hypokalemia ID - Zosyn Liz MaladyBurke E Giann Obara, MD 04/30/2022, 7:52 AM

## 2022-04-30 NOTE — Progress Notes (Addendum)
Physical Therapy Treatment Patient Details Name: Martha Buck MRN: [id number] DOB: 14-Jul-1961 Today's Date: 04/30/2022   History of Present Illness Pt is a 61 y.o. F who presents 04/29/2022 with FTT, pain, and trouble swallowing.   Recent admission 04/08/22 for laparoscopic subtotal cholecystectomy. S/p ERCP on 8/23. Recent hospitalization 6/28-7/3 for severe acute cholecystitis with percutaneous cholecystectomy tube on 6/29.    PT Comments    Pt admitted with above. She appears to be fairly close to her functional baseline. Ambulating 300 ft with a walker at a supervision level, HR 90's. 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 PTA, pt ambulates with a RW and has a PCA 7 days/wk. Will continue to follow acutely to promote mobility while inpatient.     Recommendations for follow up therapy are one component of a multi-disciplinary discharge planning process, led by the attending physician.  Recommendations may be updated based on patient status, additional functional criteria and insurance authorization.  Follow Up Recommendations  No PT follow up     Assistance Recommended at Discharge PRN  Patient can return home with the following Assist for transportation   Equipment Recommendations  None recommended by PT    Recommendations for Other Services       Precautions / Restrictions Precautions Precautions: Fall Precaution Comments: JP drain on R side Restrictions Weight Bearing Restrictions: No     Mobility  Bed Mobility Overal bed mobility: Modified Independent                  Transfers Overall transfer level: Needs assistance Equipment used: Rolling walker (2 wheels) Transfers: Sit to/from Stand Sit to Stand: Supervision                Ambulation/Gait Ambulation/Gait assistance: Supervision Gait Distance (Feet): 300 Feet Assistive device: Rolling walker (2 wheels) Gait Pattern/deviations: Step-to pattern,  Decreased stride length, Knee flexed in stance - right Gait velocity: decreased     General Gait Details: supervision for safety. Baseline gait quality with R knee flexed throughout   Stairs             Wheelchair Mobility    Modified Rankin (Stroke Patients Only)       Balance Overall balance assessment: Needs assistance Sitting-balance support: No upper extremity supported, Feet supported Sitting balance-Leahy Scale: Good     Standing balance support: Bilateral upper extremity supported, Reliant on assistive device for balance Standing balance-Leahy Scale: Poor Standing balance comment: reliant on RW for support                            Cognition Arousal/Alertness: Awake/alert Behavior During Therapy: WFL for tasks assessed/performed Overall Cognitive Status: Within Functional Limits for tasks assessed                                          Exercises      General Comments        Pertinent Vitals/Pain Pain Assessment Pain Assessment: Faces Faces Pain Scale: Hurts little more Pain Location: R abdomen; IV site Pain Descriptors / Indicators: Discomfort, Guarding Pain Intervention(s): Monitored during session    Home Living Family/patient expects to be discharged to:: Private residence Living Arrangements: Children (daughter) Available Help at Discharge: Personal care attendant;Family Type of Home: House Home Access: Stairs to enter Entrance Stairs-Rails: Right  Entrance Stairs-Number of Steps: 3   Home Layout: One level Home Equipment: Agricultural consultant (2 wheels) Additional Comments: PCA comes 7 days/week for 3 hours/day    Prior Function            PT Goals (current goals can now be found in the care plan section) Acute Rehab PT Goals Patient Stated Goal: to go home and get better PT Goal Formulation: With patient Time For Goal Achievement: 05/14/22 Potential to Achieve Goals: Good    Frequency    Min  3X/week      PT Plan      Co-evaluation              AM-PAC PT "6 Clicks" Mobility   Outcome Measure  Help needed turning from your back to your side while in a flat bed without using bedrails?: None Help needed moving from lying on your back to sitting on the side of a flat bed without using bedrails?: None Help needed moving to and from a bed to a chair (including a wheelchair)?: A Little Help needed standing up from a chair using your arms (e.g., wheelchair or bedside chair)?: A Little Help needed to walk in hospital room?: A Little Help needed climbing 3-5 steps with a railing? : A Little 6 Click Score: 20    End of Session   Activity Tolerance: Patient tolerated treatment well Patient left: in bed;with call bell/phone within reach;Other (comment) (sitting EOB) Nurse Communication: Mobility status PT Visit Diagnosis: Unsteadiness on feet (R26.81);Muscle weakness (generalized) (M62.81);Difficulty in walking, not elsewhere classified (R26.2)     Time: 2025-4270 PT Time Calculation (min) (ACUTE ONLY): 27 min  Charges:  $Therapeutic Activity: 8-22 mins                     Lillia Pauls, PT, DPT Acute Rehabilitation Services Office [phone]    Norval Morton 04/30/2022, 1:30 PM

## 2022-05-01 LAB — CBC
HCT: 25.2 % — ABNORMAL LOW (ref 36.0–46.0)
Hemoglobin: 8 g/dL — ABNORMAL LOW (ref 12.0–15.0)
MCH: 29 pg (ref 26.0–34.0)
MCHC: 31.7 g/dL (ref 30.0–36.0)
MCV: 91.3 fL (ref 80.0–100.0)
Platelets: 433 10*3/uL — ABNORMAL HIGH (ref 150–400)
RBC: 2.76 MIL/uL — ABNORMAL LOW (ref 3.87–5.11)
RDW: 14.4 % (ref 11.5–15.5)
WBC: 11.8 10*3/uL — ABNORMAL HIGH (ref 4.0–10.5)
nRBC: 0 % (ref 0.0–0.2)

## 2022-05-01 LAB — BASIC METABOLIC PANEL
Anion gap: 5 (ref 5–15)
BUN: 6 mg/dL (ref 6–20)
CO2: 24 mmol/L (ref 22–32)
Calcium: 7.9 mg/dL — ABNORMAL LOW (ref 8.9–10.3)
Chloride: 108 mmol/L (ref 98–111)
Creatinine, Ser: 0.71 mg/dL (ref 0.44–1.00)
GFR, Estimated: >60 mL/min (ref >60)
Glucose, Bld: 94 mg/dL (ref 70–99)
Potassium: 4 mmol/L (ref 3.5–5.1)
Sodium: 137 mmol/L (ref 135–145)

## 2022-05-01 MED ORDER — DULOXETINE HCL 60 MG PO CPEP
60.0000 mg | ORAL_CAPSULE | Freq: Every day | ORAL | Status: DC
  Administered 2022-05-01: 60 mg via ORAL
  Filled 2022-05-01: qty 1

## 2022-05-01 MED ORDER — PANTOPRAZOLE SODIUM 40 MG PO TBEC
40.0000 mg | DELAYED_RELEASE_TABLET | Freq: Every day | ORAL | Status: DC
  Administered 2022-05-01: 40 mg via ORAL
  Filled 2022-05-01: qty 1

## 2022-05-01 MED ORDER — LEVOTHYROXINE SODIUM 25 MCG PO TABS
25.0000 ug | ORAL_TABLET | Freq: Every day | ORAL | Status: DC
  Administered 2022-05-01 – 2022-05-02 (×2): 25 ug via ORAL
  Filled 2022-05-01 (×2): qty 1

## 2022-05-01 MED ORDER — GABAPENTIN 400 MG PO CAPS
800.0000 mg | ORAL_CAPSULE | Freq: Three times a day (TID) | ORAL | Status: DC
  Administered 2022-05-01 – 2022-05-02 (×4): 800 mg via ORAL
  Filled 2022-05-01 (×4): qty 2

## 2022-05-01 NOTE — Progress Notes (Signed)
Subjective: Doesn't like food.  Otherwise feeling ok.  Wants some of her home meds.  ROS: See above, otherwise other systems negative  Objective: Vital signs in last 24 hours: Temp:  [97.6 F (36.4 C)-98.7 F (37.1 C)] 98.7 F (37.1 C) (09/14 0849) Pulse Rate:  [71-80] 80 (09/14 0849) Resp:  [16-18] 18 (09/14 0849) BP: (101-121)/(60-88) 121/69 (09/14 0849) SpO2:  [97 %-100 %] 98 % (09/14 0849)    Intake/Output from previous day: 09/13 0701 - 09/14 0700 In: 410 [P.O.:360; IV Piggyback:50] Out: 15 [Drains:15] Intake/Output this shift: No intake/output data recorded.  PE: Gen: NAD, laying in bed Abd: soft, minimally tender around her drain, JP with slightly cloudy serous output, incisions are well healing.  Lab Results:  Recent Labs    04/29/22 1738 04/29/22 1810 04/30/22 0117  WBC 15.3*  --  10.8*  HGB 9.2* 10.2* 7.5*  HCT 29.1* 30.0* 23.6*  PLT 549*  --  447*   BMET Recent Labs    04/29/22 1738 04/29/22 1810 04/30/22 0117  NA 139 139 137  K 3.8 4.2 3.2*  CL 107 106 104  CO2 23  --  24  GLUCOSE 111* 106* 106*  BUN 12 11 9   CREATININE 0.70 0.60 0.66  CALCIUM 8.3*  --  8.1*   PT/INR Recent Labs    04/29/22 1738 04/30/22 0117  LABPROT 15.1 15.7*  INR 1.2 1.3*   CMP     Component Value Date/Time   NA 137 04/30/2022 0117   K 3.2 (L) 04/30/2022 0117   CL 104 04/30/2022 0117   CO2 24 04/30/2022 0117   GLUCOSE 106 (H) 04/30/2022 0117   BUN 9 04/30/2022 0117   CREATININE 0.66 04/30/2022 0117   CALCIUM 8.1 (L) 04/30/2022 0117   PROT 6.2 (L) 04/30/2022 0117   ALBUMIN 2.2 (L) 04/30/2022 0117   AST 15 04/30/2022 0117   ALT 16 04/30/2022 0117   ALKPHOS 81 04/30/2022 0117   BILITOT 0.7 04/30/2022 0117   GFRNONAA >60 04/30/2022 0117   GFRAA >60 03/01/2019 1718   Lipase     Component Value Date/Time   LIPASE 19 02/12/2022 1740       Studies/Results: CT CHEST ABDOMEN PELVIS W CONTRAST  Result Date: 04/29/2022 CLINICAL DATA:  Sepsis,  recent gallbladder surgery on 08/22, JP drain EXAM: CT CHEST, ABDOMEN, AND PELVIS WITH CONTRAST TECHNIQUE: Multidetector CT imaging of the chest, abdomen and pelvis was performed following the standard protocol during bolus administration of intravenous contrast. RADIATION DOSE REDUCTION: This exam was performed according to the departmental dose-optimization program which includes automated exposure control, adjustment of the mA and/or kV according to patient size and/or use of iterative reconstruction technique. CONTRAST:  9/22 OMNIPAQUE IOHEXOL 300 MG/ML  SOLN COMPARISON:  CT abdomen/pelvis dated 02/04/2022. CTA chest dated 12/27/2016. FINDINGS: CT CHEST FINDINGS Cardiovascular: The heart is normal in size. No pericardial effusion. No evidence of thoracic aortic aneurysm. Mild atherosclerotic calcifications of the aortic arch. Mediastinum/Nodes: No suspicious mediastinal lymphadenopathy. Enlarged thyroid with multiple bilateral thyroid nodules, measuring up to 12 mm in the left isthmus (series 2/image 6), suggesting multinodular goiter. Not clinically significant; no follow-up imaging recommended (ref: J Am Coll Radiol. 2015 Feb;12(2): 143-50). Lungs/Pleura: Platelike opacity in the right lower lobe, favoring atelectasis. Eventration of the right hemidiaphragm with associated right middle lobe atelectasis. Minimal left lower lobe atelectasis. No focal consolidation. No suspicious pulmonary nodules. No pleural effusion or pneumothorax. Musculoskeletal: Mild degenerative changes of the mid thoracic  spine. CT ABDOMEN PELVIS FINDINGS Hepatobiliary: Liver is within normal limits. Status post cholecystectomy. 4.9 x 2.7 cm fluid and gas collection in the gallbladder fossa (series 2/image 59) which appears to communicate with the common duct and therefore favors a dilated cystic duct stump. Indwelling common duct stent with mild central pneumobilia. Right upper quadrant surgical drain terminates beneath the right  paramidline anterior abdominal wall (series 2/image 78), without associated fluid in this region. Pancreas: Within normal limits. Spleen: Within normal limits. Adrenals/Urinary Tract: Adrenal glands are within normal limits. Kidneys are within normal limits.  No hydronephrosis. Bladder is mildly thick-walled although underdistended. Stomach/Bowel: Stomach is within normal limits. No evidence of bowel obstruction. Normal appendix (series 2/image 85). No colonic wall thickening or inflammatory changes. Vascular/Lymphatic: No evidence of abdominal aortic aneurysm. Atherosclerotic calcifications of the abdominal aorta and branch vessels. No suspicious abdominopelvic lymphadenopathy. Reproductive: Uterus is within normal limits. Bilateral ovaries are grossly unremarkable. Other: 5.1 x 4.1 cm complex fluid collection in the right pelvic cul-de-sac (series 2/image 98), along the right aspect of the rectum, suspicious for postsurgical fluid/abscess. No free air. Musculoskeletal: Status post PLIF at L4-5. Degenerative changes of the lumbar spine. IMPRESSION: Status post cholecystectomy. 4.9 x 2.7 cm fluid and gas collection in the gallbladder fossa, favoring a dilated cystic duct stump. Indwelling common duct stent with mild central pneumobilia. 5.1 x 4.1 cm complex fluid collection in the right pelvic cul-de-sac, suspicious for postsurgical fluid/abscess. Right lower lobe and right middle lobe atelectasis. No evidence of acute cardiopulmonary disease. Electronically Signed   By: Charline Bills M.D.   On: 04/29/2022 19:43   CT Soft Tissue Neck W Contrast  Result Date: 04/29/2022 CLINICAL DATA:  Esophagitis. Difficulty swallowing subsequent to gallbladder surgery. EXAM: CT NECK WITH CONTRAST TECHNIQUE: Multidetector CT imaging of the neck was performed using the standard protocol following the bolus administration of intravenous contrast. RADIATION DOSE REDUCTION: This exam was performed according to the departmental  dose-optimization program which includes automated exposure control, adjustment of the mA and/or kV according to patient size and/or use of iterative reconstruction technique. CONTRAST:  OMNIPAQUE IOHEXOL 300 MG/ML  SOLN COMPARISON:  None FINDINGS: Pharynx and larynx: No mucosal or submucosal abnormality seen in the neck. Cervical esophagus appears unremarkable. See results of chest CT. Salivary glands: Parotid and submandibular glands are normal. Thyroid: Enlarged and heterogeneous thyroid gland. Lymph nodes: No lymphadenopathy on either side of the neck. Vascular: No significant vascular finding. Limited intracranial: Normal Visualized orbits: Normal Mastoids and visualized paranasal sinuses: Clear Skeleton: Ordinary cervical spondylosis. Upper chest: See results of chest CT. Other: None IMPRESSION: No abnormality seen to explain difficulty swallowing based on the neck CT. Enlarged and heterogeneous thyroid gland. Recommend thyroid ultrasound (ref: J Am Coll Radiol. 2015 Feb;12(2): 143-50). This does not appear large enough to have any gross affect upon the esophagus. Electronically Signed   By: Paulina Fusi M.D.   On: 04/29/2022 19:33   DG Chest Port 1 View  Result Date: 04/29/2022 CLINICAL DATA:  Questionable sepsis EXAM: PORTABLE CHEST 1 VIEW COMPARISON:  02/12/2022 FINDINGS: Low lung volumes. Right basilar opacity could reflect atelectasis or infiltrate. Linear densities at the left base compatible with atelectasis. Heart is normal size. No effusions or acute bony abnormality. IMPRESSION: Low lung volumes.  Right basilar atelectasis or infiltrate. Electronically Signed   By: Charlett Nose M.D.   On: 04/29/2022 18:36    Anti-infectives: Anti-infectives (From admission, onward)    Start     Dose/Rate Route  Frequency Ordered Stop   04/30/22 0600  piperacillin-tazobactam (ZOSYN) IVPB 3.375 g        3.375 g 12.5 mL/hr over 240 Minutes Intravenous Every 8 hours 04/29/22 2237     04/29/22 2045   piperacillin-tazobactam (ZOSYN) IVPB 3.375 g        3.375 g 100 mL/hr over 30 Minutes Intravenous  Once 04/29/22 2039 04/29/22 2204        Assessment/Plan H/o cholecystostomy tube 6/23 on DOW  Interval cholecystectomy 8/22 by Dr. Janee Morn and known CBD stones.  Difficult lap chole -  lap loa x , subtotal cholecystectomy, drain  ERCP/sphinterotomy/stent 8/23 by Dr. Russella Dar  FTT with small gb fossa and pelvic fluid collection -cont abx therapy and surgical drain.  Patient seems to have not been charging or doing anything with her drain at home.  It appears to be working.  Pelvic collection too small to drain. -cont zosyn and hydration -PT saw her and states no PT follow up but will need assist with transportation.  She does have a daughter.  FEN - D3/IVFs at 50cc/hr, hypokalemia yesterday, recheck labs today and in am VTE - lovenox ID - zosyn  Depression - resume home med Hypothyroidism - resume synthroid   LOS: 2 days    Letha Cape , [facility] 05/01/2022, 10:04 AM Please see Amion for pager number during day hours 7:00am-4:30pm or 7:00am -11:30am on weekends

## 2022-05-01 NOTE — Progress Notes (Signed)
Mobility Specialist - Progress Note   05/01/22 0941  Mobility  Activity Ambulated with assistance in hallway  Level of Assistance Standby assist, set-up cues, supervision of patient - no hands on  Assistive Device Front wheel walker  Distance Ambulated (ft) 300 ft  Activity Response Tolerated well  $Mobility charge 1 Mobility    Pt received in bed agreeable to mobility. No physical assistance needed. Left in bed w/ call bell in reach and all needs met.   Paulla Dolly Mobility Specialist

## 2022-05-01 NOTE — TOC Initial Note (Signed)
Transition of Care [facility]) - Initial/Assessment Note    Patient Details  Name: Martha Buck MRN: [id number] Date of Birth: 1961/02/18  [facility]) CM/SW Contact:    Kingsley Plan, RN Phone Number: 05/01/2022, 11:41 AM  Clinical Narrative:                 Spoke to patient at bedside.   From home alone , states sometimes her children stay with her.   She has aide every day for 2.5 hours,.   Her daughter went to [facility] and brought her car to [facility]. Sometimes she does need assistance with transportation. NCM provided Medicaid Transportation information for patient.   Patient will be taught drain care prior to leaving hospital.   patient says her walker was left at [facility] when she was transferred here . I called [facility] spoke to [place] in security. Shawn found walker , security will bring walker to 6n30 today. Patient aware    Expected Discharge Plan: Home/Self Care Barriers to Discharge: Continued Medical Work up   Patient Goals and CMS Choice Patient states their goals for this hospitalization and ongoing recovery are:: to return to home      Expected Discharge Plan and Services Expected Discharge Plan: Home/Self Care       Living arrangements for the past 2 months: Single Family Home                 DME Arranged: N/A DME Agency: NA       HH Arranged: NA          Prior Living Arrangements/Services Living arrangements for the past 2 months: Single Family Home Lives with::  (states children come and go) Patient language and need for interpreter reviewed:: Yes Do you feel safe going back to the place where you live?: Yes      Need for Family Participation in Patient Care: Yes (Comment) Care giver support system in place?: Yes (comment) Current home services: DME Criminal Activity/Legal Involvement Pertinent to Current Situation/Hospitalization: No - Comment as needed  Activities of Daily Living Home Assistive Devices/Equipment: Walker  (specify type) ADL Screening (condition at time of admission) Patient's cognitive ability adequate to safely complete daily activities?: No Is the patient deaf or have difficulty hearing?: No Does the patient have difficulty seeing, even when wearing glasses/contacts?: No Does the patient have difficulty concentrating, remembering, or making decisions?: Yes Patient able to express need for assistance with ADLs?: Yes Does the patient have difficulty dressing or bathing?: Yes Independently performs ADLs?: No Communication: Appropriate for developmental age, Independent Dressing (OT): Needs assistance Is this a change from baseline?: Pre-admission baseline Grooming: Independent Feeding: Independent Bathing: Needs assistance Is this a change from baseline?: Pre-admission baseline Toileting: Needs assistance Is this a change from baseline?: Pre-admission baseline In/Out Bed: Needs assistance Is this a change from baseline?: Pre-admission baseline Walks in Home: Needs assistance Is this a change from baseline?: Pre-admission baseline Does the patient have difficulty walking or climbing stairs?: Yes Weakness of Legs: Both Weakness of Arms/Hands: None  Permission Sought/Granted   Permission granted to share information with : No              Emotional Assessment Appearance:: Appears stated age Attitude/Demeanor/Rapport: Engaged Affect (typically observed): Accepting Orientation: : Oriented to Self, Oriented to Place, Oriented to  Time, Oriented to Situation Alcohol / Substance Use: Not Applicable Psych Involvement: No (comment)  Admission diagnosis:  Abdominal fluid collection [R18.8] Patient Active Problem List  Diagnosis Date Noted   Abdominal fluid collection 04/29/2022   Bile leak, postoperative    S/P laparoscopic cholecystectomy 04/08/2022   Acute cholecystitis 02/13/2022   Primary osteoarthritis of left knee 12/06/2020   Primary localized osteoarthritis of right knee  11/12/2016   S/P lumbar fusion 10/20/2016   Primary osteoarthritis of right knee 10/20/2016   Cholelithiasis 09/14/2015   Pneumonia 09/14/2015   Emesis    Community acquired pneumonia 09/13/2015   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 [place] [address] Phone: [phone] Fax: [phone]  [facility] [address] [place] [address] Phone: [phone] Fax: [phone]     Social Determinants of Health (SDOH) Interventions    Readmission Risk Interventions     No data to display

## 2022-05-02 ENCOUNTER — Other Ambulatory Visit (HOSPITAL_COMMUNITY): Payer: Self-pay

## 2022-05-02 LAB — BASIC METABOLIC PANEL
Anion gap: 8 (ref 5–15)
BUN: 7 mg/dL (ref 6–20)
CO2: 25 mmol/L (ref 22–32)
Calcium: 8.1 mg/dL — ABNORMAL LOW (ref 8.9–10.3)
Chloride: 109 mmol/L (ref 98–111)
Creatinine, Ser: 0.73 mg/dL (ref 0.44–1.00)
GFR, Estimated: >60 mL/min (ref >60)
Glucose, Bld: 113 mg/dL — ABNORMAL HIGH (ref 70–99)
Potassium: 3.8 mmol/L (ref 3.5–5.1)
Sodium: 142 mmol/L (ref 135–145)

## 2022-05-02 LAB — CBC
HCT: 23.6 % — ABNORMAL LOW (ref 36.0–46.0)
Hemoglobin: 7.8 g/dL — ABNORMAL LOW (ref 12.0–15.0)
MCH: 29.5 pg (ref 26.0–34.0)
MCHC: 33.1 g/dL (ref 30.0–36.0)
MCV: 89.4 fL (ref 80.0–100.0)
Platelets: 403 10*3/uL — ABNORMAL HIGH (ref 150–400)
RBC: 2.64 MIL/uL — ABNORMAL LOW (ref 3.87–5.11)
RDW: 14.5 % (ref 11.5–15.5)
WBC: 11.7 10*3/uL — ABNORMAL HIGH (ref 4.0–10.5)
nRBC: 0 % (ref 0.0–0.2)

## 2022-05-02 MED ORDER — AMOXICILLIN-POT CLAVULANATE 875-125 MG PO TABS
1.0000 | ORAL_TABLET | Freq: Two times a day (BID) | ORAL | 0 refills | Status: AC
  Filled 2022-05-02: qty 14, 7d supply, fill #0

## 2022-05-02 NOTE — Discharge Summary (Signed)
[name] [id number] 1960/11/24 61 y.o.  Admit date: 04/29/2022 Discharge date: 05/02/2022  Admitting Diagnosis: Weakness, FTT Gallbladder fossa fluid collection  Discharge Diagnosis Patient Active Problem List   Diagnosis Date Noted   Abdominal fluid collection 04/29/2022   Bile leak, postoperative    S/P laparoscopic cholecystectomy 04/08/2022   Acute cholecystitis 02/13/2022   Primary osteoarthritis of left knee 12/06/2020   Primary localized osteoarthritis of right knee 11/12/2016   S/P lumbar fusion 10/20/2016   Primary osteoarthritis of right knee 10/20/2016   Cholelithiasis 09/14/2015   Pneumonia 09/14/2015   Emesis    Community acquired pneumonia 09/13/2015   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 Grandville Silos and known CBD stones.  Difficult lap chole -  lap loa x 72mn, subtotal cholecystectomy, drain  ERCP/sphinterotomy/stent 8/23 by Dr. SPat Patrickto ED 9/12 with FTT, pain, trouble swallowing x 3 days. 2 fluid collections - GB fossa - looks like surgical drain goes into it; other one is pelvis.  Picture of drain in media - doesn't look like bile. LFTs ok. Wbc 15. (Was 15 on discharge)     Reports this AM that swallowing feels better.  Procedures none  Hospital Course:  The patient was admitted and started on IV zosyn.  These fluid collections noted on imaging are small and not drainable.  The one in the gb fossa is likely there as patient was not charging and managing her drain at home.  This was done here and this began to drain appropriately.  She was able to swallow and was eating well.  She was seen by therapies who recommended no follow up.  Her WBC down trended and was 11 from 15 at the time  of discharge.  She was stable for DC home with her drain in place and on 7 days of Augmentin.  Follow up arranged as well.  Further education provided regarding adequate drain care at home.  Physical Exam: Gen: NAD, laying in bed, asking to go home Abd: soft, minimally tender around her drain, JP with cloudy somewhat purulent appearing output, incisions are well healing.  Allergies as of 05/02/2022       Reactions   Bee Venom Anaphylaxis   Aspirin Itching   Lyrica [pregabalin] Rash, Other (See Comments)   Pt. States that she breaks out in hives when she takes Lyrica.    Morphine And Related Itching        Medication List     TAKE these medications    acetaminophen 500 MG tablet Commonly known as: TYLENOL Take 2 tablets (1,000 mg total) by mouth every 6 (six) hours as needed for mild pain or fever.   amoxicillin-clavulanate 875-125 MG tablet Commonly known as: AUGMENTIN Take 1 tablet by mouth 2 (two) times daily for 7 days.   DULoxetine 60 MG capsule Commonly known as: CYMBALTA Take 60 mg by mouth at bedtime.   fluticasone 50 MCG/ACT nasal spray Commonly known as: FLONASE Place 1-2 sprays into both nostrils daily. What changed:  when to take this reasons to take this   gabapentin 400 MG capsule Commonly known as: NEURONTIN Take 2 capsules (800 mg total) by mouth 3 (three) times daily.   levothyroxine 25 MCG tablet  Commonly known as: SYNTHROID Take 25 mcg by mouth daily.   naloxone 4 MG/0.1ML Liqd nasal spray kit Commonly known as: NARCAN Place 1 spray into the nose once as needed (opiod overdose).   oxyCODONE 15 MG immediate release tablet Commonly known as: ROXICODONE Take 15 mg by mouth 4 (four) times daily as needed for severe pain.   pravastatin 10 MG tablet Commonly known as: PRAVACHOL Take 10 mg by mouth daily.   sodium chloride flush 0.9 % Soln injection Use 5 mLs by Intracatheter route daily.   traMADol 50 MG tablet Commonly known as:  ULTRAM Take 1 tablet (50 mg total) by mouth every 6 (six) hours as needed for severe pain.   Vitamin D (Ergocalciferol) 1.25 MG (50000 UNIT) Caps capsule Commonly known as: DRISDOL Take 50,000 Units by mouth every 7 (seven) days.          Follow-up Information     Georganna Skeans, MD Follow up on 05/07/2022.   Specialty: General Surgery Why: 9:20am, arrive 15 minutes prior to your appointment time. Contact information: [address] [place] [address] [phone]                 Signed: Saverio Danker, [facility] 05/02/2022, 10:22 AM Please see Amion for pager number during day hours 7:00am-4:30pm, 7-11:30am on Weekends

## 2022-05-02 NOTE — Care Management Important Message (Signed)
Important Message  Patient Details  Name: Martha Buck MRN: [id number] Date of Birth: Jan 31, 1961   Medicare Important Message Given:  Yes     Sherilyn Banker 05/02/2022, 12:20 PM

## 2022-05-02 NOTE — Progress Notes (Signed)
Mobility Specialist - Progress Note   05/02/22 1054  Mobility  Activity Ambulated with assistance in hallway  Level of Assistance Standby assist, set-up cues, supervision of patient - no hands on  Assistive Device Front wheel walker  Distance Ambulated (ft) 120 ft  Activity Response Tolerated well  $Mobility charge 1 Mobility    Pt received in bed agreeable to mobility. No physical assistance needed throughout, no complaints. Left in bed w/ call bell in reach and all needs met.   Paulla Dolly Mobility Specialist

## 2022-05-04 LAB — CULTURE, BLOOD (ROUTINE X 2)
Culture: NO GROWTH
Culture: NO GROWTH
Special Requests: ADEQUATE
Special Requests: ADEQUATE

## 2022-05-29 ENCOUNTER — Telehealth: Payer: Self-pay

## 2022-05-29 DIAGNOSIS — K838 Other specified diseases of biliary tract: Secondary | ICD-10-CM

## 2022-05-29 NOTE — Telephone Encounter (Signed)
Per Barbie Haggis, PV is not needed for ERCP. PV cancelled & patient is aware. Amb ref placed. No blood thinners or diabetic. Instructions mailed home.

## 2022-06-19 NOTE — Progress Notes (Signed)
Attempted to obtain medical history via telephone, unable to reach at this time. HIPAA compliant voicemail message left requesting return call to pre surgical testing department. 

## 2022-06-26 ENCOUNTER — Other Ambulatory Visit: Payer: Self-pay

## 2022-06-26 ENCOUNTER — Encounter (HOSPITAL_COMMUNITY)
Admission: RE | Disposition: A | Discharge status: Home / Self Care | Payer: Self-pay | Source: Home / Self Care | Attending: Gastroenterology

## 2022-06-26 ENCOUNTER — Encounter (HOSPITAL_COMMUNITY): Payer: Self-pay | Admitting: Gastroenterology

## 2022-06-26 ENCOUNTER — Ambulatory Visit (HOSPITAL_COMMUNITY): Payer: Medicare Other

## 2022-06-26 ENCOUNTER — Ambulatory Visit (HOSPITAL_COMMUNITY): Payer: Medicare Other | Admitting: Certified Registered"

## 2022-06-26 ENCOUNTER — Ambulatory Visit (HOSPITAL_COMMUNITY)
Admission: RE | Admit: 2022-06-26 | Discharge: 2022-06-26 | Disposition: A | Discharge status: Home / Self Care | Payer: Medicare Other | Attending: Gastroenterology | Admitting: Gastroenterology

## 2022-06-26 ENCOUNTER — Ambulatory Visit (HOSPITAL_BASED_OUTPATIENT_CLINIC_OR_DEPARTMENT_OTHER): Payer: Medicare Other | Admitting: Certified Registered"

## 2022-06-26 DIAGNOSIS — Z4659 Encounter for fitting and adjustment of other gastrointestinal appliance and device: Secondary | ICD-10-CM | POA: Insufficient documentation

## 2022-06-26 DIAGNOSIS — F17210 Nicotine dependence, cigarettes, uncomplicated: Secondary | ICD-10-CM | POA: Insufficient documentation

## 2022-06-26 DIAGNOSIS — M1990 Unspecified osteoarthritis, unspecified site: Secondary | ICD-10-CM | POA: Diagnosis not present

## 2022-06-26 DIAGNOSIS — K838 Other specified diseases of biliary tract: Secondary | ICD-10-CM | POA: Diagnosis not present

## 2022-06-26 DIAGNOSIS — F32A Depression, unspecified: Secondary | ICD-10-CM | POA: Diagnosis not present

## 2022-06-26 DIAGNOSIS — K839 Disease of biliary tract, unspecified: Secondary | ICD-10-CM | POA: Insufficient documentation

## 2022-06-26 DIAGNOSIS — Z9049 Acquired absence of other specified parts of digestive tract: Secondary | ICD-10-CM | POA: Diagnosis not present

## 2022-06-26 HISTORY — PX: GASTROINTESTINAL STENT REMOVAL: SHX6384

## 2022-06-26 HISTORY — PX: ENDOSCOPIC RETROGRADE CHOLANGIOPANCREATOGRAPHY (ERCP) WITH PROPOFOL: SHX5810

## 2022-06-26 SURGERY — ENDOSCOPIC RETROGRADE CHOLANGIOPANCREATOGRAPHY (ERCP) WITH PROPOFOL
Anesthesia: General

## 2022-06-26 MED ORDER — PROPOFOL 1000 MG/100ML IV EMUL
INTRAVENOUS | Status: AC
  Filled 2022-06-26: qty 100

## 2022-06-26 MED ORDER — SODIUM CHLORIDE 0.9 % IV SOLN
INTRAVENOUS | Status: DC | PRN
  Administered 2022-06-26: 5 mL

## 2022-06-26 MED ORDER — LIDOCAINE 2% (20 MG/ML) 5 ML SYRINGE
INTRAMUSCULAR | Status: DC | PRN
  Administered 2022-06-26: 40 mg via INTRAVENOUS

## 2022-06-26 MED ORDER — INDOMETHACIN 50 MG RE SUPP
RECTAL | Status: AC
  Filled 2022-06-26: qty 2

## 2022-06-26 MED ORDER — LACTATED RINGERS IV SOLN: INTRAVENOUS | Status: DC

## 2022-06-26 MED ORDER — SUCCINYLCHOLINE CHLORIDE 200 MG/10ML IV SOSY
PREFILLED_SYRINGE | INTRAVENOUS | Status: DC | PRN
  Administered 2022-06-26: 120 mg via INTRAVENOUS

## 2022-06-26 MED ORDER — GLUCAGON HCL RDNA (DIAGNOSTIC) 1 MG IJ SOLR
INTRAMUSCULAR | Status: DC | PRN
  Administered 2022-06-26: .25 mg via INTRAVENOUS

## 2022-06-26 MED ORDER — ONDANSETRON HCL 4 MG/2ML IJ SOLN
INTRAMUSCULAR | Status: DC | PRN
  Administered 2022-06-26: 4 mg via INTRAVENOUS

## 2022-06-26 MED ORDER — SODIUM CHLORIDE 0.9 % IV SOLN: INTRAVENOUS | Status: DC

## 2022-06-26 MED ORDER — CIPROFLOXACIN IN D5W 400 MG/200ML IV SOLN
INTRAVENOUS | Status: AC
  Filled 2022-06-26: qty 200

## 2022-06-26 MED ORDER — SUGAMMADEX SODIUM 200 MG/2ML IV SOLN
INTRAVENOUS | Status: DC | PRN
  Administered 2022-06-26: 200 mg via INTRAVENOUS

## 2022-06-26 MED ORDER — PROPOFOL 10 MG/ML IV BOLUS
INTRAVENOUS | Status: AC
  Filled 2022-06-26: qty 20

## 2022-06-26 MED ORDER — DEXAMETHASONE SODIUM PHOSPHATE 10 MG/ML IJ SOLN
INTRAMUSCULAR | Status: DC | PRN
  Administered 2022-06-26: 4 mg via INTRAVENOUS

## 2022-06-26 MED ORDER — SODIUM CHLORIDE 0.9 % IV SOLN
1.5000 g | Freq: Once | INTRAVENOUS | Status: AC
  Administered 2022-06-26: 1.5 g via INTRAVENOUS
  Filled 2022-06-26: qty 4

## 2022-06-26 MED ORDER — PHENYLEPHRINE 80 MCG/ML (10ML) SYRINGE FOR IV PUSH (FOR BLOOD PRESSURE SUPPORT)
PREFILLED_SYRINGE | INTRAVENOUS | Status: DC | PRN
  Administered 2022-06-26 (×4): 80 ug via INTRAVENOUS

## 2022-06-26 MED ORDER — FENTANYL CITRATE (PF) 100 MCG/2ML IJ SOLN
INTRAMUSCULAR | Status: AC
  Filled 2022-06-26: qty 2

## 2022-06-26 MED ORDER — GLUCAGON HCL RDNA (DIAGNOSTIC) 1 MG IJ SOLR
INTRAMUSCULAR | Status: AC
  Filled 2022-06-26: qty 1

## 2022-06-26 MED ORDER — ROCURONIUM BROMIDE 10 MG/ML (PF) SYRINGE
PREFILLED_SYRINGE | INTRAVENOUS | Status: DC | PRN
  Administered 2022-06-26: 50 mg via INTRAVENOUS

## 2022-06-26 MED ORDER — FENTANYL CITRATE (PF) 100 MCG/2ML IJ SOLN
INTRAMUSCULAR | Status: DC | PRN
  Administered 2022-06-26 (×2): 50 ug via INTRAVENOUS

## 2022-06-26 MED ORDER — PROPOFOL 10 MG/ML IV BOLUS
INTRAVENOUS | Status: DC | PRN
  Administered 2022-06-26: 30 mg via INTRAVENOUS
  Administered 2022-06-26: 170 mg via INTRAVENOUS

## 2022-06-26 NOTE — Op Note (Signed)
[facility] Patient Name: Martha Buck Procedure Date: 06/26/2022 MRN: [id number] Attending MD: Meryl Dare , MD, [phone] Date of Birth: 1961/05/12 CSN: [id number] Age: 61 Admit Type: Outpatient Procedure:                ERCP Indications:              Bile leak post subtotal cholecystectomy, Biliary                            stent removal Providers:                Venita Lick. Russella Dar, MD, Fransisca Connors, Melany Guernsey, Technician Referring MD:             Violeta Gelinas, MD Medicines:                General Anesthesia Complications:            No immediate complications. Estimated Blood Loss:     Estimated blood loss: none. Procedure:                Pre-Anesthesia Assessment:                           - Prior to the procedure, a History and Physical                            was performed, and patient medications and                            allergies were reviewed. The patient's tolerance of                            previous anesthesia was also reviewed. The risks                            and benefits of the procedure and the sedation                            options and risks were discussed with the patient.                            All questions were answered, and informed consent                            was obtained. Prior Anticoagulants: The patient has                            taken no anticoagulant or antiplatelet agents. ASA                            Grade Assessment: III - A patient with severe                            systemic  disease. After reviewing the risks and                            benefits, the patient was deemed in satisfactory                            condition to undergo the procedure.                           After obtaining informed consent, the scope was                            passed under direct vision. Throughout the                            procedure, the patient's blood  pressure, pulse, and                            oxygen saturations were monitored continuously. The                            [id number]) Olympus duodenoscope was                            introduced through the mouth, and used to inject                            contrast into and used to inject contrast into the                            bile duct. The ERCP was accomplished without                            difficulty. The patient tolerated the procedure                            well. Scope In: Scope Out: Findings:      A biliary stent was visible on the scout film. The scope was advanced to       the major papilla in the descending duodenum. Limited examination of the       pharynx, larynx and associated structures, and upper GI tract was normal       except for a prior sphinterotomy and an indwelling biliary stent. The       stent was removed. The bile duct was deeply cannulated with the 9-12 mm       balloon. Contrast was injected. I personally interpreted the bile duct       images. There was appropriate flow of contrast through the ducts. The       intrahepatic ducts were not well filled as contrast extravasated at the       gallbladder remnant. Extravasation of contrast originating from the       gallbladder remnant was observed with an occlusion cholangiogram. Prior       subtotal cholecystectomy. The biliary tree was otherwise normal. To       discover objects,  the biliary tree was swept with a 12 mm balloon       starting at the bifurcation. Nothing was found. Very good biliary       drainage was noted. The PD was not cannulated or injected by intention. Impression:               - Prior sphincterotomy.                           - Biliary stent removed.                           - Prior subtotal cholecystectomy.                           - A bile leak was found at the gallbladder remnant.                           - The biliary tree was swept and nothing was  found. Moderate Sedation:      Not Applicable - Patient had care per Anesthesia. Recommendation:           - Patient has a contact number available for                            emergencies. The signs and symptoms of potential                            delayed complications were discussed with the                            patient. Return to normal activities tomorrow.                            Written discharge instructions were provided to the                            patient.                           - Observe patient's clinical course following                            today's ERCP with therapeutic intervention.                           - Sphincterotomy should allow adequate biliary                            drainage, diverting bile from the gallbladder                            remnant.                           - If bile leak recurs plan for ERCP with stent  replacement.                           - Follow up with PCP regarding her leg wound.                           - Follow up with GI and Georganna Skeans, MD as                            needed. Procedure Code(s):        --- Professional ---                           [phone], Endoscopic retrograde                            cholangiopancreatography (ERCP); diagnostic,                            including collection of specimen(s) by brushing or                            washing, when performed (separate procedure) Diagnosis Code(s):        --- Professional ---                           K83.9, Disease of biliary tract, unspecified                           Z46.59, Encounter for fitting and adjustment of                            other gastrointestinal appliance and device                           K83.8, Other specified diseases of biliary tract CPT copyright 2022 American Medical Association. All rights reserved. The codes documented in this report are preliminary and upon coder review may   be revised to meet current compliance requirements. Ladene Artist, MD 06/26/2022 10:03:36 AM This report has been signed electronically. Number of Addenda: 0

## 2022-06-26 NOTE — H&P (Signed)
History & Physical  Primary Care Physician:  Ellyn Hack, MD Primary Gastroenterologist: Claudette Head, MD  CHIEF COMPLAINT:  CRC screening, Personal history of colon polyps   HPI: Martha Buck is a 61 y.o. female with a bile leak following subtotal cholecystectomy with biliary stent placed in August for ERCP with stent removal, possible stent replacement.     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   History of kidney stones    hx of   History of pneumonia    Joint pain     Past Surgical History:  Procedure Laterality Date   BACK SURGERY     BILIARY STENT PLACEMENT  04/09/2022   Procedure: BILIARY STENT PLACEMENT;  Surgeon: Meryl Dare, MD;  Location: [facility] ENDOSCOPY;  Service: Gastroenterology;;   CHOLECYSTECTOMY N/A 04/08/2022   Procedure: LAPAROSCOPIC CHOLECYSTECTOMY;  Surgeon: Violeta Gelinas, MD;  Location: [facility] OR;  Service: General;  Laterality: N/A;   ECTOPIC PREGNANCY SURGERY  1991   ERCP N/A 04/09/2022   Procedure: ENDOSCOPIC RETROGRADE CHOLANGIOPANCREATOGRAPHY (ERCP);  Surgeon: Meryl Dare, MD;  Location: [facility] ENDOSCOPY;  Service: Gastroenterology;  Laterality: N/A;   IR PERC CHOLECYSTOSTOMY  02/13/2022   IR RADIOLOGIST EVAL & MGMT  03/31/2022   KNEE ARTHROSCOPY WITH MEDIAL MENISECTOMY Right 11/26/2012   Procedure: KNEE ARTHROSCOPY WITH PARTIAL MEDIAL MENISECTOMY,DEBRIDEMENT;  Surgeon: Javier Docker, MD;  Location: [facility];  Service: Orthopedics;  Laterality: Right;   KNEE CLOSED REDUCTION Right 01/27/2017   Procedure: CLOSED MANIPULATION RIGHT KNEE;  Surgeon: Sheral Apley, MD;  Location: [facility];  Service: Orthopedics;  Laterality: Right;   LUMBAR FUSION  03-20-2010   L4 -- L5   SPHINCTEROTOMY  04/09/2022   Procedure: SPHINCTEROTOMY;  Surgeon: Meryl Dare, MD;  Location: [facility] ENDOSCOPY;  Service: Gastroenterology;;   TOTAL KNEE ARTHROPLASTY  Right 11/11/2016   Procedure: TOTAL KNEE ARTHROPLASTY;  Surgeon: Sheral Apley, MD;  Location: [facility] OR;  Service: Orthopedics;  Laterality: Right;    Prior to Admission medications   Medication Sig Start Date End Date Taking? Authorizing Provider  acetaminophen (TYLENOL) 500 MG tablet Take 2 tablets (1,000 mg total) by mouth every 6 (six) hours as needed for mild pain or fever. 02/17/22  Yes Juliet Rude, PA-C  DULoxetine (CYMBALTA) 60 MG capsule Take 60 mg by mouth at bedtime. 01/25/22  Yes [provider]  fluticasone (FLONASE) 50 MCG/ACT nasal spray Place 1-2 sprays into both nostrils daily. Patient taking differently: Place 1-2 sprays into both nostrils daily as needed for allergies. 06/03/20  Yes Wieters, Hallie C, PA-C  gabapentin (NEURONTIN) 400 MG capsule Take 2 capsules (800 mg total) by mouth 3 (three) times daily. Patient taking differently: Take 800 mg by mouth 2 (two) times daily. 02/17/22  Yes Trixie Deis R, PA-C  naloxone [facility]) nasal spray 4 mg/0.1 mL Place 1 spray into the nose once as needed (opiod overdose). 01/25/22  Yes [provider]  oxyCODONE (ROXICODONE) 15 MG immediate release tablet Take 15 mg by mouth 4 (four) times daily as needed for severe pain. 01/25/22  Yes [provider]  sodium chloride flush 0.9 % SOLN injection Use 5 mLs by Intracatheter route daily. Patient not taking: Reported on 04/30/2022 02/16/22   Han, Aimee H, PA-C    Current Facility-Administered Medications  Medication Dose Route Frequency Provider Last Rate Last Admin   0.9 %  sodium chloride infusion   Intravenous Continuous Meryl Dare, MD       ampicillin-sulbactam (UNASYN) 1.5 g in sodium chloride 0.9 % 100 mL IVPB  1.5 g Intravenous Once Meryl Dare, MD       lactated ringers infusion   Intravenous Continuous Meryl Dare, MD 10 mL/hr at 06/26/22 0820 New Bag at 06/26/22 0820    Allergies as of 04/11/2022 - Review Complete 04/09/2022  Allergen  Reaction Noted   Bee venom Anaphylaxis 10/08/2015   Aspirin Itching 11/27/2013   Lyrica [pregabalin] Rash 11/11/2016   Morphine and related Itching 11/24/2012    Family History  Problem Relation Age of Onset   Diabetes Mellitus II Mother    Hypertension Mother     Social History   Socioeconomic History   Marital status: Divorced    Spouse name: Not on file   Number of children: Not on file   Years of education: Not on file   Highest education level: Not on file  Occupational History   Not on file  Tobacco Use   Smoking status: Every Day    Packs/day: 0.25    Years: 34.00    Total pack years: 8.50    Types: Cigarettes   Smokeless tobacco: Never   Tobacco comments:    smokes 4 cigs per day  Vaping Use   Vaping Use: Never used  Substance and Sexual Activity   Alcohol use: Yes    Comment: socially   Drug use: No   Sexual activity: Not on file  Other Topics Concern   Not on file  Social History Narrative   Not on file   Social Determinants of Health   Financial Resource Strain: Not on file  Food Insecurity: No Food Insecurity (04/30/2022)   Hunger Vital Sign    Worried About Running Out of Food in the Last Year: Never true    Ran Out of Food in the Last Year: Never true  Transportation Needs: No Transportation Needs (04/30/2022)   PRAPARE - Administrator, Civil Service (Medical): No    Lack of Transportation (Non-Medical): No  Physical Activity: Not on file  Stress: Not on file  Social Connections: Not on file  Intimate Partner Violence: Not At Risk (04/30/2022)   Humiliation, Afraid, Rape, and Kick questionnaire    Fear of Current or Ex-Partner: No    Emotionally Abused: No    Physically Abused: No    Sexually Abused: No    Review of Systems:  All systems reviewed were negative except where noted in HPI.   Physical Exam: Vital signs in last 24 hours: Temp:  [97.9 F (36.6 C)] 97.9 F (36.6 C) (11/09 0811) Pulse Rate:  [68] 68 (11/09  0811) Resp:  [12] 12 (11/09 0811) BP: (151)/(82) 151/82 (11/09 0811) SpO2:  [100 %] 100 % (11/09 0811) Weight:  [77.1 kg] 77.1 kg (11/09 0811)   General:  Alert, well-developed, in NAD Head:  Normocephalic and atraumatic. Eyes:  Sclera clear, no icterus.   Conjunctiva pink. Ears:  Normal auditory acuity. Mouth:  No deformity or lesions.  Neck:  Supple; no masses . Lungs:  Clear throughout to auscultation.   No wheezes, crackles, or rhonchi. No acute distress. Heart:  Regular rate and rhythm; no murmurs. Abdomen:  Soft, nondistended, nontender. No masses, hepatomegaly. No obvious masses.  Normal bowel .    Rectal:  Deferred   Msk:  Symmetrical without gross deformities.. Pulses:  Normal pulses noted. Extremities:  Without edema. Neurologic:  Alert and  oriented x4;  grossly normal neurologically. Skin:  Intact without significant lesions or rashes. Cervical Nodes:  No significant cervical adenopathy. Psych:  Alert and cooperative. Normal mood and affect.   Impression / Plan:   Bile leak following subtotal cholecystectomy with biliary stent placed in August for ERCP with stent removal, possible stent replacement.  Venita Lick. Russella Dar  06/26/2022, 8:34 AM See Loretha Stapler,  GI, to contact our on call provider

## 2022-06-26 NOTE — Discharge Instructions (Signed)
YOU HAD AN ENDOSCOPIC PROCEDURE TODAY: Refer to the procedure report and other information in the discharge instructions given to you for any specific questions about what was found during the examination. If this information does not answer your questions, please call [place] office at [phone] to clarify.  ° °YOU SHOULD EXPECT: Some feelings of bloating in the abdomen. Passage of more gas than usual. Walking can help get rid of the air that was put into your GI tract during the procedure and reduce the bloating. If you had a lower endoscopy (such as a colonoscopy or flexible sigmoidoscopy) you may notice spotting of blood in your stool or on the toilet paper. Some abdominal soreness may be present for a day or two, also. ° °DIET: Your first meal following the procedure should be a light meal and then it is ok to progress to your normal diet. A half-sandwich or bowl of soup is an example of a good first meal. Heavy or fried foods are harder to digest and may make you feel nauseous or bloated. Drink plenty of fluids but you should avoid alcoholic beverages for 24 hours. If you had a esophageal dilation, please see attached instructions for diet.   ° °ACTIVITY: Your care partner should take you home directly after the procedure. You should plan to take it easy, moving slowly for the rest of the day. You can resume normal activity the day after the procedure however YOU SHOULD NOT DRIVE, use power tools, machinery or perform tasks that involve climbing or major physical exertion for 24 hours (because of the sedation medicines used during the test).  ° °SYMPTOMS TO REPORT IMMEDIATELY: °A gastroenterologist can be reached at any hour. Please call [phone]  for any of the following symptoms:  °Following lower endoscopy (colonoscopy, flexible sigmoidoscopy) °Excessive amounts of blood in the stool  °Significant tenderness, worsening of abdominal pains  °Swelling of the abdomen that is new, acute  °Fever of 100° or  higher  °Following upper endoscopy (EGD, EUS, ERCP, esophageal dilation) °Vomiting of blood or coffee ground material  °New, significant abdominal pain  °New, significant chest pain or pain under the shoulder blades  °Painful or persistently difficult swallowing  °New shortness of breath  °Black, tarry-looking or red, bloody stools ° °FOLLOW UP:  °If any biopsies were taken you will be contacted by phone or by letter within the next 1-3 weeks. Call [phone]  if you have not heard about the biopsies in 3 weeks.  °Please also call with any specific questions about appointments or follow up tests. ° °

## 2022-06-26 NOTE — Transfer of Care (Signed)
Immediate Anesthesia Transfer of Care Note  Patient: Martha Buck  Procedure(s) Performed: ENDOSCOPIC RETROGRADE CHOLANGIOPANCREATOGRAPHY (ERCP) WITH PROPOFOL GASTROINTESTINAL STENT REMOVAL  Patient Location: PACU and Endoscopy Unit  Anesthesia Type:General  Level of Consciousness: awake, alert , and patient cooperative  Airway & Oxygen Therapy: Patient Spontanous Breathing and Patient connected to face mask oxygen  Post-op Assessment: Report given to RN and Post -op Vital signs reviewed and stable  Post vital signs: Reviewed and stable  Last Vitals:  Vitals Value Taken Time  BP 138/81   Temp    Pulse 88   Resp 16   SpO2 98     Last Pain:  Vitals:   06/26/22 0811  TempSrc: Oral  PainSc: 0-No pain         Complications: No notable events documented.

## 2022-06-26 NOTE — Anesthesia Procedure Notes (Signed)
Procedure Name: Intubation Date/Time: 06/26/2022 9:10 AM  Performed by: Eben Burow, CRNAPre-anesthesia Checklist: Patient identified, Emergency Drugs available, Suction available, Patient being monitored and Timeout performed Patient Re-evaluated:Patient Re-evaluated prior to induction Oxygen Delivery Method: Circle system utilized Preoxygenation: Pre-oxygenation with 100% oxygen Induction Type: IV induction Ventilation: Mask ventilation without difficulty Laryngoscope Size: Mac and 4 Grade View: Grade I Tube type: Oral Tube size: 7.0 mm Number of attempts: 1 Airway Equipment and Method: Stylet Placement Confirmation: ETT inserted through vocal cords under direct vision, positive ETCO2 and breath sounds checked- equal and bilateral Secured at: 22 cm Tube secured with: Tape Dental Injury: Teeth and Oropharynx as per pre-operative assessment

## 2022-06-26 NOTE — Anesthesia Preprocedure Evaluation (Addendum)
Anesthesia Evaluation  Patient identified by MRN, date of birth, ID band Patient awake    Reviewed: Allergy & Precautions, NPO status , Patient's Chart, lab work & pertinent test results  Airway Mallampati: II  TM Distance: >3 FB Neck ROM: Full    Dental  (+) Teeth Intact, Dental Advisory Given   Pulmonary Current Smoker   breath sounds clear to auscultation       Cardiovascular negative cardio ROS  Rhythm:Regular Rate:Normal     Neuro/Psych  PSYCHIATRIC DISORDERS  Depression       GI/Hepatic negative GI ROS, Neg liver ROS,,,  Endo/Other  negative endocrine ROS    Renal/GU negative Renal ROS     Musculoskeletal  (+) Arthritis ,    Abdominal Normal abdominal exam  (+)   Peds  Hematology negative hematology ROS (+)   Anesthesia Other Findings   Reproductive/Obstetrics                             Anesthesia Physical Anesthesia Plan  ASA: 2  Anesthesia Plan: General   Post-op Pain Management:    Induction: Intravenous  PONV Risk Score and Plan: 2 and Ondansetron and Midazolam  Airway Management Planned: Oral ETT  Additional Equipment: None  Intra-op Plan:   Post-operative Plan: Extubation in OR  Informed Consent: I have reviewed the patients History and Physical, chart, labs and discussed the procedure including the risks, benefits and alternatives for the proposed anesthesia with the patient or authorized representative who has indicated his/her understanding and acceptance.     Dental advisory given  Plan Discussed with: CRNA  Anesthesia Plan Comments:        Anesthesia Quick Evaluation

## 2022-06-26 NOTE — Anesthesia Postprocedure Evaluation (Signed)
Anesthesia Post Note  Patient: Martha Buck  Procedure(s) Performed: ENDOSCOPIC RETROGRADE CHOLANGIOPANCREATOGRAPHY (ERCP) WITH PROPOFOL GASTROINTESTINAL STENT REMOVAL REMOVAL OF STONES     Patient location during evaluation: PACU Anesthesia Type: General Level of consciousness: awake and alert Pain management: pain level controlled Vital Signs Assessment: post-procedure vital signs reviewed and stable Respiratory status: spontaneous breathing, nonlabored ventilation, respiratory function stable and patient connected to nasal cannula oxygen Cardiovascular status: blood pressure returned to baseline and stable Postop Assessment: no apparent nausea or vomiting Anesthetic complications: no   No notable events documented.  Last Vitals:  Vitals:   06/26/22 1030 06/26/22 1040  BP: 117/71 130/83  Pulse: 70 73  Resp: 13 20  Temp:    SpO2: 96% 97%    Last Pain:  Vitals:   06/26/22 1020  TempSrc:   PainSc: 0-No pain                 Shelton Silvas

## 2022-06-29 ENCOUNTER — Encounter (HOSPITAL_COMMUNITY): Payer: Self-pay | Admitting: Gastroenterology

## 2022-07-01 ENCOUNTER — Other Ambulatory Visit: Payer: Self-pay | Admitting: General Practice

## 2022-07-01 DIAGNOSIS — Z1231 Encounter for screening mammogram for malignant neoplasm of breast: Secondary | ICD-10-CM

## 2022-07-03 ENCOUNTER — Other Ambulatory Visit: Payer: Self-pay | Admitting: Family Medicine

## 2022-07-03 DIAGNOSIS — Z1231 Encounter for screening mammogram for malignant neoplasm of breast: Secondary | ICD-10-CM

## 2022-08-27 ENCOUNTER — Ambulatory Visit: Payer: Medicare Other

## 2022-08-28 ENCOUNTER — Ambulatory Visit: Payer: Medicare Other

## 2023-05-28 ENCOUNTER — Other Ambulatory Visit: Payer: Self-pay

## 2023-05-28 ENCOUNTER — Encounter (HOSPITAL_COMMUNITY): Payer: Self-pay

## 2023-05-28 ENCOUNTER — Emergency Department (HOSPITAL_COMMUNITY): Payer: 59

## 2023-05-28 ENCOUNTER — Emergency Department (HOSPITAL_COMMUNITY)
Admission: EM | Admit: 2023-05-28 | Discharge: 2023-05-28 | Disposition: A | Discharge status: Home / Self Care | Payer: 59 | Attending: Emergency Medicine | Admitting: Emergency Medicine

## 2023-05-28 DIAGNOSIS — R000 Tachycardia, unspecified: Secondary | ICD-10-CM | POA: Diagnosis not present

## 2023-05-28 DIAGNOSIS — Z1152 Encounter for screening for COVID-19: Secondary | ICD-10-CM | POA: Diagnosis not present

## 2023-05-28 DIAGNOSIS — R509 Fever, unspecified: Secondary | ICD-10-CM | POA: Diagnosis not present

## 2023-05-28 DIAGNOSIS — R531 Weakness: Secondary | ICD-10-CM | POA: Insufficient documentation

## 2023-05-28 DIAGNOSIS — M791 Myalgia, unspecified site: Secondary | ICD-10-CM | POA: Diagnosis not present

## 2023-05-28 DIAGNOSIS — Z79899 Other long term (current) drug therapy: Secondary | ICD-10-CM | POA: Diagnosis not present

## 2023-05-28 DIAGNOSIS — I10 Essential (primary) hypertension: Secondary | ICD-10-CM | POA: Diagnosis not present

## 2023-05-28 DIAGNOSIS — D72829 Elevated white blood cell count, unspecified: Secondary | ICD-10-CM | POA: Insufficient documentation

## 2023-05-28 LAB — URINALYSIS, ROUTINE W REFLEX MICROSCOPIC
Bacteria, UA: NONE SEEN
Bilirubin Urine: NEGATIVE
Glucose, UA: NEGATIVE mg/dL
Hgb urine dipstick: NEGATIVE
Ketones, ur: NEGATIVE mg/dL
Nitrite: NEGATIVE
Protein, ur: NEGATIVE mg/dL
Specific Gravity, Urine: 1.02 (ref 1.005–1.030)
pH: 6 (ref 5.0–8.0)

## 2023-05-28 LAB — BASIC METABOLIC PANEL
Anion gap: 17 — ABNORMAL HIGH (ref 5–15)
BUN: 16 mg/dL (ref 8–23)
CO2: 26 mmol/L (ref 22–32)
Calcium: 9 mg/dL (ref 8.9–10.3)
Chloride: 100 mmol/L (ref 98–111)
Creatinine, Ser: 0.74 mg/dL (ref 0.44–1.00)
GFR, Estimated: >60 mL/min (ref >60)
Glucose, Bld: 95 mg/dL (ref 70–99)
Potassium: 3.7 mmol/L (ref 3.5–5.1)
Sodium: 143 mmol/L (ref 135–145)

## 2023-05-28 LAB — CBC
HCT: 38.3 % (ref 36.0–46.0)
Hemoglobin: 12.4 g/dL (ref 12.0–15.0)
MCH: 31.4 pg (ref 26.0–34.0)
MCHC: 32.4 g/dL (ref 30.0–36.0)
MCV: 97 fL (ref 80.0–100.0)
Platelets: 264 10*3/uL (ref 150–400)
RBC: 3.95 MIL/uL (ref 3.87–5.11)
RDW: 14.8 % (ref 11.5–15.5)
WBC: 10.9 10*3/uL — ABNORMAL HIGH (ref 4.0–10.5)
nRBC: 0 % (ref 0.0–0.2)

## 2023-05-28 LAB — RESP PANEL BY RT-PCR (RSV, FLU A&B, COVID)  RVPGX2
Influenza A by PCR: NEGATIVE
Influenza B by PCR: NEGATIVE
Resp Syncytial Virus by PCR: NEGATIVE
SARS Coronavirus 2 by RT PCR: NEGATIVE

## 2023-05-28 LAB — SARS CORONAVIRUS 2 BY RT PCR: SARS Coronavirus 2 by RT PCR: NEGATIVE

## 2023-05-28 LAB — CBG MONITORING, ED: Glucose-Capillary: 95 mg/dL (ref 70–99)

## 2023-05-28 MED ORDER — IBUPROFEN 800 MG PO TABS
800.0000 mg | ORAL_TABLET | Freq: Once | ORAL | Status: AC
  Administered 2023-05-28: 800 mg via ORAL
  Filled 2023-05-28: qty 1

## 2023-05-28 MED ORDER — BENZONATATE 100 MG PO CAPS
200.0000 mg | ORAL_CAPSULE | Freq: Once | ORAL | Status: AC
  Administered 2023-05-28: 200 mg via ORAL
  Filled 2023-05-28: qty 2

## 2023-05-28 NOTE — ED Provider Notes (Signed)
[place] EMERGENCY DEPARTMENT AT [facility] Provider Note   CSN: [id number] Arrival date & time: 05/28/23  1555     History  Chief Complaint  Patient presents with   Weakness   bodyaches    Martha Buck is a 62 y.o. female.   Weakness 62 year old female with past medical history of depression, hypertension presenting for evaluation of weakness and bodyaches.  Patient said that she was in her normal state of health when she went to sleep last evening.  However, around 7 AM this morning patient awoke with fever, chills, body aches, and headaches.  She states this is new for her.  She has no known sick contacts.  She lives alone with her dog.  Patient denies any recent changes to her medications.  No vision changes, confusion, abdominal pain, pain with urination, urinary frequency.     Home Medications Prior to Admission medications   Medication Sig Start Date End Date Taking? Authorizing Provider  acetaminophen (TYLENOL) 500 MG tablet Take 2 tablets (1,000 mg total) by mouth every 6 (six) hours as needed for mild pain or fever. 02/17/22   Juliet Rude, PA-C  DULoxetine (CYMBALTA) 60 MG capsule Take 60 mg by mouth at bedtime. 01/25/22   [provider]  fluticasone (FLONASE) 50 MCG/ACT nasal spray Place 1-2 sprays into both nostrils daily. Patient taking differently: Place 1-2 sprays into both nostrils daily as needed for allergies. 06/03/20   Wieters, Hallie C, PA-C  gabapentin (NEURONTIN) 400 MG capsule Take 2 capsules (800 mg total) by mouth 3 (three) times daily. Patient taking differently: Take 800 mg by mouth 2 (two) times daily. 02/17/22   Juliet Rude, PA-C  naloxone [facility]) nasal spray 4 mg/0.1 mL Place 1 spray into the nose once as needed (opiod overdose). 01/25/22   [provider]  oxyCODONE (ROXICODONE) 15 MG immediate release tablet Take 15 mg by mouth 4 (four) times daily as needed for severe pain. 01/25/22   [provider]  sodium chloride flush 0.9 % SOLN injection Use 5 mLs by Intracatheter route daily. Patient not taking: Reported on 04/30/2022 02/16/22   Han, Aimee H, PA-C      Allergies    Bee venom, Aspirin, Lyrica [pregabalin], and Morphine and codeine    Review of Systems   Review of Systems  Neurological:  Positive for weakness.    Physical Exam Updated Vital Signs BP 117/76 (BP Location: Left Arm)   Pulse 74   Temp 98.9 F (37.2 C) (Oral)   Resp 19   Ht 5\' 9"  (1.753 m)   Wt 77.1 kg   LMP 04/26/2012   SpO2 94%   BMI 25.10 kg/m  Physical Exam Constitutional:      General: She is not in acute distress.    Appearance: She is not ill-appearing.  HENT:     Head: Normocephalic.     Right Ear: External ear normal.     Left Ear: External ear normal.     Nose: Nose normal.     Mouth/Throat:     Mouth: Mucous membranes are moist.     Pharynx: Oropharynx is clear. No oropharyngeal exudate.  Eyes:     Extraocular Movements: Extraocular movements intact.     Conjunctiva/sclera: Conjunctivae normal.     Pupils: Pupils are equal, round, and reactive to light.  Cardiovascular:     Rate and Rhythm: Regular rhythm. Tachycardia present.     Pulses: Normal pulses.  Pulmonary:  Effort: Pulmonary effort is normal. No respiratory distress.     Breath sounds: No wheezing.  Abdominal:     General: Abdomen is flat.     Palpations: Abdomen is soft.     Tenderness: There is no abdominal tenderness. There is no guarding or rebound.  Musculoskeletal:        General: Normal range of motion.     Cervical back: Normal range of motion and neck supple. No rigidity or tenderness.     Right lower leg: No edema.     Left lower leg: No edema.  Skin:    General: Skin is warm and dry.     Findings: No rash.  Neurological:     General: No focal deficit present.     Mental Status: She is alert.     Cranial Nerves: No cranial nerve deficit.     Motor: No weakness.     ED Results /  Procedures / Treatments   Labs (all labs ordered are listed, but only abnormal results are displayed) Labs Reviewed  BASIC METABOLIC PANEL - Abnormal; Notable for the following components:      Result Value   Anion gap 17 (*)    All other components within normal limits  CBC - Abnormal; Notable for the following components:   WBC 10.9 (*)    All other components within normal limits  URINALYSIS, ROUTINE W REFLEX MICROSCOPIC - Abnormal; Notable for the following components:   Leukocytes,Ua MODERATE (*)    All other components within normal limits  SARS CORONAVIRUS 2 BY RT PCR  RESP PANEL BY RT-PCR (RSV, FLU A&B, COVID)  RVPGX2  CBG MONITORING, ED    EKG EKG Interpretation Date/Time:  Thursday May 28 2023 16:18:13 EDT Ventricular Rate:  87 PR Interval:  162 QRS Duration:  88 QT Interval:  346 QTC Calculation: 416 R Axis:   7  Text Interpretation: Normal sinus rhythm Possible Left atrial enlargement Minimal voltage criteria for LVH, may be normal variant ( R in aVL ) Abnormal ECG When compared with ECG of 29-Apr-2022 18:07, PREVIOUS ECG IS PRESENT  No significant change since last tracing  Confirmed by Jacalyn Lefevre [phone]) on 05/28/2023 6:12:28 PM  Radiology DG Chest Portable 1 View  Result Date: 05/28/2023 CLINICAL DATA:  Fever EXAM: PORTABLE CHEST 1 VIEW COMPARISON:  04/29/2022 x-ray and CT FINDINGS: No consolidation, pneumothorax or effusion. No edema. Tortuous ectatic aorta. Stable cardiopericardial silhouette when adjusting for technique. Degenerative changes of the spine. Artifact from patient's clothing. IMPRESSION: No acute cardiopulmonary disease. Electronically Signed   By: Karen Kays M.D.   On: 05/28/2023 20:52    Procedures Procedures    Medications Ordered in ED Medications  ibuprofen (ADVIL) tablet 800 mg (800 mg Oral Given 05/28/23 1638)  benzonatate (TESSALON) capsule 200 mg (200 mg Oral Given 05/28/23 2054)    ED Course/ Medical Decision  Making/ A&P                                Medical Decision Making Amount and/or Complexity of Data Reviewed Labs: ordered. Radiology: ordered.  Risk Prescription drug management.   Martha Buck is a 62 y.o. female who presented to the Emergency Department for generalized weakness accompanied by fever, chills, body aches x 1 day.    Endorses fevers and chills. Denies recent illness. Denies nausea, vomiting. Endorses myalgias. Denies facial droop or slurred speech. Denies focal neurologic deficits. Denies  drug use, new/changed meds, recent trauma, or bleeding.    No history of anemia. Denies syncope or near syncope. Denies chest pain, palpitations, or shortness of breath.   On exam: Febrile on arrival (101.8 F), VS stable. Neurologic exam: CN I-XII: grossly intact, Sensation: normal in upper and lower extremities, Strength 5/5 in both upper and lower extremities, Coordination intact.  Differential Diagnosis:  I do not think this is a CVA or other intracranial pathology, as the patient has no focal neuro deficits, no slurred speech or dysphagia, no facial droop. No history of recent head trauma to suggest intracranial bleed.  No confusion, altered mental status, or other meningeal signs suggestive of meningitis or encephalitis.  The patient denies any drug use or new/changed medications, doubt toxicologic cause. The patient denies any chest pain or shortness of breath, ACS unlikely. Patient without history of endocrine disorders such as DM or thyroid disease, however electrolyte/metabolic derangements remain on the differential and further workup will be performed. No history of anemia or known bleeding, however will obtain CBC to evaluate for possible anemia. Patient without syncope, chest pain, or palpitations, however will obtain ECG to assess for possible arrhythmia.  Other: cauda equina syndrome, peripheral neuropathy, infection, dysrhythmia, dehydration, autoimmune disease.    Lab/Imaging Results:  Patient has a mild leukocytosis of 10.9.  Metabolic panel without gross electrolyte derangements.  No AKI.  She has tested negative for both COVID and flu.  Urinalysis has moderate leukocytes, but no other signs consistent with infection.  Would not treat.  Chest x-ray without focal airspace disease.  No consolidation suggestive of pneumonia.  ECG: I interpreted the ECG. It reveals a sinus rhythm, 87 bpm. The QTc, PR, and QRS are appropriate. There are no signs of acute ischemia or of significant electrical abnormalities. There are no ST depressions or elevations. There is no evidence of a high-grade conduction block.  Reassessment:  Fevers have resolved following ibuprofen. I believe that the patient is safe for discharge home with outpatient follow-up.  We discussed the diagnosis and risks, and agree with plan to discharge home and follow up with PCP within 48 hours.   Patient was also advised to return to the Emergency Department if new or worsening symptoms occur. I provided thorough ED return precautions, including changing or worsening pain, weakness, vomiting, fever, or abnormal sensation or mentation.  Patient understands and agrees with the plan and feels safe to go home.   The plan for this patient was discussed with my attending physician, who voiced agreement and who oversaw evaluation and treatment of this patient.    Note: Chief Executive Officer was used in the creation of this note.   Final Clinical Impression(s) / ED Diagnoses Final diagnoses:  Weakness  Fever in adult    Rx / DC Orders ED Discharge Orders     None         Lyman Speller, MD 05/28/23 2101    Jacalyn Lefevre, MD 05/28/23 2152

## 2023-05-28 NOTE — Discharge Instructions (Signed)
Martha Buck:  Thank you for allowing [place] to take care of you today.  We hope you begin feeling better soon. You were seen today for weakness, fevers, chills.  You tested negative for COVID and flu  To-Do: Please follow-up with your primary doctor to schedule an appointment with a new primary care doctor within the next 2-3 days.  Please return to the Emergency Department or call 911 if you experience chest pain, shortness of breath, severe pain, severe fever, altered mental status, or have any reason to think that you need emergency medical care.  Thank you again.  Hope you feel better soon.

## 2023-05-28 NOTE — ED Triage Notes (Addendum)
PT c/o bodyaches and generalized weakness started this morning. Pt states took tylenol at 1300 today

## 2023-05-28 NOTE — ED Notes (Signed)
Refusing to remain on cardiac monitoring and continuous pulse ox monitoring. Pt reports she is ready to go home. Provider made aware.

## 2023-05-28 NOTE — ED Notes (Signed)
Awaiting patient from lobby 

## 2023-05-28 NOTE — ED Notes (Signed)
Report received from [employer] and Scientist, forensic. Assumed care of pt at this time.

## 2023-05-28 NOTE — ED Notes (Signed)
ED Provider at bedside. 

## 2023-07-20 ENCOUNTER — Encounter (HOSPITAL_COMMUNITY): Payer: Self-pay | Admitting: Emergency Medicine

## 2023-07-20 ENCOUNTER — Emergency Department (HOSPITAL_COMMUNITY): Payer: 59

## 2023-07-20 ENCOUNTER — Other Ambulatory Visit: Payer: Self-pay

## 2023-07-20 ENCOUNTER — Emergency Department (HOSPITAL_COMMUNITY)
Admission: EM | Admit: 2023-07-20 | Discharge: 2023-07-20 | Disposition: A | Discharge status: Home / Self Care | Payer: 59 | Attending: Emergency Medicine | Admitting: Emergency Medicine

## 2023-07-20 DIAGNOSIS — R531 Weakness: Secondary | ICD-10-CM | POA: Insufficient documentation

## 2023-07-20 DIAGNOSIS — R252 Cramp and spasm: Secondary | ICD-10-CM | POA: Diagnosis present

## 2023-07-20 LAB — CBC
HCT: 39.7 % (ref 36.0–46.0)
Hemoglobin: 13 g/dL (ref 12.0–15.0)
MCH: 31.8 pg (ref 26.0–34.0)
MCHC: 32.7 g/dL (ref 30.0–36.0)
MCV: 97.1 fL (ref 80.0–100.0)
Platelets: 241 10*3/uL (ref 150–400)
RBC: 4.09 MIL/uL (ref 3.87–5.11)
RDW: 14.2 % (ref 11.5–15.5)
WBC: 9.8 10*3/uL (ref 4.0–10.5)
nRBC: 0 % (ref 0.0–0.2)

## 2023-07-20 LAB — BASIC METABOLIC PANEL
Anion gap: 6 (ref 5–15)
BUN: 26 mg/dL — ABNORMAL HIGH (ref 8–23)
CO2: 25 mmol/L (ref 22–32)
Calcium: 8.6 mg/dL — ABNORMAL LOW (ref 8.9–10.3)
Chloride: 109 mmol/L (ref 98–111)
Creatinine, Ser: 0.98 mg/dL (ref 0.44–1.00)
GFR, Estimated: >60 mL/min (ref >60)
Glucose, Bld: 77 mg/dL (ref 70–99)
Potassium: 4.1 mmol/L (ref 3.5–5.1)
Sodium: 140 mmol/L (ref 135–145)

## 2023-07-20 NOTE — Discharge Instructions (Signed)
You are seen in the emergency department after your episode of muscle cramps and weakness.  Your workup showed no signs of stroke or abnormal electrolytes or dehydration.  It is unclear what is causing your symptoms today however you should follow-up with your primary doctor to have your symptoms rechecked and make sure that you are drinking plenty of fluids and staying well-hydrated.  You should return to the emergency department if your symptoms recur and do not go away on its own, you have numbness or weakness on one side of your body compared to the other or if you have any other new or concerning symptoms.

## 2023-07-20 NOTE — ED Provider Triage Note (Signed)
Emergency Medicine Provider Triage Evaluation Note  Martha Buck , a 62 y.o. female  was evaluated in triage.  Pt complains of weakness and cramping.  Patient states that she was at the [facility] and went to her car to get something, and had sudden onset of weakness in her extremities.  This was going on for probably close to 20 minutes, and started to resolve when she was in the ambulance.  She states this was somewhat painful too.  Now she is just having cramping pain in her bilateral upper arms, sometimes radiating down into her hands.  She never had any change in mental status, slurred speech or difficulty swallowing.  Has baseline right leg weakness after prior surgery.  Review of Systems  Positive: Weakness, myalgias Negative:   Physical Exam  BP 99/69   Pulse 74   Temp 98.2 F (36.8 C)   Resp 18   LMP 04/26/2012   SpO2 97%  Gen:   Awake, no distress   Resp:  Normal effort  MSK:   Moves extremities without difficulty  Other:  Normal sensation in extremities, muscular TTP in bilateral upper arms, no facial droop, no slurred speech, residual right leg weakness, otherwise normal strength  Medical Decision Making  Medically screening exam initiated at 3:25 PM.  Appropriate orders placed.  Sudiksha Rippy Fiscus was informed that the remainder of the evaluation will be completed by another provider, this initial triage assessment does not replace that evaluation, and the importance of remaining in the ED until their evaluation is complete.  Workup initiated   Su Monks, PA-C 07/20/23 1530

## 2023-07-20 NOTE — ED Provider Notes (Signed)
[place] EMERGENCY DEPARTMENT AT [facility] Provider Note   CSN: [id number] Arrival date & time: 07/20/23  1453     History  Chief Complaint  Patient presents with   Spasms    Martha Buck is a 62 y.o. female.  Is a 62 year old female with no significant past medical history presenting to the emergency department with muscle cramps and spasm.  The patient reports that she was waiting in the car at the [facility] when all of a sudden both arms and legs spasmed and she could not move her arms or legs and reports that she could not feel anything.  She states that she was slowly able to get up and go into the laundromat to ask her sister to call 911.  She states that when EMS picked her up and brought her in the ambulance and put on the blood pressure cuff her arm cramped up again.  She states since then her symptoms have slowly started to improve and she now feels back to her baseline.  She states that she has been eating and drinking normally and no recent diarrhea or medication changes.  The history is provided by the patient and a relative.       Home Medications Prior to Admission medications   Medication Sig Start Date End Date Taking? Authorizing Provider  acetaminophen (TYLENOL) 500 MG tablet Take 2 tablets (1,000 mg total) by mouth every 6 (six) hours as needed for mild pain or fever. 02/17/22   Juliet Rude, PA-C  DULoxetine (CYMBALTA) 60 MG capsule Take 60 mg by mouth at bedtime. 01/25/22   [provider]  fluticasone (FLONASE) 50 MCG/ACT nasal spray Place 1-2 sprays into both nostrils daily. Patient taking differently: Place 1-2 sprays into both nostrils daily as needed for allergies. 06/03/20   Wieters, Hallie C, PA-C  gabapentin (NEURONTIN) 400 MG capsule Take 2 capsules (800 mg total) by mouth 3 (three) times daily. Patient taking differently: Take 800 mg by mouth 2 (two) times daily. 02/17/22   Juliet Rude, PA-C  naloxone [facility]) nasal  spray 4 mg/0.1 mL Place 1 spray into the nose once as needed (opiod overdose). 01/25/22   [provider]  oxyCODONE (ROXICODONE) 15 MG immediate release tablet Take 15 mg by mouth 4 (four) times daily as needed for severe pain. 01/25/22   [provider]  sodium chloride flush 0.9 % SOLN injection Use 5 mLs by Intracatheter route daily. Patient not taking: Reported on 04/30/2022 02/16/22   Han, Aimee H, PA-C      Allergies    Bee venom, Aspirin, Lyrica [pregabalin], and Morphine and codeine    Review of Systems   Review of Systems  Physical Exam Updated Vital Signs BP 103/84 (BP Location: Right Arm)   Pulse 80   Temp 98.2 F (36.8 C)   Resp 16   LMP 04/26/2012   SpO2 98%  Physical Exam Vitals and nursing note reviewed.  Constitutional:      General: She is not in acute distress.    Appearance: Normal appearance.  HENT:     Head: Normocephalic and atraumatic.     Nose: Nose normal.     Mouth/Throat:     Mouth: Mucous membranes are moist.     Pharynx: Oropharynx is clear.  Eyes:     Extraocular Movements: Extraocular movements intact.     Conjunctiva/sclera: Conjunctivae normal.     Pupils: Pupils are equal, round, and reactive to light.  Cardiovascular:  Rate and Rhythm: Normal rate and regular rhythm.     Heart sounds: Normal heart sounds.  Pulmonary:     Effort: Pulmonary effort is normal.     Breath sounds: Normal breath sounds.  Abdominal:     General: Abdomen is flat.     Palpations: Abdomen is soft.     Tenderness: There is no abdominal tenderness.  Musculoskeletal:        General: Normal range of motion.     Cervical back: Normal range of motion.  Skin:    General: Skin is warm and dry.  Neurological:     General: No focal deficit present.     Mental Status: She is alert and oriented to person, place, and time.     Cranial Nerves: No cranial nerve deficit.     Sensory: No sensory deficit.     Motor: No weakness.     Coordination:  Coordination normal.     Comments: Unable to hold up right leg due to chronic right knee issue otherwise no drift in the other 3 extremities  Psychiatric:        Mood and Affect: Mood normal.     ED Results / Procedures / Treatments   Labs (all labs ordered are listed, but only abnormal results are displayed) Labs Reviewed  BASIC METABOLIC PANEL - Abnormal; Notable for the following components:      Result Value   BUN 26 (*)    Calcium 8.6 (*)    All other components within normal limits  CBC    EKG None  Radiology CT Head Wo Contrast  Result Date: 07/20/2023 CLINICAL DATA:  Muscle cramps.  TIA. EXAM: CT HEAD WITHOUT CONTRAST TECHNIQUE: Contiguous axial images were obtained from the base of the skull through the vertex without intravenous contrast. RADIATION DOSE REDUCTION: This exam was performed according to the departmental dose-optimization program which includes automated exposure control, adjustment of the mA and/or kV according to patient size and/or use of iterative reconstruction technique. COMPARISON:  None Available. FINDINGS: Brain: No evidence of acute infarction, hemorrhage, hydrocephalus, extra-axial collection or mass lesion/mass effect. Vascular: No hyperdense vessel or unexpected calcification. Skull: Normal. Negative for fracture or focal lesion. Sinuses/Orbits: No acute finding. IMPRESSION: Normal head CT. Electronically Signed   By: Tiburcio Pea M.D.   On: 07/20/2023 19:07    Procedures Procedures    Medications Ordered in ED Medications - No data to display  ED Course/ Medical Decision Making/ A&P Clinical Course as of 07/20/23 1925  Mon Jul 20, 2023  1920 No acute abnormality on [facility], labs within normal range. Unclear etiology of symptoms but with patient back to baseline she is stable for discharge home with outpatient follow up. [VK]    Clinical Course User Index [VK] Rexford Maus, DO                                 Medical Decision  Making This patient presents to the ED with chief complaint(s) of weakness/cramps with no pertinent past medical history which further complicates the presenting complaint. The complaint involves an extensive differential diagnosis and also carries with it a high risk of complications and morbidity.    The differential diagnosis includes muscle cramps or spasm, electrolyte abnormality, dehydration, considering CVA though less likely with bilateral involvement and no current focal deficits on exam  Additional history obtained: Additional history obtained from family Records reviewed N/A  ED Course and Reassessment: On patient's arrival she is hemodynamically stable in no acute distress.  Was initially evaluated by provider in triage and had labs and CT ordered.  Both are pending at this time.  Patient has no focal neurologic deficits at this time on exam.  She will continue to be closely reassessed.  Independent labs interpretation:  The following labs were independently interpreted: within normal range  Independent visualization of imaging: - I independently visualized the following imaging with scope of interpretation limited to determining acute life threatening conditions related to emergency care: CTH, which revealed no acute disease  Consultation: - Consulted or discussed management/test interpretation w/ external professional: N/A  Consideration for admission or further workup: Patient has no emergent conditions requiring admission or further work-up at this time and is stable for discharge home with primary care follow-up  Social Determinants of health: N/A            Final Clinical Impression(s) / ED Diagnoses Final diagnoses:  Muscle cramps  Episode of generalized weakness    Rx / DC Orders ED Discharge Orders     None         Rexford Maus, DO 07/20/23 1925

## 2023-07-20 NOTE — ED Triage Notes (Signed)
Pt BIB [facility] from [facility] for cramping in bilateral arms. Pt thought she was having a stroke. Per EMS states that pt endorses cramps in legs and arms at night.  No known issues with kidneys or electrolyte imbalance. Per EMS symptoms eased up en route.    156/90 97% HR 86 CBG 104

## 2023-11-08 ENCOUNTER — Other Ambulatory Visit: Payer: Self-pay

## 2023-11-08 ENCOUNTER — Emergency Department (HOSPITAL_COMMUNITY)

## 2023-11-08 ENCOUNTER — Emergency Department (HOSPITAL_COMMUNITY)
Admission: EM | Admit: 2023-11-08 | Discharge: 2023-11-09 | Disposition: A | Discharge status: Home / Self Care | Attending: Student | Admitting: Student

## 2023-11-08 ENCOUNTER — Encounter (HOSPITAL_COMMUNITY): Payer: Self-pay | Admitting: Emergency Medicine

## 2023-11-08 DIAGNOSIS — R252 Cramp and spasm: Secondary | ICD-10-CM

## 2023-11-08 DIAGNOSIS — E876 Hypokalemia: Secondary | ICD-10-CM | POA: Insufficient documentation

## 2023-11-08 DIAGNOSIS — R140 Abdominal distension (gaseous): Secondary | ICD-10-CM | POA: Diagnosis present

## 2023-11-08 LAB — BASIC METABOLIC PANEL
Anion gap: 10 (ref 5–15)
BUN: 20 mg/dL (ref 8–23)
CO2: 18 mmol/L — ABNORMAL LOW (ref 22–32)
Calcium: 9.1 mg/dL (ref 8.9–10.3)
Chloride: 110 mmol/L (ref 98–111)
Creatinine, Ser: 1.01 mg/dL — ABNORMAL HIGH (ref 0.44–1.00)
GFR, Estimated: >60 mL/min (ref >60)
Glucose, Bld: 133 mg/dL — ABNORMAL HIGH (ref 70–99)
Potassium: 2.9 mmol/L — ABNORMAL LOW (ref 3.5–5.1)
Sodium: 138 mmol/L (ref 135–145)

## 2023-11-08 LAB — CBC
HCT: 39.2 % (ref 36.0–46.0)
Hemoglobin: 12.4 g/dL (ref 12.0–15.0)
MCH: 30.9 pg (ref 26.0–34.0)
MCHC: 31.6 g/dL (ref 30.0–36.0)
MCV: 97.8 fL (ref 80.0–100.0)
Platelets: 334 10*3/uL (ref 150–400)
RBC: 4.01 MIL/uL (ref 3.87–5.11)
RDW: 13.3 % (ref 11.5–15.5)
WBC: 11.3 10*3/uL — ABNORMAL HIGH (ref 4.0–10.5)
nRBC: 0 % (ref 0.0–0.2)

## 2023-11-08 LAB — HEPATIC FUNCTION PANEL
ALT: 25 U/L (ref 0–44)
AST: 44 U/L — ABNORMAL HIGH (ref 15–41)
Albumin: 3.5 g/dL (ref 3.5–5.0)
Alkaline Phosphatase: 68 U/L (ref 38–126)
Bilirubin, Direct: <0.1 mg/dL (ref 0.0–0.2)
Total Bilirubin: 0.5 mg/dL (ref 0.0–1.2)
Total Protein: 7.2 g/dL (ref 6.5–8.1)

## 2023-11-08 LAB — CK: Total CK: 115 U/L (ref 38–234)

## 2023-11-08 LAB — TROPONIN I (HIGH SENSITIVITY)
Troponin I (High Sensitivity): 4 ng/L (ref <18)
Troponin I (High Sensitivity): 4 ng/L (ref <18)

## 2023-11-08 LAB — LIPASE, BLOOD: Lipase: 21 U/L (ref 11–51)

## 2023-11-08 LAB — MAGNESIUM: Magnesium: 2.3 mg/dL (ref 1.7–2.4)

## 2023-11-08 MED ORDER — IOHEXOL 350 MG/ML SOLN
75.0000 mL | Freq: Once | INTRAVENOUS | Status: AC | PRN
  Administered 2023-11-08: 75 mL via INTRAVENOUS

## 2023-11-08 MED ORDER — POTASSIUM CHLORIDE 10 MEQ/100ML IV SOLN
10.0000 meq | Freq: Once | INTRAVENOUS | Status: AC
  Administered 2023-11-08: 10 meq via INTRAVENOUS
  Filled 2023-11-08: qty 100

## 2023-11-08 NOTE — ED Notes (Signed)
 Patient now endorsing chest cramping/ cramping under the rib cage area.

## 2023-11-08 NOTE — ED Provider Triage Note (Signed)
 Emergency Medicine Provider Triage Evaluation Note  Martha Buck , a 63 y.o. female  was evaluated in triage.  Pt complains of fully body cramping. Happened when she sat down on her bed to watch TV. Felt her hands "ball up" and she couldn't relax them. Also had difficulty moving her legs. Has some pain intermittently in her hands, both shoulders, and her chest that she describes as "feels like child birth". Does not complain of SOB but states she is "breathing under her breath", cannot clarify further what this means.   Review of Systems  Positive: Weakness, cramping, chest pain Negative: SOB, lightheadedness  Physical Exam  BP (!) 119/105 (BP Location: Left Arm)   Pulse (!) 122   Temp 98 F (36.7 C) (Oral)   Resp (!) 32   Ht 5\' 9"  (1.753 m)   Wt 77 kg   LMP 04/26/2012   SpO2 99%   BMI 25.07 kg/m  Gen:   Awake, no distress   Resp:  Normal effort  MSK:   Moves extremities without difficulty  Other:    Medical Decision Making  Medically screening exam initiated at 7:40 PM.  Appropriate orders placed.  Martha Buck was informed that the remainder of the evaluation will be completed by another provider, this initial triage assessment does not replace that evaluation, and the importance of remaining in the ED until their evaluation is complete.  Workup initiated. Pt seen for same sx in December and was discharged with no etiology identified   Su Monks, Cordelia Poche 11/08/23 1942

## 2023-11-08 NOTE — ED Provider Notes (Signed)
 [place] EMERGENCY DEPARTMENT AT [facility] Provider Note   CSN: [id number] Arrival date & time: 11/08/23  1905     History {Add pertinent medical, surgical, social history, OB history to HPI:1} Chief Complaint  Patient presents with   Cramping    Martha Buck is a 63 y.o. female.  The history is provided by the patient and medical records.   63 year old female with history of degenerative joint disease, depression, history of kidney stones, presenting to the ED with diffuse bodily cramping.  Patient reports she was doing some laundry and cleaning up her house today which normally does not cause her any issues.  States after getting the laundry out of the dryer and walking to the living room she felt like she was just give out.  She sat down the rest of her minute and felt like her hands were cramping up to where she could not open her fingers.  States then progressed to the rest of her body.  She called EMS who helped her onto the stretcher and states on the way here it started improving.  She reports history of same in December and was seen in the ER without known cause.  States since she has had her gallbladder out she only eats maybe once a day or so but she does try to drink plenty of fluids.  States recently she has had some nausea and some intermittent bloating sensation of her abdomen but not so much today.  Had normal bowel movement this morning.    Home Medications Prior to Admission medications   Medication Sig Start Date End Date Taking? Authorizing Provider  acetaminophen (TYLENOL) 500 MG tablet Take 2 tablets (1,000 mg total) by mouth every 6 (six) hours as needed for mild pain or fever. 02/17/22   Juliet Rude, PA-C  DULoxetine (CYMBALTA) 60 MG capsule Take 60 mg by mouth at bedtime. 01/25/22   [provider]  fluticasone (FLONASE) 50 MCG/ACT nasal spray Place 1-2 sprays into both nostrils daily. Patient taking differently: Place 1-2 sprays  into both nostrils daily as needed for allergies. 06/03/20   Wieters, Hallie C, PA-C  gabapentin (NEURONTIN) 400 MG capsule Take 2 capsules (800 mg total) by mouth 3 (three) times daily. Patient taking differently: Take 800 mg by mouth 2 (two) times daily. 02/17/22   Juliet Rude, PA-C  naloxone [facility]) nasal spray 4 mg/0.1 mL Place 1 spray into the nose once as needed (opiod overdose). 01/25/22   [provider]  oxyCODONE (ROXICODONE) 15 MG immediate release tablet Take 15 mg by mouth 4 (four) times daily as needed for severe pain. 01/25/22   [provider]  sodium chloride flush 0.9 % SOLN injection Use 5 mLs by Intracatheter route daily. Patient not taking: Reported on 04/30/2022 02/16/22   Han, Aimee H, PA-C      Allergies    Bee venom, Aspirin, Lyrica [pregabalin], and Morphine and codeine    Review of Systems   Review of Systems  Gastrointestinal:  Positive for nausea.  Musculoskeletal:  Positive for myalgias (cramping).  All other systems reviewed and are negative.   Physical Exam Updated Vital Signs BP 98/74 (BP Location: Left Arm)   Pulse 86   Temp 98 F (36.7 C) (Oral)   Resp 17   Ht 5\' 9"  (1.753 m)   Wt 77 kg   LMP 04/26/2012   SpO2 100%   BMI 25.07 kg/m   Physical Exam Vitals and nursing note reviewed.  Constitutional:      Appearance: She is well-developed.  HENT:     Head: Normocephalic and atraumatic.  Eyes:     Conjunctiva/sclera: Conjunctivae normal.     Pupils: Pupils are equal, round, and reactive to light.  Cardiovascular:     Rate and Rhythm: Normal rate and regular rhythm.     Heart sounds: Normal heart sounds.  Pulmonary:     Effort: Pulmonary effort is normal. No respiratory distress.     Breath sounds: Normal breath sounds. No rhonchi.  Abdominal:     General: Bowel sounds are normal.     Palpations: Abdomen is soft.     Tenderness: There is no abdominal tenderness.     Comments: Soft, does not seem overly distended, bowel  sounds seem normal  Musculoskeletal:        General: Normal range of motion.     Cervical back: Normal range of motion.     Comments: No contractures or involuntary cramping noted of the extremities, she is easily able to use cell phone without difficulty  Skin:    General: Skin is warm and dry.  Neurological:     Mental Status: She is alert and oriented to person, place, and time.     ED Results / Procedures / Treatments   Labs (all labs ordered are listed, but only abnormal results are displayed) Labs Reviewed  BASIC METABOLIC PANEL - Abnormal; Notable for the following components:      Result Value   Potassium 2.9 (*)    CO2 18 (*)    Glucose, Bld 133 (*)    Creatinine, Ser 1.01 (*)    All other components within normal limits  CBC - Abnormal; Notable for the following components:   WBC 11.3 (*)    All other components within normal limits  HEPATIC FUNCTION PANEL  LIPASE, BLOOD  CK  MAGNESIUM  TROPONIN I (HIGH SENSITIVITY)  TROPONIN I (HIGH SENSITIVITY)    EKG None  Radiology DG Chest 2 View Result Date: 11/08/2023 CLINICAL DATA:  Chest pain. EXAM: CHEST - 2 VIEW COMPARISON:  One-view chest x-ray 05/28/2023. FINDINGS: Heart is mildly enlarged. The lungs are clear. Gaseous distension of the stomach raises the left hemidiaphragm. Dilated loops of colon are noted as well. Visualized soft tissues and bony thorax are otherwise unremarkable. IMPRESSION: 1. Mild cardiomegaly without failure. 2. Gaseous distension of the stomach and dilated loops of colon. Bowel obstruction is not excluded. Recommend upright and supine abdominal radiographs or CT of the abdomen for further evaluation. Electronically Signed   By: Marin Roberts M.D.   On: 11/08/2023 20:08    Procedures Procedures  {Document cardiac monitor, telemetry assessment procedure when appropriate:1}  Medications Ordered in ED Medications  potassium chloride 10 mEq in 100 mL IVPB (has no administration in time  range)    ED Course/ Medical Decision Making/ A&P   {   Click here for ABCD2, HEART and other calculatorsREFRESH Note before signing :1}                              Medical Decision Making Amount and/or Complexity of Data Reviewed Labs: ordered. Radiology: ordered.  Risk Prescription drug management.   ***  {Document critical care time when appropriate:1} {Document review of labs and clinical decision tools ie heart score, Chads2Vasc2 etc:1}  {Document your independent review of radiology images, and any outside records:1} {Document your discussion with family members, caretakers,  and with consultants:1} {Document social determinants of health affecting pt's care:1} {Document your decision making why or why not admission, treatments were needed:1} Final Clinical Impression(s) / ED Diagnoses Final diagnoses:  None    Rx / DC Orders ED Discharge Orders     None

## 2023-11-08 NOTE — ED Notes (Signed)
 ED Provider at bedside.

## 2023-11-08 NOTE — ED Triage Notes (Addendum)
 Patient from home BIB [facility] w/ cramping that originated on left side of body which then spread to right side and then all over body. EMS stated that hands were contracted but patient able to move extremities. Per patient she has been hydrating appropriately. Patient visibly upset and yelling at staff. Denies chest pain, shortness of breath. Is equating the pain to giving child birth especially in the R side of her hip area. This pain comes and goes. Denies any recent traumas, does walk with a walker however is able to ambulate even with the pain.   BP 140/80  CBG 105

## 2023-11-08 NOTE — ED Notes (Signed)
 Pt refused 2nd troponin.  KM

## 2023-11-09 DIAGNOSIS — E876 Hypokalemia: Secondary | ICD-10-CM | POA: Diagnosis not present

## 2023-11-09 MED ORDER — POTASSIUM CHLORIDE CRYS ER 20 MEQ PO TBCR
40.0000 meq | EXTENDED_RELEASE_TABLET | Freq: Once | ORAL | Status: AC
  Administered 2023-11-09: 40 meq via ORAL
  Filled 2023-11-09: qty 2

## 2023-11-09 NOTE — Discharge Instructions (Signed)
 Your potassium was a little low today, this can cause some muscle cramping.  We have replaced this today. All other testing including abdominal scans were normal. Make sure to eat/drink well. Follow-up with your doctor. Return here for new concerns.

## 2024-02-01 ENCOUNTER — Other Ambulatory Visit (HOSPITAL_COMMUNITY): Payer: Self-pay | Admitting: Orthopedic Surgery

## 2024-02-01 DIAGNOSIS — T8484XA Pain due to internal orthopedic prosthetic devices, implants and grafts, initial encounter: Secondary | ICD-10-CM

## 2024-02-03 ENCOUNTER — Other Ambulatory Visit: Payer: Self-pay | Admitting: Family Medicine

## 2024-02-03 DIAGNOSIS — Z0000 Encounter for general adult medical examination without abnormal findings: Secondary | ICD-10-CM

## 2024-02-04 ENCOUNTER — Ambulatory Visit (HOSPITAL_COMMUNITY)
Admission: RE | Admit: 2024-02-04 | Discharge: 2024-02-04 | Disposition: A | Discharge status: Home / Self Care | Source: Ambulatory Visit | Attending: Orthopedic Surgery | Admitting: Orthopedic Surgery

## 2024-02-04 ENCOUNTER — Ambulatory Visit

## 2024-02-04 DIAGNOSIS — T8484XA Pain due to internal orthopedic prosthetic devices, implants and grafts, initial encounter: Secondary | ICD-10-CM | POA: Diagnosis present

## 2024-02-04 DIAGNOSIS — Z96651 Presence of right artificial knee joint: Secondary | ICD-10-CM | POA: Diagnosis present

## 2024-02-04 MED ORDER — TECHNETIUM TC 99M MEDRONATE IV KIT
21.0000 | PACK | Freq: Once | INTRAVENOUS | Status: AC | PRN
  Administered 2024-02-04: 21 via INTRAVENOUS

## 2024-02-11 ENCOUNTER — Ambulatory Visit
Admission: RE | Admit: 2024-02-11 | Discharge: 2024-02-11 | Disposition: A | Discharge status: Home / Self Care | Source: Ambulatory Visit | Attending: Family Medicine | Admitting: Family Medicine

## 2024-02-11 DIAGNOSIS — Z0000 Encounter for general adult medical examination without abnormal findings: Secondary | ICD-10-CM
